# Patient Record
Sex: Female | Born: 1935
Health system: Southern US, Community
[De-identification: ages and names within clinical notes are randomized; demographics above are authoritative.]

## PROBLEM LIST (undated history)

## (undated) DIAGNOSIS — Z8042 Family history of malignant neoplasm of prostate: Secondary | ICD-10-CM

## (undated) DIAGNOSIS — IMO0001 Reserved for inherently not codable concepts without codable children: Secondary | ICD-10-CM

## (undated) DIAGNOSIS — K274 Chronic or unspecified peptic ulcer, site unspecified, with hemorrhage: Secondary | ICD-10-CM

## (undated) DIAGNOSIS — M1990 Unspecified osteoarthritis, unspecified site: Secondary | ICD-10-CM

## (undated) DIAGNOSIS — C801 Malignant (primary) neoplasm, unspecified: Secondary | ICD-10-CM

## (undated) DIAGNOSIS — I10 Essential (primary) hypertension: Secondary | ICD-10-CM

## (undated) DIAGNOSIS — K284 Chronic or unspecified gastrojejunal ulcer with hemorrhage: Secondary | ICD-10-CM

## (undated) DIAGNOSIS — J189 Pneumonia, unspecified organism: Secondary | ICD-10-CM

## (undated) DIAGNOSIS — D649 Anemia, unspecified: Secondary | ICD-10-CM

## (undated) DIAGNOSIS — Z8489 Family history of other specified conditions: Secondary | ICD-10-CM

## (undated) DIAGNOSIS — Z803 Family history of malignant neoplasm of breast: Secondary | ICD-10-CM

## (undated) DIAGNOSIS — K219 Gastro-esophageal reflux disease without esophagitis: Secondary | ICD-10-CM

## (undated) DIAGNOSIS — C50919 Malignant neoplasm of unspecified site of unspecified female breast: Secondary | ICD-10-CM

## (undated) DIAGNOSIS — J45909 Unspecified asthma, uncomplicated: Secondary | ICD-10-CM

## (undated) DIAGNOSIS — C50411 Malignant neoplasm of upper-outer quadrant of right female breast: Secondary | ICD-10-CM

## (undated) DIAGNOSIS — A310 Pulmonary mycobacterial infection: Secondary | ICD-10-CM

## (undated) HISTORY — DX: Malignant neoplasm of unspecified site of unspecified female breast: C50.919

## (undated) HISTORY — PX: APPENDECTOMY: SHX54

## (undated) HISTORY — PX: ROTATOR CUFF REPAIR: SHX139

## (undated) HISTORY — DX: Family history of malignant neoplasm of breast: Z80.3

## (undated) HISTORY — PX: KNEE SURGERY: SHX244

## (undated) HISTORY — PX: ABDOMINAL HYSTERECTOMY: SHX81

## (undated) HISTORY — DX: Malignant neoplasm of upper-outer quadrant of right female breast: C50.411

## (undated) HISTORY — DX: Family history of malignant neoplasm of prostate: Z80.42

---

## 2009-04-15 ENCOUNTER — Ambulatory Visit (HOSPITAL_COMMUNITY)
Admission: RE | Admit: 2009-04-15 | Discharge: 2009-04-15 | Payer: Self-pay | Admitting: Physical Medicine and Rehabilitation

## 2009-04-15 ENCOUNTER — Encounter (INDEPENDENT_AMBULATORY_CARE_PROVIDER_SITE_OTHER): Payer: Self-pay | Admitting: Physical Medicine and Rehabilitation

## 2009-04-15 ENCOUNTER — Ambulatory Visit: Payer: Self-pay | Admitting: *Deleted

## 2010-02-17 ENCOUNTER — Ambulatory Visit (HOSPITAL_BASED_OUTPATIENT_CLINIC_OR_DEPARTMENT_OTHER): Admission: RE | Admit: 2010-02-17 | Discharge: 2010-02-17 | Payer: Self-pay | Admitting: Orthopedic Surgery

## 2010-12-13 LAB — POCT I-STAT 4, (NA,K, GLUC, HGB,HCT)
Glucose, Bld: 91 mg/dL (ref 70–99)
HCT: 41 % (ref 36.0–46.0)

## 2011-09-29 DIAGNOSIS — J410 Simple chronic bronchitis: Secondary | ICD-10-CM | POA: Diagnosis not present

## 2011-09-29 DIAGNOSIS — R5383 Other fatigue: Secondary | ICD-10-CM | POA: Diagnosis not present

## 2011-09-29 DIAGNOSIS — R5381 Other malaise: Secondary | ICD-10-CM | POA: Diagnosis not present

## 2011-12-26 DIAGNOSIS — H2510 Age-related nuclear cataract, unspecified eye: Secondary | ICD-10-CM | POA: Diagnosis not present

## 2012-01-09 DIAGNOSIS — Z1231 Encounter for screening mammogram for malignant neoplasm of breast: Secondary | ICD-10-CM | POA: Diagnosis not present

## 2012-01-31 DIAGNOSIS — J329 Chronic sinusitis, unspecified: Secondary | ICD-10-CM | POA: Diagnosis not present

## 2012-01-31 DIAGNOSIS — I10 Essential (primary) hypertension: Secondary | ICD-10-CM | POA: Diagnosis not present

## 2012-01-31 DIAGNOSIS — J309 Allergic rhinitis, unspecified: Secondary | ICD-10-CM | POA: Diagnosis not present

## 2012-02-20 DIAGNOSIS — H1030 Unspecified acute conjunctivitis, unspecified eye: Secondary | ICD-10-CM | POA: Diagnosis not present

## 2012-03-07 DIAGNOSIS — T148 Other injury of unspecified body region: Secondary | ICD-10-CM | POA: Diagnosis not present

## 2012-03-07 DIAGNOSIS — W57XXXA Bitten or stung by nonvenomous insect and other nonvenomous arthropods, initial encounter: Secondary | ICD-10-CM | POA: Diagnosis not present

## 2012-06-27 DIAGNOSIS — J209 Acute bronchitis, unspecified: Secondary | ICD-10-CM | POA: Diagnosis not present

## 2012-07-12 DIAGNOSIS — R062 Wheezing: Secondary | ICD-10-CM | POA: Diagnosis not present

## 2012-07-12 DIAGNOSIS — H5310 Unspecified subjective visual disturbances: Secondary | ICD-10-CM | POA: Diagnosis not present

## 2012-07-12 DIAGNOSIS — Z1331 Encounter for screening for depression: Secondary | ICD-10-CM | POA: Diagnosis not present

## 2012-07-12 DIAGNOSIS — Z23 Encounter for immunization: Secondary | ICD-10-CM | POA: Diagnosis not present

## 2012-07-23 DIAGNOSIS — H25019 Cortical age-related cataract, unspecified eye: Secondary | ICD-10-CM | POA: Diagnosis not present

## 2012-07-23 DIAGNOSIS — H2510 Age-related nuclear cataract, unspecified eye: Secondary | ICD-10-CM | POA: Diagnosis not present

## 2012-07-23 DIAGNOSIS — H5200 Hypermetropia, unspecified eye: Secondary | ICD-10-CM | POA: Diagnosis not present

## 2012-07-23 DIAGNOSIS — H40019 Open angle with borderline findings, low risk, unspecified eye: Secondary | ICD-10-CM | POA: Diagnosis not present

## 2012-08-31 DIAGNOSIS — Z23 Encounter for immunization: Secondary | ICD-10-CM | POA: Diagnosis not present

## 2012-08-31 DIAGNOSIS — I10 Essential (primary) hypertension: Secondary | ICD-10-CM | POA: Diagnosis not present

## 2012-08-31 DIAGNOSIS — K219 Gastro-esophageal reflux disease without esophagitis: Secondary | ICD-10-CM | POA: Diagnosis not present

## 2012-08-31 DIAGNOSIS — Z0000 Encounter for general adult medical examination without abnormal findings: Secondary | ICD-10-CM | POA: Diagnosis not present

## 2012-09-13 DIAGNOSIS — Z1212 Encounter for screening for malignant neoplasm of rectum: Secondary | ICD-10-CM | POA: Diagnosis not present

## 2012-09-26 HISTORY — PX: EYE SURGERY: SHX253

## 2012-11-01 DIAGNOSIS — L820 Inflamed seborrheic keratosis: Secondary | ICD-10-CM | POA: Diagnosis not present

## 2012-11-12 DIAGNOSIS — H60509 Unspecified acute noninfective otitis externa, unspecified ear: Secondary | ICD-10-CM | POA: Diagnosis not present

## 2012-12-05 DIAGNOSIS — R319 Hematuria, unspecified: Secondary | ICD-10-CM | POA: Diagnosis not present

## 2012-12-14 DIAGNOSIS — H25019 Cortical age-related cataract, unspecified eye: Secondary | ICD-10-CM | POA: Diagnosis not present

## 2012-12-14 DIAGNOSIS — H40019 Open angle with borderline findings, low risk, unspecified eye: Secondary | ICD-10-CM | POA: Diagnosis not present

## 2012-12-14 DIAGNOSIS — H2510 Age-related nuclear cataract, unspecified eye: Secondary | ICD-10-CM | POA: Diagnosis not present

## 2013-01-02 DIAGNOSIS — H2510 Age-related nuclear cataract, unspecified eye: Secondary | ICD-10-CM | POA: Diagnosis not present

## 2013-01-02 DIAGNOSIS — H2589 Other age-related cataract: Secondary | ICD-10-CM | POA: Diagnosis not present

## 2013-01-02 DIAGNOSIS — H25019 Cortical age-related cataract, unspecified eye: Secondary | ICD-10-CM | POA: Diagnosis not present

## 2013-01-05 DIAGNOSIS — IMO0002 Reserved for concepts with insufficient information to code with codable children: Secondary | ICD-10-CM | POA: Diagnosis not present

## 2013-01-09 DIAGNOSIS — H2510 Age-related nuclear cataract, unspecified eye: Secondary | ICD-10-CM | POA: Diagnosis not present

## 2013-01-09 DIAGNOSIS — H2589 Other age-related cataract: Secondary | ICD-10-CM | POA: Diagnosis not present

## 2013-01-09 DIAGNOSIS — H25019 Cortical age-related cataract, unspecified eye: Secondary | ICD-10-CM | POA: Diagnosis not present

## 2013-01-14 DIAGNOSIS — Z1231 Encounter for screening mammogram for malignant neoplasm of breast: Secondary | ICD-10-CM | POA: Diagnosis not present

## 2013-02-13 DIAGNOSIS — H9210 Otorrhea, unspecified ear: Secondary | ICD-10-CM | POA: Diagnosis not present

## 2013-02-13 DIAGNOSIS — H903 Sensorineural hearing loss, bilateral: Secondary | ICD-10-CM | POA: Diagnosis not present

## 2013-02-13 DIAGNOSIS — H60509 Unspecified acute noninfective otitis externa, unspecified ear: Secondary | ICD-10-CM | POA: Diagnosis not present

## 2013-02-22 DIAGNOSIS — H9210 Otorrhea, unspecified ear: Secondary | ICD-10-CM | POA: Diagnosis not present

## 2013-02-22 DIAGNOSIS — H60509 Unspecified acute noninfective otitis externa, unspecified ear: Secondary | ICD-10-CM | POA: Diagnosis not present

## 2013-02-28 DIAGNOSIS — H40019 Open angle with borderline findings, low risk, unspecified eye: Secondary | ICD-10-CM | POA: Diagnosis not present

## 2013-02-28 DIAGNOSIS — H532 Diplopia: Secondary | ICD-10-CM | POA: Diagnosis not present

## 2013-03-07 DIAGNOSIS — M25569 Pain in unspecified knee: Secondary | ICD-10-CM | POA: Diagnosis not present

## 2013-03-07 DIAGNOSIS — I10 Essential (primary) hypertension: Secondary | ICD-10-CM | POA: Diagnosis not present

## 2013-03-07 DIAGNOSIS — R5381 Other malaise: Secondary | ICD-10-CM | POA: Diagnosis not present

## 2013-03-07 DIAGNOSIS — Z6835 Body mass index (BMI) 35.0-35.9, adult: Secondary | ICD-10-CM | POA: Diagnosis not present

## 2013-03-07 DIAGNOSIS — F4321 Adjustment disorder with depressed mood: Secondary | ICD-10-CM | POA: Diagnosis not present

## 2013-03-07 DIAGNOSIS — K219 Gastro-esophageal reflux disease without esophagitis: Secondary | ICD-10-CM | POA: Diagnosis not present

## 2013-05-06 DIAGNOSIS — J309 Allergic rhinitis, unspecified: Secondary | ICD-10-CM | POA: Diagnosis not present

## 2013-05-06 DIAGNOSIS — J209 Acute bronchitis, unspecified: Secondary | ICD-10-CM | POA: Diagnosis not present

## 2013-07-08 DIAGNOSIS — Z23 Encounter for immunization: Secondary | ICD-10-CM | POA: Diagnosis not present

## 2013-07-16 DIAGNOSIS — Z1331 Encounter for screening for depression: Secondary | ICD-10-CM | POA: Diagnosis not present

## 2013-07-16 DIAGNOSIS — Z6834 Body mass index (BMI) 34.0-34.9, adult: Secondary | ICD-10-CM | POA: Diagnosis not present

## 2013-07-16 DIAGNOSIS — F411 Generalized anxiety disorder: Secondary | ICD-10-CM | POA: Diagnosis not present

## 2013-07-16 DIAGNOSIS — H60399 Other infective otitis externa, unspecified ear: Secondary | ICD-10-CM | POA: Diagnosis not present

## 2013-07-16 DIAGNOSIS — M1990 Unspecified osteoarthritis, unspecified site: Secondary | ICD-10-CM | POA: Diagnosis not present

## 2013-07-16 DIAGNOSIS — R634 Abnormal weight loss: Secondary | ICD-10-CM | POA: Diagnosis not present

## 2013-07-16 DIAGNOSIS — I10 Essential (primary) hypertension: Secondary | ICD-10-CM | POA: Diagnosis not present

## 2013-09-03 DIAGNOSIS — M1710 Unilateral primary osteoarthritis, unspecified knee: Secondary | ICD-10-CM | POA: Diagnosis not present

## 2013-09-12 DIAGNOSIS — L981 Factitial dermatitis: Secondary | ICD-10-CM | POA: Diagnosis not present

## 2013-09-12 DIAGNOSIS — L219 Seborrheic dermatitis, unspecified: Secondary | ICD-10-CM | POA: Diagnosis not present

## 2013-10-02 DIAGNOSIS — I10 Essential (primary) hypertension: Secondary | ICD-10-CM | POA: Diagnosis not present

## 2013-10-15 DIAGNOSIS — M1710 Unilateral primary osteoarthritis, unspecified knee: Secondary | ICD-10-CM | POA: Diagnosis not present

## 2013-10-15 DIAGNOSIS — IMO0002 Reserved for concepts with insufficient information to code with codable children: Secondary | ICD-10-CM | POA: Diagnosis not present

## 2013-10-17 DIAGNOSIS — L719 Rosacea, unspecified: Secondary | ICD-10-CM | POA: Diagnosis not present

## 2013-10-17 DIAGNOSIS — L820 Inflamed seborrheic keratosis: Secondary | ICD-10-CM | POA: Diagnosis not present

## 2013-10-24 DIAGNOSIS — Z1212 Encounter for screening for malignant neoplasm of rectum: Secondary | ICD-10-CM | POA: Diagnosis not present

## 2013-10-24 DIAGNOSIS — IMO0002 Reserved for concepts with insufficient information to code with codable children: Secondary | ICD-10-CM | POA: Diagnosis not present

## 2013-10-24 DIAGNOSIS — M1710 Unilateral primary osteoarthritis, unspecified knee: Secondary | ICD-10-CM | POA: Diagnosis not present

## 2013-10-28 DIAGNOSIS — Z0000 Encounter for general adult medical examination without abnormal findings: Secondary | ICD-10-CM | POA: Diagnosis not present

## 2013-10-28 DIAGNOSIS — R5381 Other malaise: Secondary | ICD-10-CM | POA: Diagnosis not present

## 2013-10-28 DIAGNOSIS — F4321 Adjustment disorder with depressed mood: Secondary | ICD-10-CM | POA: Diagnosis not present

## 2013-10-28 DIAGNOSIS — K219 Gastro-esophageal reflux disease without esophagitis: Secondary | ICD-10-CM | POA: Diagnosis not present

## 2013-10-28 DIAGNOSIS — R634 Abnormal weight loss: Secondary | ICD-10-CM | POA: Diagnosis not present

## 2013-10-28 DIAGNOSIS — I10 Essential (primary) hypertension: Secondary | ICD-10-CM | POA: Diagnosis not present

## 2013-10-28 DIAGNOSIS — E46 Unspecified protein-calorie malnutrition: Secondary | ICD-10-CM | POA: Diagnosis not present

## 2013-10-28 DIAGNOSIS — R319 Hematuria, unspecified: Secondary | ICD-10-CM | POA: Diagnosis not present

## 2013-10-28 DIAGNOSIS — R5383 Other fatigue: Secondary | ICD-10-CM | POA: Diagnosis not present

## 2013-10-28 DIAGNOSIS — Z23 Encounter for immunization: Secondary | ICD-10-CM | POA: Diagnosis not present

## 2013-10-31 DIAGNOSIS — IMO0002 Reserved for concepts with insufficient information to code with codable children: Secondary | ICD-10-CM | POA: Diagnosis not present

## 2013-10-31 DIAGNOSIS — M1710 Unilateral primary osteoarthritis, unspecified knee: Secondary | ICD-10-CM | POA: Diagnosis not present

## 2013-12-08 DIAGNOSIS — J019 Acute sinusitis, unspecified: Secondary | ICD-10-CM | POA: Diagnosis not present

## 2013-12-08 DIAGNOSIS — J209 Acute bronchitis, unspecified: Secondary | ICD-10-CM | POA: Diagnosis not present

## 2014-01-16 DIAGNOSIS — L819 Disorder of pigmentation, unspecified: Secondary | ICD-10-CM | POA: Diagnosis not present

## 2014-01-16 DIAGNOSIS — L821 Other seborrheic keratosis: Secondary | ICD-10-CM | POA: Diagnosis not present

## 2014-01-27 DIAGNOSIS — A938 Other specified arthropod-borne viral fevers: Secondary | ICD-10-CM | POA: Diagnosis not present

## 2014-01-30 DIAGNOSIS — Z1231 Encounter for screening mammogram for malignant neoplasm of breast: Secondary | ICD-10-CM | POA: Diagnosis not present

## 2014-02-07 DIAGNOSIS — M1710 Unilateral primary osteoarthritis, unspecified knee: Secondary | ICD-10-CM | POA: Diagnosis not present

## 2014-03-10 DIAGNOSIS — H3530 Unspecified macular degeneration: Secondary | ICD-10-CM | POA: Diagnosis not present

## 2014-03-10 DIAGNOSIS — H40019 Open angle with borderline findings, low risk, unspecified eye: Secondary | ICD-10-CM | POA: Diagnosis not present

## 2014-03-10 DIAGNOSIS — H43819 Vitreous degeneration, unspecified eye: Secondary | ICD-10-CM | POA: Diagnosis not present

## 2014-03-10 DIAGNOSIS — H04129 Dry eye syndrome of unspecified lacrimal gland: Secondary | ICD-10-CM | POA: Diagnosis not present

## 2014-04-16 DIAGNOSIS — M1710 Unilateral primary osteoarthritis, unspecified knee: Secondary | ICD-10-CM | POA: Diagnosis not present

## 2014-04-22 DIAGNOSIS — Z85828 Personal history of other malignant neoplasm of skin: Secondary | ICD-10-CM | POA: Diagnosis not present

## 2014-04-22 DIAGNOSIS — L821 Other seborrheic keratosis: Secondary | ICD-10-CM | POA: Diagnosis not present

## 2014-04-24 ENCOUNTER — Other Ambulatory Visit: Payer: Self-pay | Admitting: Orthopedic Surgery

## 2014-05-12 ENCOUNTER — Emergency Department (HOSPITAL_COMMUNITY): Payer: No Typology Code available for payment source

## 2014-05-12 ENCOUNTER — Encounter (HOSPITAL_COMMUNITY): Payer: Self-pay | Admitting: Emergency Medicine

## 2014-05-12 ENCOUNTER — Emergency Department (HOSPITAL_COMMUNITY)
Admission: EM | Admit: 2014-05-12 | Discharge: 2014-05-12 | Disposition: A | Discharge status: Home / Self Care | Payer: No Typology Code available for payment source | Attending: Emergency Medicine | Admitting: Emergency Medicine

## 2014-05-12 ENCOUNTER — Other Ambulatory Visit (HOSPITAL_COMMUNITY): Payer: Self-pay

## 2014-05-12 DIAGNOSIS — Y9389 Activity, other specified: Secondary | ICD-10-CM | POA: Insufficient documentation

## 2014-05-12 DIAGNOSIS — S8990XA Unspecified injury of unspecified lower leg, initial encounter: Secondary | ICD-10-CM | POA: Insufficient documentation

## 2014-05-12 DIAGNOSIS — Y92410 Unspecified street and highway as the place of occurrence of the external cause: Secondary | ICD-10-CM | POA: Insufficient documentation

## 2014-05-12 DIAGNOSIS — S0993XA Unspecified injury of face, initial encounter: Secondary | ICD-10-CM | POA: Insufficient documentation

## 2014-05-12 DIAGNOSIS — S161XXA Strain of muscle, fascia and tendon at neck level, initial encounter: Secondary | ICD-10-CM

## 2014-05-12 DIAGNOSIS — Z79899 Other long term (current) drug therapy: Secondary | ICD-10-CM | POA: Insufficient documentation

## 2014-05-12 DIAGNOSIS — I10 Essential (primary) hypertension: Secondary | ICD-10-CM | POA: Insufficient documentation

## 2014-05-12 DIAGNOSIS — S99919A Unspecified injury of unspecified ankle, initial encounter: Secondary | ICD-10-CM

## 2014-05-12 DIAGNOSIS — S199XXA Unspecified injury of neck, initial encounter: Secondary | ICD-10-CM

## 2014-05-12 DIAGNOSIS — S99929A Unspecified injury of unspecified foot, initial encounter: Secondary | ICD-10-CM

## 2014-05-12 DIAGNOSIS — M25569 Pain in unspecified knee: Secondary | ICD-10-CM | POA: Diagnosis not present

## 2014-05-12 DIAGNOSIS — S0990XA Unspecified injury of head, initial encounter: Secondary | ICD-10-CM | POA: Diagnosis not present

## 2014-05-12 DIAGNOSIS — K219 Gastro-esophageal reflux disease without esophagitis: Secondary | ICD-10-CM | POA: Insufficient documentation

## 2014-05-12 DIAGNOSIS — S139XXA Sprain of joints and ligaments of unspecified parts of neck, initial encounter: Secondary | ICD-10-CM | POA: Insufficient documentation

## 2014-05-12 DIAGNOSIS — R51 Headache: Secondary | ICD-10-CM | POA: Diagnosis not present

## 2014-05-12 DIAGNOSIS — IMO0002 Reserved for concepts with insufficient information to code with codable children: Secondary | ICD-10-CM | POA: Insufficient documentation

## 2014-05-12 HISTORY — DX: Gastro-esophageal reflux disease without esophagitis: K21.9

## 2014-05-12 HISTORY — DX: Essential (primary) hypertension: I10
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
## 2014-05-13 ENCOUNTER — Encounter (HOSPITAL_COMMUNITY)
Admission: RE | Admit: 2014-05-13 | Discharge: 2014-05-13 | Disposition: A | Discharge status: Home / Self Care | Payer: Medicare Other | Source: Ambulatory Visit | Attending: Orthopedic Surgery | Admitting: Orthopedic Surgery

## 2014-05-13 ENCOUNTER — Ambulatory Visit
Admission: RE | Admit: 2014-05-13 | Discharge: 2014-05-13 | Disposition: A | Discharge status: Home / Self Care | Payer: Medicare Other | Source: Ambulatory Visit | Attending: Internal Medicine | Admitting: Internal Medicine

## 2014-05-13 ENCOUNTER — Encounter (HOSPITAL_COMMUNITY): Payer: Self-pay | Admitting: Pharmacy Technician

## 2014-05-13 ENCOUNTER — Other Ambulatory Visit: Payer: Self-pay | Admitting: Internal Medicine

## 2014-05-13 ENCOUNTER — Encounter (HOSPITAL_COMMUNITY): Payer: Self-pay

## 2014-05-13 DIAGNOSIS — R911 Solitary pulmonary nodule: Secondary | ICD-10-CM

## 2014-05-13 DIAGNOSIS — M1710 Unilateral primary osteoarthritis, unspecified knee: Secondary | ICD-10-CM | POA: Insufficient documentation

## 2014-05-13 DIAGNOSIS — Z01818 Encounter for other preprocedural examination: Secondary | ICD-10-CM | POA: Diagnosis not present

## 2014-05-13 DIAGNOSIS — J479 Bronchiectasis, uncomplicated: Secondary | ICD-10-CM | POA: Diagnosis not present

## 2014-05-13 HISTORY — DX: Unspecified osteoarthritis, unspecified site: M19.90

## 2014-05-13 HISTORY — DX: Malignant (primary) neoplasm, unspecified: C80.1

## 2014-05-13 HISTORY — DX: Unspecified asthma, uncomplicated: J45.909

## 2014-05-13 LAB — COMPREHENSIVE METABOLIC PANEL
ALBUMIN: 3.9 g/dL (ref 3.5–5.2)
ALK PHOS: 119 U/L — AB (ref 39–117)
ALT: 16 U/L (ref 0–35)
AST: 23 U/L (ref 0–37)
Anion gap: 13 (ref 5–15)
BUN: 22 mg/dL (ref 6–23)
CO2: 27 mEq/L (ref 19–32)
Calcium: 9.7 mg/dL (ref 8.4–10.5)
Chloride: 98 mEq/L (ref 96–112)
Creatinine, Ser: 0.76 mg/dL (ref 0.50–1.10)
GFR calc Af Amer: 86 mL/min — ABNORMAL LOW (ref >90)
GFR calc non Af Amer: 74 mL/min — ABNORMAL LOW (ref >90)
Glucose, Bld: 98 mg/dL (ref 70–99)
POTASSIUM: 4 meq/L (ref 3.7–5.3)
SODIUM: 138 meq/L (ref 137–147)
Total Bilirubin: 0.4 mg/dL (ref 0.3–1.2)
Total Protein: 7.4 g/dL (ref 6.0–8.3)

## 2014-05-13 LAB — CBC
HCT: 41.7 % (ref 36.0–46.0)
Hemoglobin: 13.6 g/dL (ref 12.0–15.0)
MCH: 32.9 pg (ref 26.0–34.0)
MCHC: 32.6 g/dL (ref 30.0–36.0)
MCV: 100.7 fL — ABNORMAL HIGH (ref 78.0–100.0)
Platelets: 409 10*3/uL — ABNORMAL HIGH (ref 150–400)
RBC: 4.14 MIL/uL (ref 3.87–5.11)
RDW: 13.6 % (ref 11.5–15.5)
WBC: 11 10*3/uL — ABNORMAL HIGH (ref 4.0–10.5)

## 2014-05-13 LAB — URINALYSIS, ROUTINE W REFLEX MICROSCOPIC
BILIRUBIN URINE: NEGATIVE
Glucose, UA: NEGATIVE mg/dL
Hgb urine dipstick: NEGATIVE
Ketones, ur: NEGATIVE mg/dL
Leukocytes, UA: NEGATIVE
Nitrite: NEGATIVE
Protein, ur: NEGATIVE mg/dL
Specific Gravity, Urine: 1.014 (ref 1.005–1.030)
UROBILINOGEN UA: 0.2 mg/dL (ref 0.0–1.0)
pH: 5.5 (ref 5.0–8.0)

## 2014-05-13 LAB — SURGICAL PCR SCREEN
MRSA, PCR: POSITIVE — AB
Staphylococcus aureus: POSITIVE — AB

## 2014-05-13 LAB — PROTIME-INR
INR: 1.08 (ref 0.00–1.49)
Prothrombin Time: 14 seconds (ref 11.6–15.2)

## 2014-05-13 LAB — ABO/RH: ABO/RH(D): O POS

## 2014-05-13 LAB — APTT: aPTT: 38 seconds — ABNORMAL HIGH (ref 24–37)

## 2014-05-13 MED ORDER — IOHEXOL 300 MG/ML  SOLN
75.0000 mL | Freq: Once | INTRAMUSCULAR | Status: AC | PRN
  Administered 2014-05-13: 75 mL via INTRAVENOUS

## 2014-05-13 NOTE — Patient Instructions (Addendum)
YOUR SURGERY IS SCHEDULED AT [facility]  ON:  Monday  8/24  REPORT TO  SHORT STAY CENTER AT:  10:45 AM     PLEASE COME IN THE [place] ENTRANCE AND FOLLOW SIGNS TO SHORT STAY CENTER.  DO NOT EAT  ANYTHING AFTER MIDNIGHT THE NIGHT BEFORE YOUR SURGERY.  NO FOOD, NO CHEWING GUM, NO MINTS, NO CANDIES, NO CHEWING TOBACCO. YOU MAY HAVE CLEAR LIQUIDS TO DRINK FROM MIDNIGHT UNTIL 7:30 AM DAY OF YOUR SURGERY - LIKE WATER, GATORADE, APPLE JUICE.   NOTHING TO DRINK AFTER 7:30 AM DAY OF SURGERY.  PLEASE TAKE THE FOLLOWING MEDICATIONS THE AM OF YOUR SURGERY WITH A FEW SIPS OF WATER:  [place], [place].  USE ALBUTEROL INHALER, AND FLONASE NASAL SPRAY   DO NOT BRING VALUABLES, MONEY, CREDIT CARDS.  DO NOT WEAR JEWELRY, MAKE-UP, NAIL POLISH AND NO METAL PINS OR CLIPS IN YOUR HAIR. CONTACT LENS, DENTURES / PARTIALS, GLASSES SHOULD NOT BE WORN TO SURGERY AND IN MOST CASES-HEARING AIDS WILL NEED TO BE REMOVED.  BRING YOUR GLASSES CASE, ANY EQUIPMENT NEEDED FOR YOUR CONTACT LENS. FOR PATIENTS ADMITTED TO THE HOSPITAL--CHECK OUT TIME THE DAY OF DISCHARGE IS 11:00 AM.  ALL INPATIENT ROOMS ARE PRIVATE - WITH BATHROOM, TELEPHONE, TELEVISION AND WIFI INTERNET.                                                    PLEASE READ OVER ANY  FACT SHEETS THAT YOU WERE GIVEN: MRSA INFORMATION, BLOOD TRANSFUSION INFORMATION, INCENTIVE SPIROMETER INFORMATION.  PLEASE BE AWARE THAT YOU MAY NEED ADDITIONAL BLOOD DRAWN DAY OF YOUR SURGERY  _______________________________________________________________________   [facility] - Preparing for Surgery Before surgery, you can play an important role.  Because skin is not sterile, your skin needs to be as free of germs as possible.  You can reduce the number of germs on your skin by washing with CHG (chlorahexidine gluconate) soap before surgery.  CHG is an antiseptic cleaner which kills germs and bonds with the skin to continue killing germs even after  washing. Please DO NOT use if you have an allergy to CHG or antibacterial soaps.  If your skin becomes reddened/irritated stop using the CHG and inform your nurse when you arrive at Short Stay. Do not shave (including legs and underarms) for at least 48 hours prior to the first CHG shower.  You may shave your face/neck. Please follow these instructions carefully:  1.  Shower with CHG Soap the night before surgery and the  morning of Surgery.  2.  If you choose to wash your hair, wash your hair first as usual with your  normal  shampoo.  3.  After you shampoo, rinse your hair and body thoroughly to remove the  shampoo.                           4.  Use CHG as you would any other liquid soap.  You can apply chg directly  to the skin and wash                       Gently with a scrungie or clean washcloth.  5.  Apply the CHG Soap to your body ONLY FROM THE NECK DOWN.   Do not use  on face/ open                           Wound or open sores. Avoid contact with eyes, ears mouth and genitals (private parts).                       Wash face,  Genitals (private parts) with your normal soap.             6.  Wash thoroughly, paying special attention to the area where your surgery  will be performed.  7.  Thoroughly rinse your body with warm water from the neck down.  8.  DO NOT shower/wash with your normal soap after using and rinsing off  the CHG Soap.                9.  Pat yourself dry with a clean towel.            10.  Wear clean pajamas.            11.  Place clean sheets on your bed the night of your first shower and do not  sleep with pets. Day of Surgery : Do not apply any lotions/deodorants the morning of surgery.  Please wear clean clothes to the hospital/surgery center.  FAILURE TO FOLLOW THESE INSTRUCTIONS MAY RESULT IN THE CANCELLATION OF YOUR SURGERY PATIENT SIGNATURE_________________________________  NURSE  SIGNATURE__________________________________  ________________________________________________________________________   Carla [name]  An incentive spirometer is a tool that can help keep your lungs clear and active. This tool measures how well you are filling your lungs with each breath. Taking long deep breaths may help reverse or decrease the chance of developing breathing (pulmonary) problems (especially infection) following:  A long period of time when you are unable to move or be active. BEFORE THE PROCEDURE   If the spirometer includes an indicator to show your best effort, your nurse or respiratory therapist will set it to a desired goal.  If possible, sit up straight or lean slightly forward. Try not to slouch.  Hold the incentive spirometer in an upright position. INSTRUCTIONS FOR USE  1. Sit on the edge of your bed if possible, or sit up as far as you can in bed or on a chair. 2. Hold the incentive spirometer in an upright position. 3. Breathe out normally. 4. Place the mouthpiece in your mouth and seal your lips tightly around it. 5. Breathe in slowly and as deeply as possible, raising the piston or the ball toward the top of the column. 6. Hold your breath for 3-5 seconds or for as long as possible. Allow the piston or ball to fall to the bottom of the column. 7. Remove the mouthpiece from your mouth and breathe out normally. 8. Rest for a few seconds and repeat Steps 1 through 7 at least 10 times every 1-2 hours when you are awake. Take your time and take a few normal breaths between deep breaths. 9. The spirometer may include an indicator to show your best effort. Use the indicator as a goal to work toward during each repetition. 10. After each set of 10 deep breaths, practice coughing to be sure your lungs are clear. If you have an incision (the cut made at the time of surgery), support your incision when coughing by placing a pillow or rolled up towels firmly  against it. Once you are able to get out of bed, walk  around indoors and cough well. You may stop using the incentive spirometer when instructed by your caregiver.  RISKS AND COMPLICATIONS  Take your time so you do not get dizzy or light-headed.  If you are in pain, you may need to take or ask for pain medication before doing incentive spirometry. It is harder to take a deep breath if you are having pain. AFTER USE  Rest and breathe slowly and easily.  It can be helpful to keep track of a log of your progress. Your caregiver can provide you with a simple table to help with this. If you are using the spirometer at home, follow these instructions: [place] IF:   You are having difficultly using the spirometer.  You have trouble using the spirometer as often as instructed.  Your pain medication is not giving enough relief while using the spirometer.  You develop fever of 100.5 F (38.1 C) or higher. SEEK IMMEDIATE MEDICAL CARE IF:   You cough up bloody sputum that had not been present before.  You develop fever of 102 F (38.9 C) or greater.  You develop worsening pain at or near the incision site. MAKE SURE YOU:   Understand these instructions.  Will watch your condition.  Will get help right away if you are not doing well or get worse. Document Released: 01/23/2007 Document Revised: 12/05/2011 Document Reviewed: 03/26/2007 ExitCare Patient Information 2014 Exit[employer], [place].   ________________________________________________________________________  WHAT IS A BLOOD TRANSFUSION? Blood Transfusion Information  A transfusion is the replacement of blood or some of its parts. Blood is made up of multiple cells which provide different functions.  Red blood cells carry oxygen and are used for blood loss replacement.  White blood cells fight against infection.  Platelets control bleeding.  Plasma helps clot blood.  Other blood products are available for  specialized needs, such as hemophilia or other clotting disorders. BEFORE THE TRANSFUSION  Who gives blood for transfusions?   Healthy volunteers who are fully evaluated to make sure their blood is safe. This is blood bank blood. Transfusion therapy is the safest it has ever been in the practice of medicine. Before blood is taken from a donor, a complete history is taken to make sure that person has no history of diseases nor engages in risky social behavior (examples are intravenous drug use or sexual activity with multiple partners). The donor's travel history is screened to minimize risk of transmitting infections, such as malaria. The donated blood is tested for signs of infectious diseases, such as HIV and hepatitis. The blood is then tested to be sure it is compatible with you in order to minimize the chance of a transfusion reaction. If you or a relative donates blood, this is often done in anticipation of surgery and is not appropriate for emergency situations. It takes many days to process the donated blood. RISKS AND COMPLICATIONS Although transfusion therapy is very safe and saves many lives, the main dangers of transfusion include:   Getting an infectious disease.  Developing a transfusion reaction. This is an allergic reaction to something in the blood you were given. Every precaution is taken to prevent this. The decision to have a blood transfusion has been considered carefully by your caregiver before blood is given. Blood is not given unless the benefits outweigh the risks. AFTER THE TRANSFUSION  Right after receiving a blood transfusion, you will usually feel much better and more energetic. This is especially true if your red blood cells have gotten  low (anemic). The transfusion raises the level of the red blood cells which carry oxygen, and this usually causes an energy increase.  The nurse administering the transfusion will monitor you carefully for complications. HOME CARE  INSTRUCTIONS  No special instructions are needed after a transfusion. You may find your energy is better. Speak with your caregiver about any limitations on activity for underlying diseases you may have. SEEK MEDICAL CARE IF:   Your condition is not improving after your transfusion.  You develop redness or irritation at the intravenous (IV) site. SEEK IMMEDIATE MEDICAL CARE IF:  Any of the following symptoms occur over the next 12 hours:  Shaking chills.  You have a temperature by mouth above 102 F (38.9 C), not controlled by medicine.  Chest, back, or muscle pain.  People around you feel you are not acting correctly or are confused.  Shortness of breath or difficulty breathing.  Dizziness and fainting.  You get a rash or develop hives.  You have a decrease in urine output.  Your urine turns a dark color or changes to pink, red, or [place]. Any of the following symptoms occur over the next 10 days:  You have a temperature by mouth above 102 F (38.9 C), not controlled by medicine.  Shortness of breath.  Weakness after normal activity.  The white part of the eye turns yellow (jaundice).  You have a decrease in the amount of urine or are urinating less often.  Your urine turns a dark color or changes to pink, red, or [place]. Document Released: 09/09/2000 Document Revised: 12/05/2011 Document Reviewed: 04/28/2008 [facility] Patient Information 2014 [place], [place].  _______________________________________________________________________
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
## 2014-05-13 NOTE — Pre-Procedure Instructions (Signed)
EKG ON PT'S CHART FROM 10/28/13 [facility] AND OFFICE NOTE ON CHART FROM 05/05/14 WITH  INTERPRETATION OF THE EKG DONE 10/28/13 FROM DR. HOLWERDA. PT HAVING CT OF CHEST THIS AFTERNOON AT [facility].

## 2014-05-14 ENCOUNTER — Other Ambulatory Visit: Payer: Self-pay | Admitting: Surgical

## 2014-05-14 MED ORDER — TRANEXAMIC ACID 100 MG/ML IV SOLN: 1000.0000 mg | INTRAVENOUS | Status: AC
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
## 2014-05-19 ENCOUNTER — Encounter (HOSPITAL_COMMUNITY): Payer: Self-pay | Admitting: *Deleted

## 2014-05-19 ENCOUNTER — Encounter (HOSPITAL_COMMUNITY)
Admission: RE | Disposition: A | Discharge status: Skilled Nursing Facility | Payer: Self-pay | Source: Ambulatory Visit | Attending: Orthopedic Surgery

## 2014-05-19 ENCOUNTER — Inpatient Hospital Stay (HOSPITAL_COMMUNITY)
Admission: RE | Admit: 2014-05-19 | Discharge: 2014-05-22 | DRG: 470 | Disposition: A | Discharge status: Skilled Nursing Facility | Payer: Medicare Other | Source: Ambulatory Visit | Attending: Orthopedic Surgery | Admitting: Orthopedic Surgery

## 2014-05-19 ENCOUNTER — Encounter (HOSPITAL_COMMUNITY): Payer: Medicare Other | Admitting: Anesthesiology

## 2014-05-19 ENCOUNTER — Inpatient Hospital Stay (HOSPITAL_COMMUNITY): Payer: Medicare Other | Admitting: Anesthesiology

## 2014-05-19 DIAGNOSIS — J45909 Unspecified asthma, uncomplicated: Secondary | ICD-10-CM | POA: Diagnosis not present

## 2014-05-19 DIAGNOSIS — M179 Osteoarthritis of knee, unspecified: Secondary | ICD-10-CM | POA: Diagnosis present

## 2014-05-19 DIAGNOSIS — M1990 Unspecified osteoarthritis, unspecified site: Secondary | ICD-10-CM | POA: Diagnosis not present

## 2014-05-19 DIAGNOSIS — Z79899 Other long term (current) drug therapy: Secondary | ICD-10-CM | POA: Diagnosis not present

## 2014-05-19 DIAGNOSIS — IMO0002 Reserved for concepts with insufficient information to code with codable children: Secondary | ICD-10-CM | POA: Diagnosis not present

## 2014-05-19 DIAGNOSIS — Z96651 Presence of right artificial knee joint: Secondary | ICD-10-CM

## 2014-05-19 DIAGNOSIS — I10 Essential (primary) hypertension: Secondary | ICD-10-CM | POA: Diagnosis not present

## 2014-05-19 DIAGNOSIS — K219 Gastro-esophageal reflux disease without esophagitis: Secondary | ICD-10-CM | POA: Diagnosis not present

## 2014-05-19 DIAGNOSIS — Z96659 Presence of unspecified artificial knee joint: Secondary | ICD-10-CM | POA: Diagnosis not present

## 2014-05-19 DIAGNOSIS — J301 Allergic rhinitis due to pollen: Secondary | ICD-10-CM | POA: Diagnosis not present

## 2014-05-19 DIAGNOSIS — M1710 Unilateral primary osteoarthritis, unspecified knee: Secondary | ICD-10-CM | POA: Diagnosis not present

## 2014-05-19 DIAGNOSIS — M1711 Unilateral primary osteoarthritis, right knee: Secondary | ICD-10-CM

## 2014-05-19 DIAGNOSIS — Z01812 Encounter for preprocedural laboratory examination: Secondary | ICD-10-CM | POA: Diagnosis not present

## 2014-05-19 DIAGNOSIS — Z471 Aftercare following joint replacement surgery: Secondary | ICD-10-CM | POA: Diagnosis not present

## 2014-05-19 DIAGNOSIS — Z85828 Personal history of other malignant neoplasm of skin: Secondary | ICD-10-CM | POA: Diagnosis not present

## 2014-05-19 DIAGNOSIS — M25569 Pain in unspecified knee: Secondary | ICD-10-CM | POA: Diagnosis not present

## 2014-05-19 DIAGNOSIS — M25519 Pain in unspecified shoulder: Secondary | ICD-10-CM | POA: Diagnosis not present

## 2014-05-19 DIAGNOSIS — C44519 Basal cell carcinoma of skin of other part of trunk: Secondary | ICD-10-CM | POA: Diagnosis not present

## 2014-05-19 HISTORY — PX: TOTAL KNEE ARTHROPLASTY: SHX125

## 2014-05-19 LAB — TYPE AND SCREEN
ABO/RH(D): O POS
ANTIBODY SCREEN: NEGATIVE

## 2014-05-19 SURGERY — ARTHROPLASTY, KNEE, TOTAL
Anesthesia: Spinal | Site: Knee | Laterality: Right

## 2014-05-19 MED ORDER — ONDANSETRON HCL 4 MG/2ML IJ SOLN: 4.0000 mg | Freq: Four times a day (QID) | INTRAMUSCULAR | Status: DC | PRN

## 2014-05-19 MED ORDER — DEXAMETHASONE SODIUM PHOSPHATE 10 MG/ML IJ SOLN
10.0000 mg | Freq: Every day | INTRAMUSCULAR | Status: AC
  Filled 2014-05-19: qty 1

## 2014-05-19 MED ORDER — BUPIVACAINE HCL (PF) 0.25 % IJ SOLN
INTRAMUSCULAR | Status: AC
  Filled 2014-05-19: qty 30

## 2014-05-19 MED ORDER — LACTATED RINGERS IV SOLN: INTRAVENOUS | Status: DC

## 2014-05-19 MED ORDER — ALBUTEROL SULFATE (2.5 MG/3ML) 0.083% IN NEBU: 2.5000 mg | INHALATION_SOLUTION | Freq: Four times a day (QID) | RESPIRATORY_TRACT | Status: DC | PRN

## 2014-05-19 MED ORDER — VANCOMYCIN HCL IN DEXTROSE 1-5 GM/200ML-% IV SOLN
INTRAVENOUS | Status: AC
  Filled 2014-05-19: qty 200

## 2014-05-19 MED ORDER — PROPOFOL 10 MG/ML IV BOLUS
INTRAVENOUS | Status: AC
  Filled 2014-05-19: qty 20

## 2014-05-19 MED ORDER — SODIUM CHLORIDE 0.9 % IJ SOLN
INTRAMUSCULAR | Status: AC
  Filled 2014-05-19: qty 10

## 2014-05-19 MED ORDER — BUPIVACAINE IN DEXTROSE 0.75-8.25 % IT SOLN
INTRATHECAL | Status: DC | PRN
  Administered 2014-05-19: 2 mL via INTRATHECAL

## 2014-05-19 MED ORDER — VANCOMYCIN HCL IN DEXTROSE 1-5 GM/200ML-% IV SOLN
1000.0000 mg | Freq: Once | INTRAVENOUS | Status: AC
  Administered 2014-05-19: 1000 mg via INTRAVENOUS

## 2014-05-19 MED ORDER — FENTANYL CITRATE 0.05 MG/ML IJ SOLN: 25.0000 ug | INTRAMUSCULAR | Status: DC | PRN

## 2014-05-19 MED ORDER — TRAMADOL HCL 50 MG PO TABS: 50.0000 mg | ORAL_TABLET | Freq: Four times a day (QID) | ORAL | Status: DC | PRN

## 2014-05-19 MED ORDER — LORATADINE 10 MG PO TABS
10.0000 mg | ORAL_TABLET | Freq: Every day | ORAL | Status: DC
  Administered 2014-05-20 – 2014-05-22 (×3): 10 mg via ORAL
  Filled 2014-05-19 (×3): qty 1

## 2014-05-19 MED ORDER — DEXAMETHASONE 6 MG PO TABS
10.0000 mg | ORAL_TABLET | Freq: Every day | ORAL | Status: AC
  Administered 2014-05-20: 10 mg via ORAL
  Filled 2014-05-19: qty 1

## 2014-05-19 MED ORDER — MENTHOL 3 MG MT LOZG
1.0000 | LOZENGE | OROMUCOSAL | Status: DC | PRN
Start: 2014-05-19 — End: 2014-05-22
  Filled 2014-05-19: qty 9

## 2014-05-19 MED ORDER — ONDANSETRON HCL 4 MG/2ML IJ SOLN
INTRAMUSCULAR | Status: DC | PRN
  Administered 2014-05-19: 4 mg via INTRAVENOUS

## 2014-05-19 MED ORDER — METOCLOPRAMIDE HCL 5 MG/ML IJ SOLN: 5.0000 mg | Freq: Three times a day (TID) | INTRAMUSCULAR | Status: DC | PRN

## 2014-05-19 MED ORDER — BUPIVACAINE HCL 0.25 % IJ SOLN
INTRAMUSCULAR | Status: DC | PRN
  Administered 2014-05-19: 20 mL

## 2014-05-19 MED ORDER — ACETAMINOPHEN 650 MG RE SUPP: 650.0000 mg | Freq: Four times a day (QID) | RECTAL | Status: DC | PRN

## 2014-05-19 MED ORDER — CEFAZOLIN SODIUM 1-5 GM-% IV SOLN
1.0000 g | Freq: Four times a day (QID) | INTRAVENOUS | Status: AC
  Administered 2014-05-19 – 2014-05-20 (×2): 1 g via INTRAVENOUS
  Filled 2014-05-19 (×2): qty 50

## 2014-05-19 MED ORDER — DEXTROSE 5 % IV SOLN
20.0000 mg | INTRAVENOUS | Status: DC | PRN
  Administered 2014-05-19: 10 ug/min via INTRAVENOUS

## 2014-05-19 MED ORDER — PHENYLEPHRINE 40 MCG/ML (10ML) SYRINGE FOR IV PUSH (FOR BLOOD PRESSURE SUPPORT)
PREFILLED_SYRINGE | INTRAVENOUS | Status: AC
  Filled 2014-05-19: qty 10

## 2014-05-19 MED ORDER — 0.9 % SODIUM CHLORIDE (POUR BTL) OPTIME
TOPICAL | Status: DC | PRN
  Administered 2014-05-19: 1000 mL

## 2014-05-19 MED ORDER — KETOROLAC TROMETHAMINE 15 MG/ML IJ SOLN: 7.5000 mg | Freq: Four times a day (QID) | INTRAMUSCULAR | Status: AC | PRN

## 2014-05-19 MED ORDER — BUPIVACAINE LIPOSOME 1.3 % IJ SUSP
INTRAMUSCULAR | Status: DC | PRN
  Administered 2014-05-19: 20 mL

## 2014-05-19 MED ORDER — DIAZEPAM 5 MG PO TABS
5.0000 mg | ORAL_TABLET | Freq: Four times a day (QID) | ORAL | Status: DC | PRN
  Administered 2014-05-19: 5 mg via ORAL
  Filled 2014-05-19: qty 1

## 2014-05-19 MED ORDER — ACETAMINOPHEN 500 MG PO TABS
1000.0000 mg | ORAL_TABLET | Freq: Four times a day (QID) | ORAL | Status: AC
  Administered 2014-05-19 – 2014-05-20 (×4): 1000 mg via ORAL
  Filled 2014-05-19 (×5): qty 2

## 2014-05-19 MED ORDER — ONDANSETRON HCL 4 MG/2ML IJ SOLN
INTRAMUSCULAR | Status: AC
  Filled 2014-05-19: qty 2

## 2014-05-19 MED ORDER — ACETAMINOPHEN 325 MG PO TABS
650.0000 mg | ORAL_TABLET | Freq: Four times a day (QID) | ORAL | Status: DC | PRN
Start: 2014-05-20 — End: 2014-05-22

## 2014-05-19 MED ORDER — CEFAZOLIN SODIUM-DEXTROSE 2-3 GM-% IV SOLR
INTRAVENOUS | Status: AC
  Filled 2014-05-19: qty 50

## 2014-05-19 MED ORDER — DOCUSATE SODIUM 100 MG PO CAPS
100.0000 mg | ORAL_CAPSULE | Freq: Two times a day (BID) | ORAL | Status: DC
  Administered 2014-05-19 – 2014-05-22 (×6): 100 mg via ORAL

## 2014-05-19 MED ORDER — ONDANSETRON HCL 4 MG PO TABS: 4.0000 mg | ORAL_TABLET | Freq: Four times a day (QID) | ORAL | Status: DC | PRN

## 2014-05-19 MED ORDER — LOSARTAN POTASSIUM-HCTZ 50-12.5 MG PO TABS: 1.0000 | ORAL_TABLET | ORAL | Status: DC

## 2014-05-19 MED ORDER — PHENOL 1.4 % MT LIQD
1.0000 | OROMUCOSAL | Status: DC | PRN
  Filled 2014-05-19: qty 177

## 2014-05-19 MED ORDER — METOCLOPRAMIDE HCL 10 MG PO TABS: 5.0000 mg | ORAL_TABLET | Freq: Three times a day (TID) | ORAL | Status: DC | PRN

## 2014-05-19 MED ORDER — FLEET ENEMA 7-19 GM/118ML RE ENEM: 1.0000 | ENEMA | Freq: Once | RECTAL | Status: AC | PRN

## 2014-05-19 MED ORDER — LACTATED RINGERS IV SOLN
INTRAVENOUS | Status: DC | PRN
  Administered 2014-05-19: 14:00:00 via INTRAVENOUS

## 2014-05-19 MED ORDER — FENTANYL CITRATE 0.05 MG/ML IJ SOLN
INTRAMUSCULAR | Status: DC | PRN
Start: 2014-05-19 — End: 2014-05-19
  Administered 2014-05-19: 25 ug via INTRAVENOUS

## 2014-05-19 MED ORDER — METHOCARBAMOL 500 MG PO TABS
500.0000 mg | ORAL_TABLET | Freq: Four times a day (QID) | ORAL | Status: DC | PRN
  Administered 2014-05-19 – 2014-05-22 (×6): 500 mg via ORAL
  Filled 2014-05-19 (×6): qty 1

## 2014-05-19 MED ORDER — POLYETHYLENE GLYCOL 3350 17 G PO PACK
17.0000 g | PACK | Freq: Every day | ORAL | Status: DC | PRN
  Administered 2014-05-21: 17 g via ORAL

## 2014-05-19 MED ORDER — DEXAMETHASONE SODIUM PHOSPHATE 10 MG/ML IJ SOLN
10.0000 mg | Freq: Once | INTRAMUSCULAR | Status: AC
  Administered 2014-05-19: 10 mg via INTRAVENOUS

## 2014-05-19 MED ORDER — FLUTICASONE PROPIONATE 50 MCG/ACT NA SUSP
1.0000 | Freq: Every day | NASAL | Status: DC
  Administered 2014-05-20 – 2014-05-22 (×2): 1 via NASAL
  Filled 2014-05-19: qty 16

## 2014-05-19 MED ORDER — HYDROCHLOROTHIAZIDE 12.5 MG PO CAPS
12.5000 mg | ORAL_CAPSULE | Freq: Every day | ORAL | Status: DC
  Filled 2014-05-19: qty 1

## 2014-05-19 MED ORDER — ACETAMINOPHEN 10 MG/ML IV SOLN
1000.0000 mg | Freq: Once | INTRAVENOUS | Status: AC
  Administered 2014-05-19: 1000 mg via INTRAVENOUS
  Filled 2014-05-19: qty 100

## 2014-05-19 MED ORDER — SODIUM CHLORIDE 0.9 % IJ SOLN
INTRAMUSCULAR | Status: AC
  Filled 2014-05-19: qty 50

## 2014-05-19 MED ORDER — PHENYLEPHRINE HCL 10 MG/ML IJ SOLN
INTRAMUSCULAR | Status: AC
  Filled 2014-05-19: qty 2

## 2014-05-19 MED ORDER — RIVAROXABAN 10 MG PO TABS
10.0000 mg | ORAL_TABLET | Freq: Every day | ORAL | Status: DC
  Administered 2014-05-20 – 2014-05-22 (×3): 10 mg via ORAL
  Filled 2014-05-19 (×4): qty 1

## 2014-05-19 MED ORDER — CEFAZOLIN SODIUM-DEXTROSE 2-3 GM-% IV SOLR
2.0000 g | INTRAVENOUS | Status: AC
  Administered 2014-05-19: 2 g via INTRAVENOUS

## 2014-05-19 MED ORDER — CHLORHEXIDINE GLUCONATE 4 % EX LIQD: 60.0000 mL | Freq: Once | CUTANEOUS | Status: DC

## 2014-05-19 MED ORDER — DIPHENHYDRAMINE HCL 12.5 MG/5ML PO ELIX: 12.5000 mg | ORAL_SOLUTION | ORAL | Status: DC | PRN

## 2014-05-19 MED ORDER — SODIUM CHLORIDE 0.9 % IR SOLN
Status: DC | PRN
  Administered 2014-05-19: 1000 mL

## 2014-05-19 MED ORDER — SODIUM CHLORIDE 0.9 % IJ SOLN
INTRAMUSCULAR | Status: DC | PRN
  Administered 2014-05-19: 30 mL

## 2014-05-19 MED ORDER — EPHEDRINE SULFATE 50 MG/ML IJ SOLN
INTRAMUSCULAR | Status: AC
  Filled 2014-05-19: qty 1

## 2014-05-19 MED ORDER — SODIUM CHLORIDE 0.9 % IV SOLN: INTRAVENOUS | Status: DC

## 2014-05-19 MED ORDER — MIDAZOLAM HCL 2 MG/2ML IJ SOLN
INTRAMUSCULAR | Status: AC
  Filled 2014-05-19: qty 2

## 2014-05-19 MED ORDER — DEXAMETHASONE SODIUM PHOSPHATE 10 MG/ML IJ SOLN
INTRAMUSCULAR | Status: AC
  Filled 2014-05-19: qty 1

## 2014-05-19 MED ORDER — MIDAZOLAM HCL 5 MG/5ML IJ SOLN
INTRAMUSCULAR | Status: DC | PRN
  Administered 2014-05-19 (×2): 0.5 mg via INTRAVENOUS

## 2014-05-19 MED ORDER — BUPIVACAINE LIPOSOME 1.3 % IJ SUSP
20.0000 mL | Freq: Once | INTRAMUSCULAR | Status: DC
  Filled 2014-05-19: qty 20

## 2014-05-19 MED ORDER — METHOCARBAMOL 1000 MG/10ML IJ SOLN
500.0000 mg | Freq: Four times a day (QID) | INTRAMUSCULAR | Status: DC | PRN
  Filled 2014-05-19: qty 5

## 2014-05-19 MED ORDER — BISACODYL 10 MG RE SUPP
10.0000 mg | Freq: Every day | RECTAL | Status: DC | PRN
Start: 2014-05-19 — End: 2014-05-22

## 2014-05-19 MED ORDER — MORPHINE SULFATE 2 MG/ML IJ SOLN: 1.0000 mg | INTRAMUSCULAR | Status: DC | PRN

## 2014-05-19 MED ORDER — OXYCODONE HCL 5 MG PO TABS
5.0000 mg | ORAL_TABLET | ORAL | Status: DC | PRN
  Administered 2014-05-19: 5 mg via ORAL
  Administered 2014-05-20 – 2014-05-21 (×8): 10 mg via ORAL
  Administered 2014-05-21: 5 mg via ORAL
  Administered 2014-05-21 – 2014-05-22 (×4): 10 mg via ORAL
  Filled 2014-05-19: qty 1
  Filled 2014-05-19: qty 2
  Filled 2014-05-19: qty 1
  Filled 2014-05-19 (×11): qty 2

## 2014-05-19 MED ORDER — PHENYLEPHRINE HCL 10 MG/ML IJ SOLN
INTRAMUSCULAR | Status: DC | PRN
  Administered 2014-05-19: 40 ug via INTRAVENOUS
  Administered 2014-05-19: 80 ug via INTRAVENOUS

## 2014-05-19 MED ORDER — LOSARTAN POTASSIUM 50 MG PO TABS
50.0000 mg | ORAL_TABLET | Freq: Every day | ORAL | Status: DC
  Filled 2014-05-19: qty 1

## 2014-05-19 MED ORDER — PROPOFOL INFUSION 10 MG/ML OPTIME
INTRAVENOUS | Status: DC | PRN
  Administered 2014-05-19: 75 ug/kg/min via INTRAVENOUS

## 2014-05-19 MED ORDER — PANTOPRAZOLE SODIUM 40 MG PO TBEC
80.0000 mg | DELAYED_RELEASE_TABLET | Freq: Every day | ORAL | Status: DC
  Filled 2014-05-19: qty 2

## 2014-05-19 MED ORDER — DEXTROSE-NACL 5-0.9 % IV SOLN
INTRAVENOUS | Status: DC
  Administered 2014-05-19: 1000 mL via INTRAVENOUS
  Administered 2014-05-20 – 2014-05-21 (×3): via INTRAVENOUS

## 2014-05-19 MED ORDER — FENTANYL CITRATE 0.05 MG/ML IJ SOLN
INTRAMUSCULAR | Status: AC
  Filled 2014-05-19: qty 2

## 2014-05-19 SURGICAL SUPPLY — 60 items
BAG SPEC THK2 15X12 ZIP CLS (MISCELLANEOUS) ×1
BAG ZIPLOCK 12X15 (MISCELLANEOUS) ×2 IMPLANT
BANDAGE ELASTIC 6 VELCRO ST LF (GAUZE/BANDAGES/DRESSINGS) ×2 IMPLANT
BANDAGE ESMARK 6X9 LF (GAUZE/BANDAGES/DRESSINGS) ×1 IMPLANT
BLADE SAG 18X100X1.27 (BLADE) ×2 IMPLANT
BLADE SAW SGTL 11.0X1.19X90.0M (BLADE) ×2 IMPLANT
BNDG CMPR 9X6 STRL LF SNTH (GAUZE/BANDAGES/DRESSINGS) ×1
BNDG ESMARK 6X9 LF (GAUZE/BANDAGES/DRESSINGS) ×2
BOWL SMART MIX CTS (DISPOSABLE) ×2 IMPLANT
CAPT RP KNEE ×1 IMPLANT
CEMENT HV SMART SET (Cement) ×4 IMPLANT
CUFF TOURN SGL QUICK 34 (TOURNIQUET CUFF) ×2
CUFF TRNQT CYL 34X4X40X1 (TOURNIQUET CUFF) ×1 IMPLANT
DECANTER SPIKE VIAL GLASS SM (MISCELLANEOUS) ×2 IMPLANT
DRAPE EXTREMITY T 121X128X90 (DRAPE) ×2 IMPLANT
DRAPE POUCH INSTRU U-SHP 10X18 (DRAPES) ×2 IMPLANT
DRAPE U-SHAPE 47X51 STRL (DRAPES) ×2 IMPLANT
DRSG ADAPTIC 3X8 NADH LF (GAUZE/BANDAGES/DRESSINGS) ×2 IMPLANT
DRSG PAD ABDOMINAL 8X10 ST (GAUZE/BANDAGES/DRESSINGS) ×2 IMPLANT
DURAPREP 26ML APPLICATOR (WOUND CARE) ×2 IMPLANT
ELECT REM PT RETURN 9FT ADLT (ELECTROSURGICAL) ×2
ELECTRODE REM PT RTRN 9FT ADLT (ELECTROSURGICAL) ×1 IMPLANT
EVACUATOR 1/8 PVC DRAIN (DRAIN) ×2 IMPLANT
FACESHIELD WRAPAROUND (MASK) ×10 IMPLANT
FACESHIELD WRAPAROUND OR TEAM (MASK) ×5 IMPLANT
GAUZE SPONGE 4X4 12PLY STRL (GAUZE/BANDAGES/DRESSINGS) ×2 IMPLANT
GLOVE BIO SURGEON STRL SZ7.5 (GLOVE) IMPLANT
GLOVE BIO SURGEON STRL SZ8 (GLOVE) ×2 IMPLANT
GLOVE BIOGEL PI IND STRL 6.5 (GLOVE) IMPLANT
GLOVE BIOGEL PI IND STRL 8 (GLOVE) ×1 IMPLANT
GLOVE BIOGEL PI INDICATOR 6.5 (GLOVE)
GLOVE BIOGEL PI INDICATOR 8 (GLOVE) ×1
GLOVE SURG SS PI 6.5 STRL IVOR (GLOVE) IMPLANT
GOWN STRL REUS W/TWL LRG LVL3 (GOWN DISPOSABLE) ×2 IMPLANT
GOWN STRL REUS W/TWL XL LVL3 (GOWN DISPOSABLE) IMPLANT
HANDPIECE INTERPULSE COAX TIP (DISPOSABLE) ×2
IMMOBILIZER KNEE 20 (SOFTGOODS) ×2
IMMOBILIZER KNEE 20 THIGH 36 (SOFTGOODS) ×1 IMPLANT
KIT BASIN OR (CUSTOM PROCEDURE TRAY) ×2 IMPLANT
MANIFOLD NEPTUNE II (INSTRUMENTS) ×2 IMPLANT
NDL SAFETY ECLIPSE 18X1.5 (NEEDLE) ×2 IMPLANT
NEEDLE HYPO 18GX1.5 SHARP (NEEDLE) ×4
NS IRRIG 1000ML POUR BTL (IV SOLUTION) ×2 IMPLANT
PACK TOTAL JOINT (CUSTOM PROCEDURE TRAY) ×2 IMPLANT
PADDING CAST COTTON 6X4 STRL (CAST SUPPLIES) ×4 IMPLANT
POSITIONER SURGICAL ARM (MISCELLANEOUS) ×2 IMPLANT
SET HNDPC FAN SPRY TIP SCT (DISPOSABLE) ×1 IMPLANT
STRIP CLOSURE SKIN 1/2X4 (GAUZE/BANDAGES/DRESSINGS) ×4 IMPLANT
SUCTION FRAZIER 12FR DISP (SUCTIONS) ×2 IMPLANT
SUT MNCRL AB 4-0 PS2 18 (SUTURE) ×2 IMPLANT
SUT VIC AB 2-0 CT1 27 (SUTURE) ×6
SUT VIC AB 2-0 CT1 TAPERPNT 27 (SUTURE) ×3 IMPLANT
SUT VLOC 180 0 24IN GS25 (SUTURE) ×2 IMPLANT
SYRINGE 20CC LL (MISCELLANEOUS) ×2 IMPLANT
SYRINGE 60CC LL (MISCELLANEOUS) ×2 IMPLANT
TOWEL OR 17X26 10 PK STRL BLUE (TOWEL DISPOSABLE) ×2 IMPLANT
TOWEL OR NON WOVEN STRL DISP B (DISPOSABLE) IMPLANT
TRAY FOLEY CATH 14FRSI W/METER (CATHETERS) ×2 IMPLANT
WATER STERILE IRR 1500ML POUR (IV SOLUTION) ×2 IMPLANT
WRAP KNEE MAXI GEL POST OP (GAUZE/BANDAGES/DRESSINGS) ×2 IMPLANT

## 2014-05-19 NOTE — Anesthesia Preprocedure Evaluation (Addendum)
Anesthesia Evaluation  Patient identified by MRN, date of birth, ID band Patient awake    Reviewed: Allergy & Precautions, H&P , NPO status , Patient's Chart, lab work & pertinent test results  Airway Mallampati: II  TM Distance: >3 FB Neck ROM: Full    Dental no notable dental hx.    Pulmonary asthma ,  breath sounds clear to auscultation  Pulmonary exam normal       Cardiovascular hypertension, Pt. on medications Rhythm:Regular Rate:Normal     Neuro/Psych negative neurological ROS  negative psych ROS   GI/Hepatic Neg liver ROS, GERD-  Medicated,  Endo/Other  negative endocrine ROS  Renal/GU negative Renal ROS  negative genitourinary   Musculoskeletal negative musculoskeletal ROS (+)   Abdominal   Peds negative pediatric ROS (+)  Hematology negative hematology ROS (+)   Anesthesia Other Findings   Reproductive/Obstetrics negative OB ROS                             Anesthesia Physical  Anesthesia Plan  ASA: II  Anesthesia Plan: Spinal   Post-op Pain Management:    Induction: Intravenous  Airway Management Planned:   Additional Equipment:   Intra-op Plan:   Post-operative Plan:   Informed Consent: I have reviewed the patients History and Physical, chart, labs and discussed the procedure including the risks, benefits and alternatives for the proposed anesthesia with the patient or authorized representative who has indicated his/her understanding and acceptance.   Dental advisory given  Plan Discussed with: CRNA  Anesthesia Plan Comments: (Discussed risks/benefits of spinal including headache, backache, failure, bleeding, infection, and nerve damage. Patient consents to spinal. Questions answered. Coagulation studies and platelet count acceptable.  She has a history of spinal stenosis and has had some spinal injections by Dr. Ramos. Spinal stenosis may make placement of a  spinal anesthetic more difficult.)        Anesthesia Quick Evaluation  

## 2014-05-19 NOTE — Anesthesia Procedure Notes (Signed)
Spinal  Patient location during procedure: OR Staffing Anesthesiologist: Salley Scarlet Performed by: anesthesiologist  Preanesthetic Checklist Completed: patient identified, site marked, surgical consent, pre-op evaluation, timeout performed, IV checked, risks and benefits discussed and monitors and equipment checked Spinal Block Patient position: sitting Prep: Betadine Patient monitoring: heart rate, continuous pulse ox and blood pressure Approach: midline Location: L3-4 Injection technique: single-shot Needle Needle type: Spinocan  Needle gauge: 22 G Needle length: 9 cm Additional Notes Expiration date of kit checked and confirmed. Patient tolerated procedure well, without complications. No paresthesia. CSF clear.

## 2014-05-19 NOTE — Transfer of Care (Signed)
Immediate Anesthesia Transfer of Care Note  Patient: Carla [name]  Procedure(s) Performed: Procedure(s): RIGHT TOTAL KNEE ARTHROPLASTY (Right)  Patient Location: PACU  Anesthesia Type:MAC and Spinal  Level of Consciousness: awake, alert , oriented and patient cooperative  Airway & Oxygen Therapy: Patient Spontanous Breathing and Patient connected to face mask oxygen  Post-op Assessment: Report given to PACU RN and Post -op Vital signs reviewed and stable  Post vital signs: Reviewed and stable  Complications: No apparent anesthesia complications

## 2014-05-19 NOTE — Op Note (Signed)
Pre-operative diagnosis- Osteoarthritis  Right knee(s)  Post-operative diagnosis- Osteoarthritis Right knee(s)  Procedure-  Right  Total Knee Arthroplasty  Surgeon- Carla Plover. Britain Anagnos, MD  Assistant- [name], PA-C   Anesthesia-  Spinal  EBL-* No blood loss amount entered *   Drains Hemovac  Tourniquet time-  Total Tourniquet Time Documented: Thigh (Right) - 30 minutes Total: Thigh (Right) - 30 minutes     Complications- None  Condition-PACU - hemodynamically stable.   Brief Clinical Note  Carla [name] is a 80 y.o. year old female with end stage OA of her right knee with progressively worsening pain and dysfunction. She has constant pain, with activity and at rest and significant functional deficits with difficulties even with ADLs. She has had extensive non-op management including analgesics, injections of cortisone and viscosupplements, and home exercise program, but remains in significant pain with significant dysfunction.Radiographs show bone on bone arthritis lateral. She presents now for right Total Knee Arthroplasty.    Procedure in detail---   The patient is brought into the operating room and positioned supine on the operating table. After successful administration of  Spinal,   a tourniquet is placed high on the  Right thigh(s) and the lower extremity is prepped and draped in the usual sterile fashion. Time out is performed by the operating team and then the  Right lower extremity is wrapped in Esmarch, knee flexed and the tourniquet inflated to 300 mmHg.       A midline incision is made with a ten blade through the subcutaneous tissue to the level of the extensor mechanism. A fresh blade is used to make a medial parapatellar arthrotomy. Soft tissue over the proximal medial tibia is subperiosteally elevated to the joint line with a knife and into the semimembranosus bursa with a Cobb elevator. Soft tissue over the proximal lateral tibia is elevated with attention being  paid to avoiding the patellar tendon on the tibial tubercle. The patella is everted, knee flexed 90 degrees and the ACL and PCL are removed. Findings are bone on bone lateral compartment with large osteophyte formation.        The drill is used to create a starting hole in the distal femur and the canal is thoroughly irrigated with sterile saline to remove the fatty contents. The 5 degree Right  valgus alignment guide is placed into the femoral canal and the distal femoral cutting block is pinned to remove 10 mm off the distal femur. Resection is made with an oscillating saw.      The tibia is subluxed forward and the menisci are removed. The extramedullary alignment guide is placed referencing proximally at the medial aspect of the tibial tubercle and distally along the second metatarsal axis and tibial crest. The block is pinned to remove 32mm off the more deficient lateral  side. Resection is made with an oscillating saw. Size 2is the most appropriate size for the tibia and the proximal tibia is prepared with the modular drill and keel punch for that size.      The femoral sizing guide is placed and size 2.5 is most appropriate. Rotation is marked off the epicondylar axis and confirmed by creating a rectangular flexion gap at 90 degrees. The size 2.5 cutting block is pinned in this rotation and the anterior, posterior and chamfer cuts are made with the oscillating saw. The intercondylar block is then placed and that cut is made.      Trial size 2 tibial component, trial size 2.5 posterior  stabilized femur and a 10  mm posterior stabilized rotating platform insert trial is placed. Full extension is achieved with excellent varus/valgus and anterior/posterior balance throughout full range of motion. The patella is everted and thickness measured to be 22  mm. Free hand resection is taken to 12 mm, a 35 template is placed, lug holes are drilled, trial patella is placed, and it tracks normally. Osteophytes are  removed off the posterior femur with the trial in place. All trials are removed and the cut bone surfaces prepared with pulsatile lavage. Cement is mixed and once ready for implantation, the size 2 tibial implant, size  2.5 posterior stabilized femoral component, and the size 35 patella are cemented in place and the patella is held with the clamp. The trial insert is placed and the knee held in full extension. The Exparel (20 ml mixed with 30 ml saline) and .25% Bupivicaine, are injected into the extensor mechanism, posterior capsule, medial and lateral gutters and subcutaneous tissues.  All extruded cement is removed and once the cement is hard the permanent 10 mm posterior stabilized rotating platform insert is placed into the tibial tray.      The wound is copiously irrigated with saline solution and the extensor mechanism closed over a hemovac drain with #1 V-loc suture. The tourniquet is released for a total tourniquet time of 30  minutes. Flexion against gravity is 140 degrees and the patella tracks normally. Subcutaneous tissue is closed with 2.0 vicryl and subcuticular with running 4.0 Monocryl. The incision is cleaned and dried and steri-strips and a bulky sterile dressing are applied. The limb is placed into a knee immobilizer and the patient is awakened and transported to recovery in stable condition.      Please note that a surgical assistant was a medical necessity for this procedure in order to perform it in a safe and expeditious manner. Surgical assistant was necessary to retract the ligaments and vital neurovascular structures to prevent injury to them and also necessary for proper positioning of the limb to allow for anatomic placement of the prosthesis.   Carla Plover Jassiel Flye, MD    05/19/2014, 3:28 PM

## 2014-05-19 NOTE — Interval H&P Note (Signed)
History and Physical Interval Note:  05/19/2014 2:12 PM  Carla [name]  has presented today for surgery, with the diagnosis of OA OF RIGHT KNEE  The various methods of treatment have been discussed with the patient and family. After consideration of risks, benefits and other options for treatment, the patient has consented to  Procedure(s): RIGHT TOTAL KNEE ARTHROPLASTY (Right) as a surgical intervention .  The patient's history has been reviewed, patient examined, no change in status, stable for surgery.  I have reviewed the patient's chart and labs.  Questions were answered to the patient's satisfaction.     Gearlean Alf

## 2014-05-19 NOTE — Anesthesia Postprocedure Evaluation (Signed)
  Anesthesia Post-op Note  Patient: Carla [name]  Procedure(s) Performed: Procedure(s) (LRB): RIGHT TOTAL KNEE ARTHROPLASTY (Right)  Patient Location: PACU  Anesthesia Type: Spinal  Level of Consciousness: awake and alert   Airway and Oxygen Therapy: Patient Spontanous Breathing  Post-op Pain: mild  Post-op Assessment: Post-op Vital signs reviewed, Patient's Cardiovascular Status Stable, Respiratory Function Stable, Patent Airway and No signs of Nausea or vomiting  Last Vitals:  Filed Vitals:   05/19/14 1900  BP: 115/44  Pulse: 68  Temp: 36.7 C  Resp: 15    Post-op Vital Signs: stable   Complications: No apparent anesthesia complications

## 2014-05-20 ENCOUNTER — Encounter (HOSPITAL_COMMUNITY): Payer: Self-pay | Admitting: Orthopedic Surgery

## 2014-05-20 ENCOUNTER — Ambulatory Visit: Payer: Medicare Other | Admitting: Internal Medicine

## 2014-05-20 DIAGNOSIS — I10 Essential (primary) hypertension: Secondary | ICD-10-CM | POA: Diagnosis present

## 2014-05-20 DIAGNOSIS — K219 Gastro-esophageal reflux disease without esophagitis: Secondary | ICD-10-CM | POA: Diagnosis present

## 2014-05-20 LAB — BASIC METABOLIC PANEL
Anion gap: 10 (ref 5–15)
BUN: 10 mg/dL (ref 6–23)
CHLORIDE: 100 meq/L (ref 96–112)
CO2: 26 meq/L (ref 19–32)
Calcium: 8.3 mg/dL — ABNORMAL LOW (ref 8.4–10.5)
Creatinine, Ser: 0.51 mg/dL (ref 0.50–1.10)
GFR calc Af Amer: >90 mL/min (ref >90)
GFR, EST NON AFRICAN AMERICAN: 85 mL/min — AB (ref >90)
GLUCOSE: 171 mg/dL — AB (ref 70–99)
POTASSIUM: 3.7 meq/L (ref 3.7–5.3)
Sodium: 136 mEq/L — ABNORMAL LOW (ref 137–147)

## 2014-05-20 LAB — CBC
HEMATOCRIT: 31.8 % — AB (ref 36.0–46.0)
HEMOGLOBIN: 10.3 g/dL — AB (ref 12.0–15.0)
MCH: 32.6 pg (ref 26.0–34.0)
MCHC: 32.4 g/dL (ref 30.0–36.0)
MCV: 100.6 fL — ABNORMAL HIGH (ref 78.0–100.0)
Platelets: 321 10*3/uL (ref 150–400)
RBC: 3.16 MIL/uL — AB (ref 3.87–5.11)
RDW: 13.3 % (ref 11.5–15.5)
WBC: 16.1 10*3/uL — ABNORMAL HIGH (ref 4.0–10.5)

## 2014-05-20 MED ORDER — OMEPRAZOLE 20 MG PO CPDR
40.0000 mg | DELAYED_RELEASE_CAPSULE | Freq: Every day | ORAL | Status: DC
  Administered 2014-05-20 – 2014-05-22 (×3): 40 mg via ORAL
  Filled 2014-05-20 (×3): qty 2

## 2014-05-20 MED ORDER — NON FORMULARY: 40.0000 mg | Freq: Every day | Status: DC

## 2014-05-20 MED ORDER — LOSARTAN POTASSIUM 50 MG PO TABS
50.0000 mg | ORAL_TABLET | Freq: Every day | ORAL | Status: DC
  Administered 2014-05-20 – 2014-05-22 (×3): 50 mg via ORAL
  Filled 2014-05-20 (×3): qty 1

## 2014-05-20 MED ORDER — HYDROCHLOROTHIAZIDE 12.5 MG PO CAPS
12.5000 mg | ORAL_CAPSULE | Freq: Every day | ORAL | Status: DC
  Administered 2014-05-20 – 2014-05-22 (×3): 12.5 mg via ORAL
  Filled 2014-05-20 (×3): qty 1

## 2014-05-20 NOTE — Progress Notes (Signed)
Clinical Social Work Department BRIEF PSYCHOSOCIAL ASSESSMENT 05/20/2014  Patient:  Carla [name], Carla [name]     Account Number:  [id number]     Admit date:  05/19/2014  Clinical Social Worker:  Lacie Scotts  Date/Time:  05/20/2014 02:07 PM  Referred by:  Physician  Date Referred:  05/20/2014 Referred for  SNF Placement   Other Referral:   Interview type:  Patient Other interview type:    PSYCHOSOCIAL DATA Living Status:  ALONE Admitted from facility:   Level of care:   Primary support name:  Timmothy Euler Primary support relationship to patient:  CHILD, ADULT Degree of support available:   supportive    CURRENT CONCERNS Current Concerns  Post-Acute Placement   Other Concerns:    SOCIAL WORK ASSESSMENT / PLAN Pt is an 80 yr old female living at home prior to hospitalization. CSW met with pt / granddaughter to assist with d/c planning. Pt has made prior arrangements to have [facility] Rehab at [facility] following hospital d/c. CSW has contacted SNF and d/c plan has ben confirmed. CSW will continue to follow to assist with d/c planning to SNF.   Assessment/plan status:  Psychosocial Support/Ongoing Assessment of Needs Other assessment/ plan:   Information/referral to community resources:   Insurance coverage for SNF and ambulance transport to be reviewed.    PATIENT'S/FAMILY'S RESPONSE TO PLAN OF CARE: Pt's pain is being managed. She is progressing with therapy and is looking forward to having rehab at [facility].    Werner Lean LCSW [phone]
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
## 2014-05-20 NOTE — Progress Notes (Signed)
CARE MANAGEMENT NOTE 05/20/2014  Patient:  Carla [name], Carla [name]   Account Number:  [id number]  Date Initiated:  05/20/2014  Documentation initiated by:  Lenda Baratta  Subjective/Objective Assessment:   total knee replacement of right knee     Action/Plan:   plans at time of review to go to [facility] in [place]/ hhc will be [place]   Anticipated DC Date:  05/23/2014   Anticipated DC Plan:  SKILLED NURSING FACILITY  In-house referral  Clinical Social Worker      DC Planning Services  CM consult      [facility] Choice  NA   Choice offered to / List presented to:  NA   DME arranged  NA      DME agency  NA     [place] arranged  NA      [place]   Status of service:  In process, will continue to follow Medicare Important Message given?  NA - LOS <3 / Initial given by admissions (If response is "NO", the following Medicare IM given date fields will be blank) Date Medicare IM given:   Medicare IM given by:   Date Additional Medicare IM given:   Additional Medicare IM given by:    Discharge Disposition:    Per UR Regulation:  Reviewed for med. necessity/level of care/duration of stay  If discussed at [place] of Stay Meetings, dates discussed:    Comments:  Lawsen Arnott,RN,BSn,CCM:
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
## 2014-05-20 NOTE — Progress Notes (Signed)
OT Cancellation Note  Patient Details Name: Carla [name] MRN: [id number] DOB: 01-26-1926   Cancelled Treatment:    Reason Eval/Treat Not Completed: Other (comment) Pt is Medicare/Medicaid and current D/C plan is SNF. No apparent immediate acute care OT needs, therefore will defer OT to SNF. If OT eval is needed please call Acute Rehab Dept. at [place] 05/20/2014, 3:38 PM Lesle Chris, OTR/L [phone] 05/20/2014

## 2014-05-20 NOTE — Progress Notes (Addendum)
Physical Therapy Treatment Patient Details Name: Carla [name] MRN: [id number] DOB: Nov 20, 1925 Today's Date: 05/20/2014    History of Present Illness R tka.    PT Comments    Pt tolerated very well.  Follow Up Recommendations  SNF     Equipment Recommendations  None recommended by PT    Recommendations for Other Services       Precautions / Restrictions Precautions Precautions: Fall;Knee Required Braces or Orthoses: Knee Immobilizer - Right Restrictions Weight Bearing Restrictions: No    Mobility  Bed Mobility Overal bed mobility: Needs Assistance Bed Mobility: Sit to Supine     Supine to sit: Min assist     General bed mobility comments: support R leg onto bed  Transfers Overall transfer level: Needs assistance Equipment used: Rolling walker (2 wheeled) Transfers: Sit to/from [facility] Sit to Stand: Min assist Stand pivot transfers: Min assist       General transfer comment: cues for safety, hand and R leg position, from bed and BSC  Ambulation/Gait Ambulation/Gait assistance: Min assist Ambulation Distance (Feet): 60 Feet (then 20') Assistive device: Rolling walker (2 wheeled) Gait Pattern/deviations: Step-to pattern;Step-through pattern     General Gait Details: cues for safety, sequence   Stairs            Wheelchair Mobility    Modified Rankin (Stroke Patients Only)       Balance                                    Cognition Arousal/Alertness: Awake/alert Behavior During Therapy: WFL for tasks assessed/performed Overall Cognitive Status: Within Functional Limits for tasks assessed                      Exercises Total Joint Exercises Ankle Circles/Pumps: AROM;Both;10 reps;Supine Quad Sets: AROM;Both;10 reps;Supine Heel Slides: AAROM;Right;10 reps;Supine Hip ABduction/ADduction: AROM;Right;10 reps;Supine Straight Leg Raises: AAROM;Right;10 reps;Supine Goniometric ROM: 10-60     General Comments        Pertinent Vitals/Pain Pain Assessment: 0-10 Pain Score: 3  Pain Descriptors / Indicators: Aching Pain Intervention(s): Monitored during session;Ice applied;Premedicated before session    Home Living Family/patient expects to be discharged to:: Skilled nursing facility Living Arrangements: Alone                  Prior Function Level of Independence: Independent          PT Goals (current goals can now be found in the care plan section) Acute Rehab PT Goals Patient Stated Goal: to go to rehab, get the other one done PT Goal Formulation: With patient Time For Goal Achievement: 05/27/14 Potential to Achieve Goals: Good    Frequency       PT Plan      Co-evaluation             End of Session Equipment Utilized During Treatment: Right knee immobilizer Activity Tolerance: Patient tolerated treatment well Patient left: in chair;with call bell/phone within reach     Time: 1305-1348PT Time Calculation (min): 50 min  Charges:  $Gait Training: 8-22 mins $Therapeutic Activity: 8-22 mins $Self Care/Home Management: 8-22                    G Codes:      [name] 05/20/2014, 2:13 PM Tresa Endo PT [phone]

## 2014-05-20 NOTE — Progress Notes (Signed)
Clinical Social Work Department CLINICAL SOCIAL WORK PLACEMENT NOTE 05/20/2014  Patient:  Carla [name], Carla [name]  Account Number:  [id number] Admit date:  05/19/2014  Clinical Social Worker:  Werner Lean, LCSW  Date/time:  05/20/2014 02:23 PM  Clinical Social Work is seeking post-discharge placement for this patient at the following level of care:   SKILLED NURSING   (*CSW will update this form in Epic as items are completed)     Patient/family provided with [facility] Department of Clinical Social Work's list of facilities offering this level of care within the geographic area requested by the patient (or if unable, by the patient's family).  05/20/2014  Patient/family informed of their freedom to choose among providers that offer the needed level of care, that participate in Medicare, Medicaid or managed care program needed by the patient, have an available bed and are willing to accept the patient.    Patient/family informed of [facility]' ownership interest in [facility], as well as of the fact that they are under no obligation to receive care at this facility.  PASARR submitted to EDS on 05/20/2014 PASARR number received on 05/20/2014  FL2 transmitted to all facilities in geographic area requested by pt/family on  05/20/2014 FL2 transmitted to all facilities within larger geographic area on   Patient informed that his/her managed care company has contracts with or will negotiate with  certain facilities, including the following:     Patient/family informed of bed offers received:  05/20/2014 Patient chooses bed at [facility] Physician recommends and patient chooses bed at    Patient to be transferred to  on   Patient to be transferred to facility by  Patient and family notified of transfer on  Name of family member notified:    The following physician request were entered in Epic:   Additional Comments:  Werner Lean LCSW [phone]

## 2014-05-20 NOTE — Discharge Instructions (Addendum)
° °Dr. Frank [name] °Total Joint Specialist °[facility] °[address] °[place], [place] [address] °[phone] ° °TOTAL KNEE REPLACEMENT POSTOPERATIVE DIRECTIONS ° ° ° °Knee Rehabilitation, Guidelines Following Surgery  °Results after knee surgery are often greatly improved when you follow the exercise, range of motion and muscle strengthening exercises prescribed by your doctor. Safety measures are also important to protect the knee from further injury. Any time any of these exercises cause you to have increased pain or swelling in your knee joint, decrease the amount until you are comfortable again and slowly increase them. If you have problems or questions, call your caregiver or physical therapist for advice.  ° °HOME CARE INSTRUCTIONS  °Remove items at home which could result in a fall. This includes throw rugs or furniture in walking pathways.  °Continue medications as instructed at time of discharge. °You may have some home medications which will be placed on hold until you complete the course of blood thinner medication.  °You may start showering once you are discharged home but do not submerge the incision under water. Just pat the incision dry and apply a dry gauze dressing on daily. °Walk with walker as instructed.  °You may resume a sexual relationship in one month or when given the OK by  your doctor.  °· Use walker as long as suggested by your caregivers. °· Avoid periods of inactivity such as sitting longer than an hour when not asleep. This helps prevent blood clots.  °You may put full weight on your legs and walk as much as is comfortable.  °You may return to work once you are cleared by your doctor.  °Do not drive a car for 6 weeks or until released by you surgeon.  °· Do not drive while taking narcotics.  °Wear the elastic stockings for three weeks following surgery during the day but you may remove then at night. °Make sure you keep all of your appointments after your  operation with all of your doctors and caregivers. You should call the office at the above phone number and make an appointment for approximately two weeks after the date of your surgery. °Change the dressing daily and reapply a dry dressing each time. °Please pick up a stool softener and laxative for home use as long as you are requiring pain medications. °· Continue to use ice on the knee for pain and swelling from surgery. You may notice swelling that will progress down to the foot and ankle.  This is normal after surgery.  Elevate the leg when you are not up walking on it.   °It is important for you to complete the blood thinner medication as prescribed by your doctor. °· Continue to use the breathing machine which will help keep your temperature down.  It is common for your temperature to cycle up and down following surgery, especially at night when you are not up moving around and exerting yourself.  The breathing machine keeps your lungs expanded and your temperature down. ° °RANGE OF MOTION AND STRENGTHENING EXERCISES  °Rehabilitation of the knee is important following a knee injury or an operation. After just a few days of immobilization, the muscles of the thigh which control the knee become weakened and shrink (atrophy). Knee exercises are designed to build up the tone and strength of the thigh muscles and to improve knee motion. Often times heat used for twenty to thirty minutes before working out will loosen up your tissues and help with improving the   range of motion but do not use heat for the first two weeks following surgery. These exercises can be done on a training (exercise) mat, on the floor, on a table or on a bed. Use what ever works the best and is most comfortable for you Knee exercises include:  Leg Lifts - While your knee is still immobilized in a splint or cast, you can do straight leg raises. Lift the leg to 60 degrees, hold for 3 sec, and slowly lower the leg. Repeat 10-20 times 2-3  times daily. Perform this exercise against resistance later as your knee gets better.  Quad and Hamstring Sets - Tighten up the muscle on the front of the thigh (Quad) and hold for 5-10 sec. Repeat this 10-20 times hourly. Hamstring sets are done by pushing the foot backward against an object and holding for 5-10 sec. Repeat as with quad sets.  A rehabilitation program following serious knee injuries can speed recovery and prevent re-injury in the future due to weakened muscles. Contact your doctor or a physical therapist for more information on knee rehabilitation.   SKILLED REHAB INSTRUCTIONS: If the patient is transferred to a skilled rehab facility following release from the hospital, a list of the current medications will be sent to the facility for the patient to continue.  When discharged from the skilled rehab facility, please have the facility set up the patient's [place] prior to being released. Also, the skilled facility will be responsible for providing the patient with their medications at time of release from the facility to include their pain medication, the muscle relaxants, and their blood thinner medication. If the patient is still at the rehab facility at time of the two week follow up appointment, the skilled rehab facility will also need to assist the patient in arranging follow up appointment in our office and any transportation needs.  MAKE SURE YOU:  Understand these instructions.  Will watch your condition.  Will get help right away if you are not doing well or get worse.    Pick up stool softner and laxative for home. Do not submerge incision under water. May shower. Continue to use ice for pain and swelling from surgery.  Take Xarelto for two and a half more weeks, then discontinue Xarelto. Once the patient has completed the blood thinner regimen, then take a Baby 81 mg Aspirin daily for three more weeks.  When discharged from the skilled rehab  facility, please have the facility set up the patient's [place] prior to being released.  Also provide the patient with their medications at time of release from the facility to include their pain medication, the muscle relaxants, and their blood thinner medication.  If the patient is still at the rehab facility at time of follow up appointment, please also assist the patient in arranging follow up appointment in our office and any transportation needs.   Information on my medicine - XARELTO (Rivaroxaban)  This medication education was reviewed with me or my healthcare representative as part of my discharge preparation.  The pharmacist that spoke with me during my hospital stay was:  Absher, Julieta Bellini, RPH  Why was Xarelto prescribed for you? Xarelto was prescribed for you to reduce the risk of blood clots forming after orthopedic surgery. The medical term for these abnormal blood clots is venous thromboembolism (VTE).  What do you need to know about xarelto ? Take your Xarelto ONCE DAILY at the same time every day.  You may take it either with or without food.  If you have difficulty swallowing the tablet whole, you may crush it and mix in applesauce just prior to taking your dose.  Take Xarelto exactly as prescribed by your doctor and DO NOT stop taking Xarelto without talking to the doctor who prescribed the medication.  Stopping without other VTE prevention medication to take the place of Xarelto may increase your risk of developing a clot.  After discharge, you should have regular check-up appointments with your healthcare provider that is prescribing your Xarelto.    What do you do if you miss a dose? If you miss a dose, take it as soon as you remember on the same day then continue your regularly scheduled once daily regimen the next day. Do not take two doses of Xarelto on the same day.   Important Safety Information A possible side effect of Xarelto is  bleeding. You should call your healthcare provider right away if you experience any of the following:   Bleeding from an injury or your nose that does not stop.   Unusual colored urine (red or dark [place]) or unusual colored stools (red or black).   Unusual bruising for unknown reasons.   A serious fall or if you hit your head (even if there is no bleeding).  Some medicines may interact with Xarelto and might increase your risk of bleeding while on Xarelto. To help avoid this, consult your healthcare provider or pharmacist prior to using any new prescription or non-prescription medications, including herbals, vitamins, non-steroidal anti-inflammatory drugs (NSAIDs) and supplements.  This website has more information on Xarelto: [email or web address]

## 2014-05-21 ENCOUNTER — Inpatient Hospital Stay (HOSPITAL_COMMUNITY): Payer: Medicare Other

## 2014-05-21 LAB — BASIC METABOLIC PANEL
ANION GAP: 11 (ref 5–15)
BUN: 13 mg/dL (ref 6–23)
CALCIUM: 8.4 mg/dL (ref 8.4–10.5)
CO2: 27 meq/L (ref 19–32)
CREATININE: 0.6 mg/dL (ref 0.50–1.10)
Chloride: 104 mEq/L (ref 96–112)
GFR calc Af Amer: >90 mL/min (ref >90)
GFR, EST NON AFRICAN AMERICAN: 80 mL/min — AB (ref >90)
Glucose, Bld: 131 mg/dL — ABNORMAL HIGH (ref 70–99)
Potassium: 3.9 mEq/L (ref 3.7–5.3)
Sodium: 142 mEq/L (ref 137–147)

## 2014-05-21 LAB — CBC
HCT: 27.7 % — ABNORMAL LOW (ref 36.0–46.0)
Hemoglobin: 8.8 g/dL — ABNORMAL LOW (ref 12.0–15.0)
MCH: 32.7 pg (ref 26.0–34.0)
MCHC: 31.8 g/dL (ref 30.0–36.0)
MCV: 103 fL — AB (ref 78.0–100.0)
PLATELETS: 324 10*3/uL (ref 150–400)
RBC: 2.69 MIL/uL — AB (ref 3.87–5.11)
RDW: 13.7 % (ref 11.5–15.5)
WBC: 20.1 10*3/uL — ABNORMAL HIGH (ref 4.0–10.5)

## 2014-05-21 NOTE — Progress Notes (Signed)
Physical Therapy Treatment Patient Details Name: Carla [name] MRN: [id number] DOB: August 17, 1926 Today's Date: 05/21/2014    History of Present Illness R tka.    PT Comments    Pt continues to improve in ROM and strength of R knee  Follow Up Recommendations  SNF     Equipment Recommendations  None recommended by PT    Recommendations for Other Services       Precautions / Restrictions Precautions Precautions: Fall;Knee    Mobility  Bed Mobility   Bed Mobility: Sit to Supine       Sit to supine: Min assist   General bed mobility comments: support R leg onto bed  Transfers Overall transfer level: Needs assistance Equipment used: Rolling walker (2 wheeled) Transfers: Sit to/from Stand Sit to Stand: Min assist Stand pivot transfers: Min assist       General transfer comment: cues for safety, hand and R leg position  Ambulation/Gait                 Stairs            Wheelchair Mobility    Modified Rankin (Stroke Patients Only)       Balance                                    Cognition Arousal/Alertness: Awake/alert                          Exercises Total Joint Exercises Ankle Circles/Pumps: AROM;Both;10 reps;Supine Short Arc Quad: AROM;Right;10 reps;Supine Hip ABduction/ADduction: AROM;Right;10 reps;Supine Straight Leg Raises: AAROM;Right;10 reps;Supine Goniometric ROM: 10-65    General Comments        Pertinent Vitals/Pain Pain Assessment: 0-10 Pain Score: 5  Pain Descriptors / Indicators: Aching;Constant Pain Intervention(s): Premedicated before session;Ice applied;Repositioned    Home Living                      Prior Function            PT Goals (current goals can now be found in the care plan section) Progress towards PT goals: Progressing toward goals    Frequency  7X/week    PT Plan Current plan remains appropriate    Co-evaluation             End of Session    Activity Tolerance: Patient tolerated treatment well Patient left: in bed;with call bell/phone within reach     Time: 1101-1126 PT Time Calculation (min): 25 min  Charges:  $Therapeutic Exercise: 8-22 mins $Therapeutic Activity: 8-22 mins                    G Codes:      [name] 05/21/2014, 12:27 PM
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
## 2014-05-22 DIAGNOSIS — N390 Urinary tract infection, site not specified: Secondary | ICD-10-CM | POA: Diagnosis not present

## 2014-05-22 DIAGNOSIS — I10 Essential (primary) hypertension: Secondary | ICD-10-CM | POA: Diagnosis not present

## 2014-05-22 DIAGNOSIS — M25519 Pain in unspecified shoulder: Secondary | ICD-10-CM | POA: Diagnosis not present

## 2014-05-22 DIAGNOSIS — IMO0002 Reserved for concepts with insufficient information to code with codable children: Secondary | ICD-10-CM | POA: Diagnosis not present

## 2014-05-22 DIAGNOSIS — J301 Allergic rhinitis due to pollen: Secondary | ICD-10-CM | POA: Diagnosis not present

## 2014-05-22 DIAGNOSIS — G8918 Other acute postprocedural pain: Secondary | ICD-10-CM | POA: Diagnosis not present

## 2014-05-22 DIAGNOSIS — R0602 Shortness of breath: Secondary | ICD-10-CM | POA: Diagnosis not present

## 2014-05-22 DIAGNOSIS — J9610 Chronic respiratory failure, unspecified whether with hypoxia or hypercapnia: Secondary | ICD-10-CM | POA: Diagnosis not present

## 2014-05-22 DIAGNOSIS — Z96659 Presence of unspecified artificial knee joint: Secondary | ICD-10-CM | POA: Diagnosis not present

## 2014-05-22 DIAGNOSIS — R0902 Hypoxemia: Secondary | ICD-10-CM | POA: Diagnosis not present

## 2014-05-22 DIAGNOSIS — K219 Gastro-esophageal reflux disease without esophagitis: Secondary | ICD-10-CM | POA: Diagnosis not present

## 2014-05-22 DIAGNOSIS — D62 Acute posthemorrhagic anemia: Secondary | ICD-10-CM | POA: Diagnosis not present

## 2014-05-22 DIAGNOSIS — C44519 Basal cell carcinoma of skin of other part of trunk: Secondary | ICD-10-CM | POA: Diagnosis not present

## 2014-05-22 DIAGNOSIS — Z471 Aftercare following joint replacement surgery: Secondary | ICD-10-CM | POA: Diagnosis not present

## 2014-05-22 DIAGNOSIS — R627 Adult failure to thrive: Secondary | ICD-10-CM | POA: Diagnosis not present

## 2014-05-22 DIAGNOSIS — J9620 Acute and chronic respiratory failure, unspecified whether with hypoxia or hypercapnia: Secondary | ICD-10-CM | POA: Diagnosis not present

## 2014-05-22 DIAGNOSIS — D649 Anemia, unspecified: Secondary | ICD-10-CM | POA: Diagnosis not present

## 2014-05-22 DIAGNOSIS — K5900 Constipation, unspecified: Secondary | ICD-10-CM | POA: Diagnosis not present

## 2014-05-22 LAB — CBC
HEMATOCRIT: 28 % — AB (ref 36.0–46.0)
Hemoglobin: 8.9 g/dL — ABNORMAL LOW (ref 12.0–15.0)
MCH: 32.7 pg (ref 26.0–34.0)
MCHC: 31.8 g/dL (ref 30.0–36.0)
MCV: 102.9 fL — ABNORMAL HIGH (ref 78.0–100.0)
PLATELETS: 317 10*3/uL (ref 150–400)
RBC: 2.72 MIL/uL — ABNORMAL LOW (ref 3.87–5.11)
RDW: 13.9 % (ref 11.5–15.5)
WBC: 13.5 10*3/uL — ABNORMAL HIGH (ref 4.0–10.5)

## 2014-05-22 MED ORDER — TRAMADOL HCL 50 MG PO TABS: 50.0000 mg | ORAL_TABLET | Freq: Four times a day (QID) | ORAL | Status: DC | PRN

## 2014-05-22 MED ORDER — POLYETHYLENE GLYCOL 3350 17 G PO PACK: 17.0000 g | PACK | Freq: Every day | ORAL | Status: DC | PRN

## 2014-05-22 MED ORDER — BISACODYL 10 MG RE SUPP: 10.0000 mg | Freq: Every day | RECTAL | Status: DC | PRN

## 2014-05-22 MED ORDER — OXYCODONE HCL 5 MG PO TABS: 5.0000 mg | ORAL_TABLET | ORAL | Status: DC | PRN

## 2014-05-22 MED ORDER — METHOCARBAMOL 500 MG PO TABS: 500.0000 mg | ORAL_TABLET | Freq: Four times a day (QID) | ORAL | Status: DC | PRN

## 2014-05-22 MED ORDER — METOCLOPRAMIDE HCL 5 MG PO TABS: 5.0000 mg | ORAL_TABLET | Freq: Three times a day (TID) | ORAL | Status: DC | PRN

## 2014-05-22 MED ORDER — DSS 100 MG PO CAPS: 100.0000 mg | ORAL_CAPSULE | Freq: Two times a day (BID) | ORAL | Status: DC

## 2014-05-22 MED ORDER — ACETAMINOPHEN 325 MG PO TABS: 650.0000 mg | ORAL_TABLET | Freq: Four times a day (QID) | ORAL | Status: DC | PRN

## 2014-05-22 MED ORDER — RIVAROXABAN 10 MG PO TABS: 10.0000 mg | ORAL_TABLET | Freq: Every day | ORAL | Status: DC

## 2014-05-22 MED ORDER — ONDANSETRON HCL 4 MG PO TABS: 4.0000 mg | ORAL_TABLET | Freq: Four times a day (QID) | ORAL | Status: DC | PRN

## 2014-05-22 MED ORDER — DIAZEPAM 5 MG PO TABS: 5.0000 mg | ORAL_TABLET | Freq: Four times a day (QID) | ORAL | Status: DC | PRN
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
## 2014-05-22 NOTE — Progress Notes (Signed)
Physical Therapy Treatment Patient Details Name: Carla [name] MRN: [id number] DOB: 21-Nov-1925 Today's Date: 05/22/2014    History of Present Illness R tka.    PT Comments    Patient appears more sleepy this AM. Plans ssnf today.  Follow Up Recommendations  SNF     Equipment Recommendations  None recommended by PT    Recommendations for Other Services       Precautions / Restrictions Precautions Precautions: Fall;Knee    Mobility  Bed Mobility                  Transfers Overall transfer level: Needs assistance Equipment used: Rolling walker (2 wheeled) Transfers: Sit to/from Stand   Stand pivot transfers: Mod assist       General transfer comment: R knee very stiff after sitting at the edge of the bed for breakfast aND KNEE WAS FLEXED NEAR TO 90*  Ambulation/Gait                 Stairs            Wheelchair Mobility    Modified Rankin (Stroke Patients Only)       Balance                                    Cognition Arousal/Alertness: Awake/alert                          Exercises      General Comments        Pertinent Vitals/Pain Pain Score: 5  Pain Descriptors / Indicators: Aching    Home Living                      Prior Function            PT Goals (current goals can now be found in the care plan section) Progress towards PT goals: Progressing toward goals    Frequency  7X/week    PT Plan Current plan remains appropriate    Co-evaluation             End of Session   Activity Tolerance: Patient limited by fatigue Patient left: in chair;with call bell/phone within reach     Time: 0825-0845 PT Time Calculation (min): 20 min  Charges:  $Gait Training: 8-22 mins                    G Codes:      [name] 05/22/2014, 1:37 PM
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
## 2014-05-23 NOTE — Progress Notes (Addendum)
Clinical Social Work Department CLINICAL SOCIAL WORK PLACEMENT NOTE 05/23/2014  Patient:  Carla [name], Carla [name]  Account Number:  [id number] Admit date:  05/19/2014  Clinical Social Worker:  Werner Lean, LCSW  Date/time:  05/23/2014 07:22 AM  Clinical Social Work is seeking post-discharge placement for this patient at the following level of care:   SKILLED NURSING   (*CSW will update this form in Epic as items are completed)     Patient/family provided with [place] Department of Clinical Social Work's list of facilities offering this level of care within the geographic area requested by the patient (or if unable, by the patient's family).  05/20/2014  Patient/family informed of their freedom to choose among providers that offer the needed level of care, that participate in Medicare, Medicaid or managed care program needed by the patient, have an available bed and are willing to accept the patient.    Patient/family informed of [facility]' ownership interest in [facility], as well as of the fact that they are under no obligation to receive care at this facility.  PASARR submitted to EDS on 05/20/2014 PASARR number received on 05/20/2014  FL2 transmitted to all facilities in geographic area requested by pt/family on  05/20/2014 FL2 transmitted to all facilities within larger geographic area on   Patient informed that his/her managed care company has contracts with or will negotiate with  certain facilities, including the following:     Patient/family informed of bed offers received:  05/20/2014 Patient chooses bed at [facility] ) Physician recommends and patient chooses bed at    Patient to be transferred to [place] [facility])  on  05/22/2014 Patient to be transferred to facility by DAUGHTER Patient and family notified of transfer on 05/22/2014 Name of family member notified:  DAUGHTER  The following physician request were entered in Epic:   Additional  Comments: Pt / daughter were in agreement with d/c to SNF via car. PT agreed with transport by car. NSG reviewed d/c summary, scripts , AVS. Scripts were included in d/c packet.  Werner Lean LCSW [phone]

## 2014-05-24 DIAGNOSIS — Z96659 Presence of unspecified artificial knee joint: Secondary | ICD-10-CM | POA: Diagnosis not present

## 2014-05-24 DIAGNOSIS — N390 Urinary tract infection, site not specified: Secondary | ICD-10-CM | POA: Diagnosis not present

## 2014-05-24 DIAGNOSIS — J9620 Acute and chronic respiratory failure, unspecified whether with hypoxia or hypercapnia: Secondary | ICD-10-CM | POA: Diagnosis not present

## 2014-05-24 DIAGNOSIS — G8918 Other acute postprocedural pain: Secondary | ICD-10-CM | POA: Diagnosis not present

## 2014-05-24 DIAGNOSIS — R0602 Shortness of breath: Secondary | ICD-10-CM | POA: Diagnosis not present

## 2014-05-24 DIAGNOSIS — D62 Acute posthemorrhagic anemia: Secondary | ICD-10-CM | POA: Diagnosis not present

## 2014-05-24 DIAGNOSIS — R0902 Hypoxemia: Secondary | ICD-10-CM | POA: Diagnosis not present

## 2014-05-27 DIAGNOSIS — I10 Essential (primary) hypertension: Secondary | ICD-10-CM | POA: Diagnosis not present

## 2014-05-27 DIAGNOSIS — Z96659 Presence of unspecified artificial knee joint: Secondary | ICD-10-CM | POA: Diagnosis not present

## 2014-05-27 DIAGNOSIS — M25519 Pain in unspecified shoulder: Secondary | ICD-10-CM | POA: Diagnosis not present

## 2014-05-27 DIAGNOSIS — Z471 Aftercare following joint replacement surgery: Secondary | ICD-10-CM | POA: Diagnosis not present

## 2014-05-27 DIAGNOSIS — K219 Gastro-esophageal reflux disease without esophagitis: Secondary | ICD-10-CM | POA: Diagnosis not present

## 2014-05-27 DIAGNOSIS — R627 Adult failure to thrive: Secondary | ICD-10-CM | POA: Diagnosis not present

## 2014-05-27 DIAGNOSIS — D649 Anemia, unspecified: Secondary | ICD-10-CM | POA: Diagnosis not present

## 2014-05-27 DIAGNOSIS — G8918 Other acute postprocedural pain: Secondary | ICD-10-CM | POA: Diagnosis not present

## 2014-05-27 DIAGNOSIS — M1710 Unilateral primary osteoarthritis, unspecified knee: Secondary | ICD-10-CM | POA: Diagnosis not present

## 2014-05-27 DIAGNOSIS — K5900 Constipation, unspecified: Secondary | ICD-10-CM | POA: Diagnosis not present

## 2014-05-27 DIAGNOSIS — J301 Allergic rhinitis due to pollen: Secondary | ICD-10-CM | POA: Diagnosis not present

## 2014-05-27 DIAGNOSIS — J9610 Chronic respiratory failure, unspecified whether with hypoxia or hypercapnia: Secondary | ICD-10-CM | POA: Diagnosis not present

## 2014-05-27 DIAGNOSIS — C44519 Basal cell carcinoma of skin of other part of trunk: Secondary | ICD-10-CM | POA: Diagnosis not present

## 2014-05-30 DIAGNOSIS — M25519 Pain in unspecified shoulder: Secondary | ICD-10-CM | POA: Diagnosis not present

## 2014-05-30 DIAGNOSIS — Z96659 Presence of unspecified artificial knee joint: Secondary | ICD-10-CM | POA: Diagnosis not present

## 2014-06-01 DIAGNOSIS — Z471 Aftercare following joint replacement surgery: Secondary | ICD-10-CM | POA: Diagnosis not present

## 2014-06-01 DIAGNOSIS — Z96659 Presence of unspecified artificial knee joint: Secondary | ICD-10-CM | POA: Diagnosis not present

## 2014-06-01 DIAGNOSIS — J45909 Unspecified asthma, uncomplicated: Secondary | ICD-10-CM | POA: Diagnosis not present

## 2014-06-01 DIAGNOSIS — I10 Essential (primary) hypertension: Secondary | ICD-10-CM | POA: Diagnosis not present

## 2014-06-24 DIAGNOSIS — Z471 Aftercare following joint replacement surgery: Secondary | ICD-10-CM | POA: Diagnosis not present

## 2014-07-22 ENCOUNTER — Ambulatory Visit (INDEPENDENT_AMBULATORY_CARE_PROVIDER_SITE_OTHER): Payer: Medicare Other | Admitting: Pulmonary Disease

## 2014-07-22 ENCOUNTER — Encounter: Payer: Self-pay | Admitting: Pulmonary Disease

## 2014-07-22 VITALS — BP 112/76 | HR 81 | Temp 97.5°F | Ht 61.0 in | Wt 129.2 lb

## 2014-07-22 DIAGNOSIS — R918 Other nonspecific abnormal finding of lung field: Secondary | ICD-10-CM | POA: Diagnosis not present

## 2014-07-22 DIAGNOSIS — J479 Bronchiectasis, uncomplicated: Secondary | ICD-10-CM | POA: Insufficient documentation

## 2014-07-22 DIAGNOSIS — Z23 Encounter for immunization: Secondary | ICD-10-CM

## 2014-07-22 DIAGNOSIS — R0600 Dyspnea, unspecified: Secondary | ICD-10-CM | POA: Diagnosis not present

## 2014-07-22 NOTE — Progress Notes (Signed)
   Subjective:    Patient ID: Carla [name], female    DOB: Jan 06, 1926, 80 y.o.   MRN: [id number]  HPI The patient is an 80 year old female who lives been asked to see for an abnormal CT chest. She had a scan in August of this year that showed bilateral rhonchi ectasis, as well as scattered nodular densities with tree in bud formation. She also had an 11 mm right lower lobe nodule. The patient has an intermittent cough that occurs primarily in the mornings, and produces minimal mucus. She does not think that she has a chronic cough that is bothersome to her. She does have some increasing dyspnea on exertion, but has no issues walking on flat ground for a long period of time. She will not get winded doing light housework, but will get short of breath walking up a flight of stairs. She also gets short of breath bringing groceries in from the car, but has to go up a few steps to get into the house. The patient states that her weight has been stable over the last 1 year, and although she denies anorexia, the family is concerned about her intake. She denies any history of childhood asthma, and has never smoked. She denies malaise or feeling poorly. She does have a history of GERD, with multiple endoscopies in the past. It should also be noted that I care for a family member who also has bronchiectasis.   Review of Systems  Constitutional: Negative for fever and unexpected weight change.  HENT: Negative for congestion, dental problem, ear pain, nosebleeds, postnasal drip, rhinorrhea, sinus pressure, sneezing, sore throat and trouble swallowing.        Seasonal allergies  Eyes: Negative for redness and itching.  Respiratory: Positive for cough, shortness of breath and wheezing. Negative for chest tightness.        At times  Cardiovascular: Negative for palpitations and leg swelling.  Gastrointestinal: Negative for nausea and vomiting.  Genitourinary: Negative for dysuria.  Musculoskeletal: Positive for  arthralgias and joint swelling.       Knee replacement in August 2015  Skin: Negative for rash.  Neurological: Negative for headaches.  Hematological: Does not bruise/bleed easily.  Psychiatric/Behavioral: Negative for dysphoric mood. The patient is not nervous/anxious.        Objective:   Physical Exam Constitutional:  Well developed, no acute distress  HENT:  Nares patent without discharge  Oropharynx without exudate, palate and uvula are normal  Eyes:  Perrla, eomi, no scleral icterus  Neck:  No JVD, no TMG  Cardiovascular:  Normal rate, regular rhythm, no rubs or gallops.  No murmurs        Intact distal pulses  Pulmonary :  Normal breath sounds, no stridor or respiratory distress   No rales, rhonchi, or wheezing  Abdominal:  Soft, nondistended, bowel sounds present.  No tenderness noted.   Musculoskeletal:  No lower extremity edema noted.  Lymph Nodes:  No cervical lymphadenopathy noted  Skin:  No cyanosis noted  Neurologic:  Alert, appropriate, moves all 4 extremities without obvious deficit.         Assessment & Plan:

## 2014-07-22 NOTE — Assessment & Plan Note (Signed)
Probably secondary to MAC, but she does have an 11 mm lesion in her right lower lobe that needs to be followed. Will check a CT chest 6 months from her prior.

## 2014-07-22 NOTE — Assessment & Plan Note (Signed)
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 I have had a long discussion with her about bronchiectasis, and feel that she is stable given the lack of chronic cough with ongoing mucus production. I have explained the difference between colonization versus infection with MAC, and that it is very difficult to treat and requires prolonged courses of antibiotics that are frequently poorly tolerated. I would not even consider treating her for this unless she is having significant constitutional symptoms, as well as debilitating cough with ongoing mucus production. She does have the suggestion of subtle airflow obstruction on her spirometry today, and because of her dyspnea on exertion, I would like to try her on a LABA.  I suspect that her bronchiectasis is either idiopathic or familial..

## 2014-07-22 NOTE — Patient Instructions (Signed)
You have bronchiectasis with the suggestion of colonization by an organism called MAC.  There is no treatment necessary at this point. Will try on striverdi 2 inhalations each am for the next 4 weeks.  If you see a difference, let us know and can send in a prescription.  If you do not see a difference, would not continue. Can stay on albuterol as needed for rescue. Will need a followup chest ct in 88mos. I would like to see you back in 50mos to check on how things are going.

## 2014-07-29 ENCOUNTER — Telehealth: Payer: Self-pay | Admitting: Pulmonary Disease

## 2014-07-29 NOTE — Telephone Encounter (Signed)
LMTCB

## 2014-07-30 MED ORDER — OLODATEROL HCL 2.5 MCG/ACT IN AERS: 2.0000 | INHALATION_SPRAY | Freq: Every morning | RESPIRATORY_TRACT | Status: DC

## 2014-07-30 NOTE — Telephone Encounter (Signed)
Form received and placed in [place] look at for when he return

## 2014-07-30 NOTE — Telephone Encounter (Signed)
I called [facility]. Was advised this medication is not covered by insurance. They tried to get a # so we can do PA but would not even generate a PA form for this medication.  Called pt and she gave me # to call [phone] ID # [id number] Called for PA and was advised they will send a fax for the doctor to fill out and sign (would not let me do over the phone).  This will be sent to fax upfront. Will await fax

## 2014-07-30 NOTE — Telephone Encounter (Signed)
Pt was seen by [facility] on 07/22/14 with the following recommends:  Patient Instructions     You have bronchiectasis with the suggestion of colonization by an organism called MAC. There is no treatment necessary at this point. Will try on striverdi 2 inhalations each am for the next 4 weeks. If you see a difference, let us know and can send in a prescription. If you do not see a difference, would not continue. Can stay on albuterol as needed for rescue. Will need a followup chest ct in 3mos. I would like to see you back in 58mos to check on how things are going.    --------  Spoke with pt who reports Striverdi is helping -- morning cough has greatly improved.  Pt states she spoke with [employer] who advised we would need to contact her insurance to do a Prior Authorization.  Pt requesting we call rx into [facility] and start PA process.  Advised we will keep her updated on process.  She verbalized understanding and requesting we leave a detailed msg on machine when calling back if she doesn't answer.  Note: Pt reports she has over 1 wk of Striverdi left.  Called [facility], Spoke with Juanda Crumble.  Gave VO for Striverdi.  Reports they do not yet have this in stock and is requesting to call us back later today to advise if medication is approved/PA needed.  Will await call.

## 2014-07-30 NOTE — Telephone Encounter (Signed)
The pharm called a/b the prescript that was just called in, it is not covered by pt insurance.Carla [name]

## 2014-08-04 NOTE — Telephone Encounter (Signed)
done

## 2014-08-04 NOTE — Telephone Encounter (Signed)
Pt aware of the below  Will forward back to [facility]

## 2014-08-05 NOTE — Telephone Encounter (Signed)
Mindy, did [facility] give this to you?  Thanks!

## 2014-08-05 NOTE — Telephone Encounter (Signed)
This has already been faxed back. Will await decision

## 2014-08-06 MED ORDER — INDACATEROL MALEATE 75 MCG IN CAPS: ORAL_CAPSULE | RESPIRATORY_TRACT | Status: DC

## 2014-08-06 NOTE — Telephone Encounter (Signed)
Can try arcapta one inhalation each am

## 2014-08-06 NOTE — Telephone Encounter (Signed)
Pt aware of recs. RX has been sent into the pharm. Nothing further needed

## 2014-08-06 NOTE — Telephone Encounter (Signed)
I spoke with [facility] and this PA has been denied. Its denied because pt must try and fail arcapta and foradil. If the doctor believe these medications will have adverse effects for the pt then an appeal can be submitted in the form of a letter stating medical necessity and they reason the above medications will not work for the pt. The appeal [place] [address]  Please advise. [place] Bing, CMA

## 2014-09-01 DIAGNOSIS — I10 Essential (primary) hypertension: Secondary | ICD-10-CM | POA: Diagnosis not present

## 2014-09-01 DIAGNOSIS — Z6835 Body mass index (BMI) 35.0-35.9, adult: Secondary | ICD-10-CM | POA: Diagnosis not present

## 2014-09-10 ENCOUNTER — Telehealth: Payer: Self-pay | Admitting: Pulmonary Disease

## 2014-09-10 NOTE — Telephone Encounter (Signed)
Called and spoke with pt and she is aware of [place] recs.  I have mailed the pt a pt assistance form to fill out and she will bring this back to the office to see if she can get some help with striverdi.  Will forward to [place] to follow up on.

## 2014-09-10 NOTE — Telephone Encounter (Signed)
striverdi probably works better because of the delivery device.  If too expensive, can see if she can qualify for the pt assistance program.??  If not, not sure what else to tell her.  Can use albuterol at bedtime to see if that helps.

## 2014-09-10 NOTE — Telephone Encounter (Signed)
Pt was not able to afford the striverdi it was over $150. Pt was sent in arcapta as an alternative. Pt reports this does not help her at all. On the striverdi she could tell a big difference in her breathing, hoarseness was 50% better and could breathe better at night. She wants to know what she should do? Is there something else she can try or similar to striverdi? Please advise thanks

## 2014-09-30 ENCOUNTER — Other Ambulatory Visit: Payer: Self-pay | Admitting: Pulmonary Disease

## 2014-09-30 MED ORDER — OLODATEROL HCL 2.5 MCG/ACT IN AERS: 2.0000 | INHALATION_SPRAY | Freq: Every morning | RESPIRATORY_TRACT | Status: DC

## 2014-10-27 DIAGNOSIS — Z0000 Encounter for general adult medical examination without abnormal findings: Secondary | ICD-10-CM | POA: Diagnosis not present

## 2014-10-27 DIAGNOSIS — I10 Essential (primary) hypertension: Secondary | ICD-10-CM | POA: Diagnosis not present

## 2014-11-05 ENCOUNTER — Ambulatory Visit (INDEPENDENT_AMBULATORY_CARE_PROVIDER_SITE_OTHER)
Admission: RE | Admit: 2014-11-05 | Discharge: 2014-11-05 | Disposition: A | Discharge status: Home / Self Care | Payer: Medicare Other | Source: Ambulatory Visit | Attending: Pulmonary Disease | Admitting: Pulmonary Disease

## 2014-11-05 DIAGNOSIS — R918 Other nonspecific abnormal finding of lung field: Secondary | ICD-10-CM

## 2014-11-05 DIAGNOSIS — J479 Bronchiectasis, uncomplicated: Secondary | ICD-10-CM | POA: Diagnosis not present

## 2014-11-05 DIAGNOSIS — I2510 Atherosclerotic heart disease of native coronary artery without angina pectoris: Secondary | ICD-10-CM | POA: Diagnosis not present

## 2014-11-06 ENCOUNTER — Ambulatory Visit: Payer: Self-pay | Admitting: Orthopedic Surgery

## 2014-11-06 ENCOUNTER — Telehealth: Payer: Self-pay | Admitting: Pulmonary Disease

## 2014-11-06 DIAGNOSIS — Z1389 Encounter for screening for other disorder: Secondary | ICD-10-CM | POA: Diagnosis not present

## 2014-11-06 DIAGNOSIS — I10 Essential (primary) hypertension: Secondary | ICD-10-CM | POA: Diagnosis not present

## 2014-11-06 DIAGNOSIS — Z0000 Encounter for general adult medical examination without abnormal findings: Secondary | ICD-10-CM | POA: Diagnosis not present

## 2014-11-06 DIAGNOSIS — A312 Disseminated mycobacterium avium-intracellulare complex (DMAC): Secondary | ICD-10-CM | POA: Diagnosis not present

## 2014-11-06 DIAGNOSIS — M1990 Unspecified osteoarthritis, unspecified site: Secondary | ICD-10-CM | POA: Diagnosis not present

## 2014-11-06 DIAGNOSIS — R918 Other nonspecific abnormal finding of lung field: Secondary | ICD-10-CM | POA: Diagnosis not present

## 2014-11-06 DIAGNOSIS — Z6824 Body mass index (BMI) 24.0-24.9, adult: Secondary | ICD-10-CM | POA: Diagnosis not present

## 2014-11-06 NOTE — Telephone Encounter (Signed)
Spoke with the pt and her family and went over the CT Chest results again  They each verbalized understanding and denied any questions  She asked about the status of pt assistance for [employer] has not seen any recent correspondence  I handed them the forms and they will fill out pt portion now and leave up front so we can give to [facility] to sign tomorrow  Will forward to [place]  Thanks!

## 2014-11-06 NOTE — Progress Notes (Signed)
Preoperative surgical orders have been place into the Epic hospital system for Carla [name] on 11/06/2014, 12:37 PM  by Mickel Crow for surgery on 12-08-2014.  Preop Total Knee orders including Experal, IV Tylenol, and IV Decadron as long as there are no contraindications to the above medications. [name], PA-C

## 2014-11-28 ENCOUNTER — Encounter (HOSPITAL_COMMUNITY): Payer: Self-pay

## 2014-12-03 ENCOUNTER — Encounter (HOSPITAL_COMMUNITY)
Admission: RE | Admit: 2014-12-03 | Discharge: 2014-12-03 | Disposition: A | Discharge status: Home / Self Care | Payer: Medicare Other | Source: Ambulatory Visit | Attending: Orthopedic Surgery | Admitting: Orthopedic Surgery

## 2014-12-03 ENCOUNTER — Encounter (HOSPITAL_COMMUNITY): Payer: Self-pay

## 2014-12-03 ENCOUNTER — Other Ambulatory Visit (HOSPITAL_COMMUNITY): Payer: Self-pay | Admitting: *Deleted

## 2014-12-03 DIAGNOSIS — I10 Essential (primary) hypertension: Secondary | ICD-10-CM | POA: Diagnosis not present

## 2014-12-03 DIAGNOSIS — M179 Osteoarthritis of knee, unspecified: Secondary | ICD-10-CM | POA: Diagnosis not present

## 2014-12-03 DIAGNOSIS — Z01818 Encounter for other preprocedural examination: Secondary | ICD-10-CM | POA: Insufficient documentation

## 2014-12-03 HISTORY — DX: Chronic or unspecified peptic ulcer, site unspecified, with hemorrhage: K27.4

## 2014-12-03 HISTORY — DX: Chronic or unspecified gastrojejunal ulcer with hemorrhage: K28.4

## 2014-12-03 HISTORY — DX: Pulmonary mycobacterial infection: A31.0

## 2014-12-03 LAB — URINALYSIS, ROUTINE W REFLEX MICROSCOPIC
Bilirubin Urine: NEGATIVE
GLUCOSE, UA: NEGATIVE mg/dL
HGB URINE DIPSTICK: NEGATIVE
Ketones, ur: NEGATIVE mg/dL
LEUKOCYTES UA: NEGATIVE
NITRITE: NEGATIVE
PH: 6.5 (ref 5.0–8.0)
Protein, ur: NEGATIVE mg/dL
SPECIFIC GRAVITY, URINE: 1.013 (ref 1.005–1.030)
UROBILINOGEN UA: 0.2 mg/dL (ref 0.0–1.0)

## 2014-12-03 LAB — COMPREHENSIVE METABOLIC PANEL
ALBUMIN: 4.1 g/dL (ref 3.5–5.2)
ALK PHOS: 113 U/L (ref 39–117)
ALT: 16 U/L (ref 0–35)
AST: 24 U/L (ref 0–37)
Anion gap: 7 (ref 5–15)
BILIRUBIN TOTAL: 0.5 mg/dL (ref 0.3–1.2)
BUN: 17 mg/dL (ref 6–23)
CO2: 32 mmol/L (ref 19–32)
Calcium: 9.2 mg/dL (ref 8.4–10.5)
Chloride: 99 mmol/L (ref 96–112)
Creatinine, Ser: 0.55 mg/dL (ref 0.50–1.10)
GFR, EST NON AFRICAN AMERICAN: 83 mL/min — AB (ref >90)
Glucose, Bld: 73 mg/dL (ref 70–99)
POTASSIUM: 3.3 mmol/L — AB (ref 3.5–5.1)
SODIUM: 138 mmol/L (ref 135–145)
Total Protein: 7.3 g/dL (ref 6.0–8.3)

## 2014-12-03 LAB — CBC
HEMATOCRIT: 41 % (ref 36.0–46.0)
Hemoglobin: 12.9 g/dL (ref 12.0–15.0)
MCH: 31.1 pg (ref 26.0–34.0)
MCHC: 31.5 g/dL (ref 30.0–36.0)
MCV: 98.8 fL (ref 78.0–100.0)
PLATELETS: 502 10*3/uL — AB (ref 150–400)
RBC: 4.15 MIL/uL (ref 3.87–5.11)
RDW: 14.3 % (ref 11.5–15.5)
WBC: 9.9 10*3/uL (ref 4.0–10.5)

## 2014-12-03 LAB — PROTIME-INR
INR: 1.08 (ref 0.00–1.49)
PROTHROMBIN TIME: 14.1 s (ref 11.6–15.2)

## 2014-12-03 LAB — APTT: APTT: 41 s — AB (ref 24–37)

## 2014-12-03 LAB — SURGICAL PCR SCREEN
MRSA, PCR: NEGATIVE
Staphylococcus aureus: NEGATIVE

## 2014-12-03 NOTE — Progress Notes (Signed)
NOTE FAXED TO DREW PERKINS;PATIENT DID NOT BRING ENVELOPE WITH MEDICAL HISTORY FROM YOUR OFFICE DAY OF PRE OP, PATIENT INSTRUCTED TO BRING ENVELOPE DAY OF SURGERY.

## 2014-12-03 NOTE — Progress Notes (Addendum)
Medical clearance note dr Allie Bossier on chart for 11-1414 surgery ekg 09-01-14 dr Allie Bossier on chart Chest ct 11-05-14 epic lov dr clance 07-22-14 epic

## 2014-12-03 NOTE — Patient Instructions (Addendum)
Carla [name]  12/03/2014   Your procedure is scheduled on: Monday March 14th, 2016  Report to [facility] Main  Entrance and follow signs to               [place] at 1045  AM.  Call this number if you have problems the morning of surgery [phone]   Remember: [place] not eat food :After Midnight Sunday night, may have clear liquids from midnight Sunday night until 745 am Monday, nothing by mouth after 745 am Monday.     Take these medicines the morning of surgery with A SIP OF WATER: Ventolin inhaler if needed and bring inhaler, Zyrtec, Flonase nasal spray, Omeprazole                               You may not have any metal on your body including hair pins and             piercings  Do not wear jewelry, make-up, lotions, powders or perfumes.             Do not wear nail polish.  Do not shave  48 hours prior to surgery.              Men may shave face and neck.   Do not bring valuables to the hospital. [place].  Contacts, dentures or bridgework may not be worn into surgery.  Leave suitcase in the car. After surgery it may be brought to your room.     Patients discharged the day of surgery will not be allowed to drive home.  Name and phone number of your driver:  Special Instructions: N/A              Please read over the following fact sheets you were given: _____________________________________________________________________                CLEAR LIQUID DIET   Foods Allowed                                                                     Foods Excluded  Coffee and tea, regular and decaf                             liquids that you cannot  Plain Jell-O in any flavor                                             see through such as: Fruit ices (not with fruit pulp)                                     milk, soups, orange juice  Iced Popsicles  All solid food Carbonated beverages, regular and diet                                    Cranberry, grape and apple juices Sports drinks like Gatorade Lightly seasoned clear broth or consume(fat free) Sugar, honey syrup  Sample Menu Breakfast                                Lunch                                     Supper Cranberry juice                    Beef broth                            Chicken broth Jell-O                                     Grape juice                           Apple juice Coffee or tea                        Jell-O                                      Popsicle                                                Coffee or tea                        Coffee or tea  _____________________________________________________________________  [facility] - Preparing for Surgery Before surgery, you can play an important role.  Because skin is not sterile, your skin needs to be as free of germs as possible.  You can reduce the number of germs on your skin by washing with CHG (chlorahexidine gluconate) soap before surgery.  CHG is an antiseptic cleaner which kills germs and bonds with the skin to continue killing germs even after washing. Please DO NOT use if you have an allergy to CHG or antibacterial soaps.  If your skin becomes reddened/irritated stop using the CHG and inform your nurse when you arrive at Short Stay. Do not shave (including legs and underarms) for at least 48 hours prior to the first CHG shower.  You may shave your face/neck. Please follow these instructions carefully:  1.  Shower with CHG Soap the night before surgery and the  morning of Surgery.  2.  If you choose to wash your hair, wash your hair first as usual with your  normal  shampoo.  3.  After you shampoo, rinse your hair and body thoroughly to remove the  shampoo.  4.  Use CHG as you would any other liquid soap.  You can apply chg directly  to the skin  and wash                       Gently with a scrungie or clean washcloth.  5.  Apply the CHG Soap to your body ONLY FROM THE NECK DOWN.   Do not use on face/ open                           Wound or open sores. Avoid contact with eyes, ears mouth and genitals (private parts).                       Wash face,  Genitals (private parts) with your normal soap.             6.  Wash thoroughly, paying special attention to the area where your surgery  will be performed.  7.  Thoroughly rinse your body with warm water from the neck down.  8.  DO NOT shower/wash with your normal soap after using and rinsing off  the CHG Soap.                9.  Pat yourself dry with a clean towel.            10.  Wear clean pajamas.            11.  Place clean sheets on your bed the night of your first shower and do not  sleep with pets. Day of Surgery : Do not apply any lotions/deodorants the morning of surgery.  Please wear clean clothes to the hospital/surgery center.  FAILURE TO FOLLOW THESE INSTRUCTIONS MAY RESULT IN THE CANCELLATION OF YOUR SURGERY PATIENT SIGNATURE_________________________________  NURSE SIGNATURE__________________________________  ________________________________________________________________________ [facility] - Preparing for Surgery Before surgery, you can play an important role.  Because skin is not sterile, your skin needs to be as free of germs as possible.  You can reduce the number of germs on your skin by washing with CHG (chlorahexidine gluconate) soap before surgery.  CHG is an antiseptic cleaner which kills germs and bonds with the skin to continue killing germs even after washing. Please DO NOT use if you have an allergy to CHG or antibacterial soaps.  If your skin becomes reddened/irritated stop using the CHG and inform your nurse when you arrive at Short Stay. Do not shave (including legs and underarms) for at least 48 hours prior to the first CHG shower.  You may shave your  face/neck. Please follow these instructions carefully:  1.  Shower with CHG Soap the night before surgery and the  morning of Surgery.  2.  If you choose to wash your hair, wash your hair first as usual with your  normal  shampoo.  3.  After you shampoo, rinse your hair and body thoroughly to remove the  shampoo.                           4.  Use CHG as you would any other liquid soap.  You can apply chg directly  to the skin and wash                       Gently with a scrungie or clean washcloth.  5.  Apply the  CHG Soap to your body ONLY FROM THE NECK DOWN.   Do not use on face/ open                           Wound or open sores. Avoid contact with eyes, ears mouth and genitals (private parts).                       Wash face,  Genitals (private parts) with your normal soap.             6.  Wash thoroughly, paying special attention to the area where your surgery  will be performed.  7.  Thoroughly rinse your body with warm water from the neck down.  8.  DO NOT shower/wash with your normal soap after using and rinsing off  the CHG Soap.                9.  Pat yourself dry with a clean towel.            10.  Wear clean pajamas.            11.  Place clean sheets on your bed the night of your first shower and do not  sleep with pets. Day of Surgery : Do not apply any lotions/deodorants the morning of surgery.  Please wear clean clothes to the hospital/surgery center.  FAILURE TO FOLLOW THESE INSTRUCTIONS MAY RESULT IN THE CANCELLATION OF YOUR SURGERY PATIENT SIGNATURE_________________________________  NURSE SIGNATURE__________________________________  ________________________________________________________________________   Adam [place]  An incentive spirometer is a tool that can help keep your lungs clear and active. This tool measures how well you are filling your lungs with each breath. Taking long deep breaths may help reverse or decrease the chance of developing breathing  (pulmonary) problems (especially infection) following:  A long period of time when you are unable to move or be active. BEFORE THE PROCEDURE   If the spirometer includes an indicator to show your best effort, your nurse or respiratory therapist will set it to a desired goal.  If possible, sit up straight or lean slightly forward. Try not to slouch.  Hold the incentive spirometer in an upright position. INSTRUCTIONS FOR USE   Sit on the edge of your bed if possible, or sit up as far as you can in bed or on a chair.  Hold the incentive spirometer in an upright position.  Breathe out normally.  Place the mouthpiece in your mouth and seal your lips tightly around it.  Breathe in slowly and as deeply as possible, raising the piston or the ball toward the top of the column.  Hold your breath for 3-5 seconds or for as long as possible. Allow the piston or ball to fall to the bottom of the column.  Remove the mouthpiece from your mouth and breathe out normally.  Rest for a few seconds and repeat Steps 1 through 7 at least 10 times every 1-2 hours when you are awake. Take your time and take a few normal breaths between deep breaths.  The spirometer may include an indicator to show your best effort. Use the indicator as a goal to work toward during each repetition.  After each set of 10 deep breaths, practice coughing to be sure your lungs are clear. If you have an incision (the cut made at the time of surgery), support your incision when coughing by placing a pillow or rolled up  towels firmly against it. Once you are able to get out of bed, walk around indoors and cough well. You may stop using the incentive spirometer when instructed by your caregiver.  RISKS AND COMPLICATIONS  Take your time so you do not get dizzy or light-headed.  If you are in pain, you may need to take or ask for pain medication before doing incentive spirometry. It is harder to take a deep breath if you are having  pain. AFTER USE  Rest and breathe slowly and easily.  It can be helpful to keep track of a log of your progress. Your caregiver can provide you with a simple table to help with this. If you are using the spirometer at home, follow these instructions: [place] IF:   You are having difficultly using the spirometer.  You have trouble using the spirometer as often as instructed.  Your pain medication is not giving enough relief while using the spirometer.  You develop fever of 100.5 F (38.1 C) or higher. SEEK IMMEDIATE MEDICAL CARE IF:   You cough up bloody sputum that had not been present before.  You develop fever of 102 F (38.9 C) or greater.  You develop worsening pain at or near the incision site. MAKE SURE YOU:   Understand these instructions.  Will watch your condition.  Will get help right away if you are not doing well or get worse. Document Released: 01/23/2007 Document Revised: 12/05/2011 Document Reviewed: 03/26/2007 ExitCare Patient Information 2014 ExitCare, [place].   ________________________________________________________________________  WHAT IS A BLOOD TRANSFUSION? Blood Transfusion Information  A transfusion is the replacement of blood or some of its parts. Blood is made up of multiple cells which provide different functions.  Red blood cells carry oxygen and are used for blood loss replacement.  White blood cells fight against infection.  Platelets control bleeding.  Plasma helps clot blood.  Other blood products are available for specialized needs, such as hemophilia or other clotting disorders. BEFORE THE TRANSFUSION  Who gives blood for transfusions?   Healthy volunteers who are fully evaluated to make sure their blood is safe. This is blood bank blood. Transfusion therapy is the safest it has ever been in the practice of medicine. Before blood is taken from a donor, a complete history is taken to make sure that person has no history  of diseases nor engages in risky social behavior (examples are intravenous drug use or sexual activity with multiple partners). The donor's travel history is screened to minimize risk of transmitting infections, such as malaria. The donated blood is tested for signs of infectious diseases, such as HIV and hepatitis. The blood is then tested to be sure it is compatible with you in order to minimize the chance of a transfusion reaction. If you or a relative donates blood, this is often done in anticipation of surgery and is not appropriate for emergency situations. It takes many days to process the donated blood. RISKS AND COMPLICATIONS Although transfusion therapy is very safe and saves many lives, the main dangers of transfusion include:   Getting an infectious disease.  Developing a transfusion reaction. This is an allergic reaction to something in the blood you were given. Every precaution is taken to prevent this. The decision to have a blood transfusion has been considered carefully by your caregiver before blood is given. Blood is not given unless the benefits outweigh the risks. AFTER THE TRANSFUSION  Right after receiving a blood transfusion, you will usually feel much  better and more energetic. This is especially true if your red blood cells have gotten low (anemic). The transfusion raises the level of the red blood cells which carry oxygen, and this usually causes an energy increase.  The nurse administering the transfusion will monitor you carefully for complications. HOME CARE INSTRUCTIONS  No special instructions are needed after a transfusion. You may find your energy is better. Speak with your caregiver about any limitations on activity for underlying diseases you may have. SEEK MEDICAL CARE IF:   Your condition is not improving after your transfusion.  You develop redness or irritation at the intravenous (IV) site. SEEK IMMEDIATE MEDICAL CARE IF:  Any of the following symptoms  occur over the next 12 hours:  Shaking chills.  You have a temperature by mouth above 102 F (38.9 C), not controlled by medicine.  Chest, back, or muscle pain.  People around you feel you are not acting correctly or are confused.  Shortness of breath or difficulty breathing.  Dizziness and fainting.  You get a rash or develop hives.  You have a decrease in urine output.  Your urine turns a dark color or changes to pink, red, or [place]. Any of the following symptoms occur over the next 10 days:  You have a temperature by mouth above 102 F (38.9 C), not controlled by medicine.  Shortness of breath.  Weakness after normal activity.  The white part of the eye turns yellow (jaundice).  You have a decrease in the amount of urine or are urinating less often.  Your urine turns a dark color or changes to pink, red, or [place]. Document Released: 09/09/2000 Document Revised: 12/05/2011 Document Reviewed: 04/28/2008 [facility] Patient Information 2014 [place], [place].  _______________________________________________________________________

## 2014-12-07 ENCOUNTER — Ambulatory Visit: Payer: Self-pay | Admitting: Orthopedic Surgery

## 2014-12-07 NOTE — H&P (Signed)
Carla [name]. Carla [name] DOB: Feb 05, 1926 Married / Language: English / Race: White Female Date of Admission:  12/08/2014 CC:  Left Knee Pain History of Present Illness (Oral Hallgren L. [name] III PA-C; 11/23/2014 4:34 PM) The patient is a 81 year old female who comes in for a preoperative History and Physical. The patient is scheduled for a left total knee arthroplasty to be performed by Dr. [name]. [name], MD at [facility] on 12-08-2014. The patient comes in several months out from right total knee arthroplasty. The patient states that she is doing well at this time. The pain is controlled at this time and describe their pain as mild (laterally). They are currently on no medication for their pain. She is doing great in regard to the right knee. She is real pleased with that. The left knee is giving her a tremendous amount of trouble. She is having a hard time getting around because of the left knee. She is ready to go ahead and get that knee fixed. She has documented end-stage arthritis in that knee. They have been treated conservatively in the past for the above stated problem and despite conservative measures, they continue to have progressive pain and severe functional limitations and dysfunction. They have failed non-operative management including home exercise, medications, and injections. It is felt that they would benefit from undergoing total joint replacement. Risks and benefits of the procedure have been discussed with the patient and they elect to proceed with surgery. There are no active contraindications to surgery such as ongoing infection or rapidly progressive neurological disease.  Problem List/Past Medical (Carla [name] [name], III PA-C; 11/18/2014 3:50 PM) Right knee pain (M25.561) Left shoulder pain (M25.512) Primary osteoarthritis of both knees (M17.0) Status post total right knee replacement (O67.124) Chronic Pain Asthma Bronchitis Gastroesophageal Reflux  Disease Ulcer disease Gastritis Barrett's Esophagus Osteoarthritis High blood pressure Anxiety Disorder Anemia Osteoporosis Skin Cancer Squamous Cell on Nose Impaired Hearing Bilateral Hearing Aids Shingles  Allergies No Know Drug Allergies  Intolerance Aspirin *ANALGESICS - NonNarcotic* she is sensitive to aspirin products due to GI effects Dolobid *ANALGESICS - NonNarcotic* she is sensitive to this medication due to GI effects  Family History (Precilla [name] [name], III PA-C; 11/18/2014 3:44 PM) Hypertension Father, Mother, Sister. Heart Disease Father, Mother. Osteoarthritis Brother, Father, Mother, Sister. child Osteoporosis Mother. Cancer First Degree Relatives. child First Degree Relatives reported Cerebrovascular Accident Sister.  Social History (Carla [name] [name], III PA-C; 11/18/2014 3:44 PM) Not under pain contract No history of drug/alcohol rehab Exercise Exercises never Children 2 Living situation live alone Former drinker 09/03/2013: In the past drank Tobacco use Never smoker. 09/03/2013 Number of flights of stairs before winded less than 1 Tobacco / smoke exposure 09/03/2013: no Current work status retired Marital status widowed  Medication History ([name] [name], III PA-C; 11/18/2014 3:47 PM) Oxycodone (prn) Active. Robaxin (prn) Active. Glucosamine Complex (Oral) Active. Omeprazole (40MG  Capsule DR, Oral) Active. Losartan Potassium-HCTZ (50-12.5MG  Tablet, Oral) Active. Estradiol (0.1MG /24HR Patch Weekly, Transdermal) Active. Diazepam (5MG  Tablet, Oral) Active. Fluticasone Propionate (50MCG/ACT Suspension, Nasal) Active. Ventolin HFA (108 (90 Base)MCG/ACT Aerosol Soln, Inhalation) Active. (prn) ZyrTEC (10MG  Tablet, Oral) Active. Bilberry (1000MG  Capsule, Oral) Active. Biotin Maximum Strength (Oral) Specific dose unknown - Active. (10,000 with 100mg  Keratin) Aspirin Low Strength (81MG  Tablet  Chewable, Oral) Active.  Past Surgical History (Carla [name] [name], III PA-C; 11/18/2014 3:44 PM) Hysterectomy complete (non-cancerous) Cataract Surgery bilateral Rotator Cuff Repair right Arthroscopy of Knee right   Review of Systems ([name]  III PA-C; 11/18/2014 3:51 PM) General Present- Fatigue and Night Sweats. Not Present- Chills, Fever, Memory Loss, Weight Gain and Weight Loss. Skin Not Present- Eczema, Hives, Itching, Lesions and Rash. HEENT Not Present- Dentures, Double Vision, Headache, Hearing Loss, Tinnitus and Visual Loss. Respiratory Present- Cough and Wheezing. Not Present- Allergies, Chronic Cough, Coughing up blood, Shortness of breath at rest and Shortness of breath with exertion. Cardiovascular Not Present- Chest Pain, Difficulty Breathing Lying Down, Murmur, Palpitations, Racing/skipping heartbeats and Swelling. Gastrointestinal Not Present- Abdominal Pain, Bloody Stool, Constipation, Diarrhea, Difficulty Swallowing, Heartburn, Jaundice, Loss of appetitie, Nausea and Vomiting. Female Genitourinary Not Present- Blood in Urine, Discharge, Flank Pain, Incontinence, Painful Urination, Urgency, Urinary frequency, Urinary Retention, Urinating at Night and Weak urinary stream. Musculoskeletal Present- Back Pain and Joint Pain. Not Present- Joint Swelling, Morning Stiffness, Muscle Pain, Muscle Weakness and Spasms. Neurological Not Present- Blackout spells, Difficulty with balance, Dizziness, Paralysis, Tremor and Weakness. Psychiatric Not Present- Insomnia  Vitals ([name] III PA-C; 11/18/2014 3:57 PM) 11/18/2014 3:55 PM Weight: 126 lb Height: 61in Weight was reported by patient. Height was reported by patient. Body Surface Area: 1.55 m Body Mass Index: 23.81 kg/m  BP: 130/72 (Sitting, Right Arm, Standard)  Physical Exam (Sonnet Rizor L. Para Cossey III PA-C; 11/18/2014 3:59 PM) General Mental Status -Alert, cooperative and good  historian. General Appearance-pleasant, Not in acute distress. Orientation-Oriented X3. Build & Nutrition-Well nourished and Well developed.  Head and Neck Head-normocephalic, atraumatic . Neck Global Assessment - supple, no bruit auscultated on the right, no bruit auscultated on the left. Note: bilateral hearing aids   Eye Vision-Wears corrective lenses. Pupil - Bilateral-Regular and Round. Motion - Bilateral-EOMI.  Chest and Lung Exam Auscultation Breath sounds - clear at anterior chest wall and clear at posterior chest wall. Adventitious sounds - No Adventitious sounds.  Cardiovascular Auscultation Rhythm - Regular rate and rhythm. Heart Sounds - S1 WNL and S2 WNL. Murmurs & Other Heart Sounds: Murmur 1 - Location - Aortic Area and Pulmonic Area. Timing - Holosystolic. Grade - III/VI. Character - Low pitched.  Abdomen Palpation/Percussion Tenderness - Abdomen is non-tender to palpation. Rigidity (guarding) - Abdomen is soft. Auscultation Auscultation of the abdomen reveals - Bowel sounds normal.  Female Genitourinary Note: Not done, not pertinent to present illness   Musculoskeletal Note: On examination, she is alert and oriented in no apparent distress. The right knee shows no swelling. Range of motion is zero to 128. There is no tenderness or instability. The left knee has no efusion. There is marked crepitus on range of motion with slight valgus. Range of 5 to 120 with no instability.   Assessment & Plan (Kairee Isa L. Oneill Bais III PA-C; 11/18/2014 4:00 PM) Primary osteoarthritis of left knee (M17.12) Note:Surgical Plans: Left Total Knee Repalcement  Disposition: [facility]  PCP: Dr. Allie Bossier - Patient has been seen preoperatively and felt to be stable for surgery. She had an EKG done by Dr. Allie Bossier and showed no ischemia.  IV TXA  Anesthesia Issues: NONE  Signed electronically by Joelene Millin, III PA-C

## 2014-12-08 ENCOUNTER — Inpatient Hospital Stay (HOSPITAL_COMMUNITY)
Admission: RE | Admit: 2014-12-08 | Discharge: 2014-12-11 | DRG: 470 | Disposition: A | Discharge status: Skilled Nursing Facility | Payer: Medicare Other | Source: Ambulatory Visit | Attending: Orthopedic Surgery | Admitting: Orthopedic Surgery

## 2014-12-08 ENCOUNTER — Encounter (HOSPITAL_COMMUNITY)
Admission: RE | Disposition: A | Discharge status: Skilled Nursing Facility | Payer: Self-pay | Source: Ambulatory Visit | Attending: Orthopedic Surgery

## 2014-12-08 ENCOUNTER — Encounter (HOSPITAL_COMMUNITY): Payer: Self-pay

## 2014-12-08 ENCOUNTER — Inpatient Hospital Stay (HOSPITAL_COMMUNITY): Payer: Medicare Other | Admitting: Registered Nurse

## 2014-12-08 DIAGNOSIS — M25562 Pain in left knee: Secondary | ICD-10-CM | POA: Diagnosis not present

## 2014-12-08 DIAGNOSIS — Z96652 Presence of left artificial knee joint: Secondary | ICD-10-CM | POA: Diagnosis not present

## 2014-12-08 DIAGNOSIS — D62 Acute posthemorrhagic anemia: Secondary | ICD-10-CM | POA: Diagnosis not present

## 2014-12-08 DIAGNOSIS — K219 Gastro-esophageal reflux disease without esophagitis: Secondary | ICD-10-CM | POA: Diagnosis not present

## 2014-12-08 DIAGNOSIS — I10 Essential (primary) hypertension: Secondary | ICD-10-CM | POA: Diagnosis present

## 2014-12-08 DIAGNOSIS — Z79899 Other long term (current) drug therapy: Secondary | ICD-10-CM

## 2014-12-08 DIAGNOSIS — Z01812 Encounter for preprocedural laboratory examination: Secondary | ICD-10-CM

## 2014-12-08 DIAGNOSIS — M1712 Unilateral primary osteoarthritis, left knee: Secondary | ICD-10-CM

## 2014-12-08 DIAGNOSIS — Z96651 Presence of right artificial knee joint: Secondary | ICD-10-CM | POA: Diagnosis present

## 2014-12-08 DIAGNOSIS — M179 Osteoarthritis of knee, unspecified: Secondary | ICD-10-CM | POA: Diagnosis present

## 2014-12-08 DIAGNOSIS — M810 Age-related osteoporosis without current pathological fracture: Secondary | ICD-10-CM | POA: Diagnosis present

## 2014-12-08 DIAGNOSIS — Z471 Aftercare following joint replacement surgery: Secondary | ICD-10-CM | POA: Diagnosis not present

## 2014-12-08 DIAGNOSIS — H9193 Unspecified hearing loss, bilateral: Secondary | ICD-10-CM | POA: Diagnosis present

## 2014-12-08 DIAGNOSIS — J45909 Unspecified asthma, uncomplicated: Secondary | ICD-10-CM | POA: Diagnosis present

## 2014-12-08 DIAGNOSIS — Z7982 Long term (current) use of aspirin: Secondary | ICD-10-CM

## 2014-12-08 DIAGNOSIS — M1990 Unspecified osteoarthritis, unspecified site: Secondary | ICD-10-CM | POA: Diagnosis not present

## 2014-12-08 DIAGNOSIS — M1710 Unilateral primary osteoarthritis, unspecified knee: Secondary | ICD-10-CM | POA: Diagnosis present

## 2014-12-08 HISTORY — PX: TOTAL KNEE ARTHROPLASTY: SHX125

## 2014-12-08 LAB — TYPE AND SCREEN
ABO/RH(D): O POS
Antibody Screen: NEGATIVE

## 2014-12-08 SURGERY — ARTHROPLASTY, KNEE, TOTAL
Anesthesia: Spinal | Site: Knee | Laterality: Left

## 2014-12-08 MED ORDER — PROPOFOL INFUSION 10 MG/ML OPTIME
INTRAVENOUS | Status: DC | PRN
  Administered 2014-12-08: 100 ug/kg/min via INTRAVENOUS

## 2014-12-08 MED ORDER — ACETAMINOPHEN 10 MG/ML IV SOLN
1000.0000 mg | Freq: Once | INTRAVENOUS | Status: AC
  Administered 2014-12-08: 1000 mg via INTRAVENOUS
  Filled 2014-12-08: qty 100

## 2014-12-08 MED ORDER — BUPIVACAINE IN DEXTROSE 0.75-8.25 % IT SOLN
INTRATHECAL | Status: DC | PRN
  Administered 2014-12-08: 1.6 mL via INTRATHECAL

## 2014-12-08 MED ORDER — DIPHENHYDRAMINE HCL 12.5 MG/5ML PO ELIX: 12.5000 mg | ORAL_SOLUTION | ORAL | Status: DC | PRN

## 2014-12-08 MED ORDER — DEXAMETHASONE SODIUM PHOSPHATE 10 MG/ML IJ SOLN
INTRAMUSCULAR | Status: AC
  Filled 2014-12-08: qty 1

## 2014-12-08 MED ORDER — FLEET ENEMA 7-19 GM/118ML RE ENEM: 1.0000 | ENEMA | Freq: Once | RECTAL | Status: AC | PRN

## 2014-12-08 MED ORDER — PHENYLEPHRINE HCL 10 MG/ML IJ SOLN
INTRAMUSCULAR | Status: DC | PRN
  Administered 2014-12-08: 80 ug via INTRAVENOUS

## 2014-12-08 MED ORDER — HYDROMORPHONE HCL 1 MG/ML IJ SOLN
INTRAMUSCULAR | Status: AC
  Filled 2014-12-08: qty 1

## 2014-12-08 MED ORDER — SODIUM CHLORIDE 0.9 % IR SOLN
Status: DC | PRN
  Administered 2014-12-08: 1000 mL

## 2014-12-08 MED ORDER — FLUTICASONE PROPIONATE 50 MCG/ACT NA SUSP
1.0000 | Freq: Every day | NASAL | Status: DC
  Administered 2014-12-09 – 2014-12-10 (×2): 1 via NASAL
  Filled 2014-12-08: qty 16

## 2014-12-08 MED ORDER — METOCLOPRAMIDE HCL 5 MG/ML IJ SOLN: 5.0000 mg | Freq: Three times a day (TID) | INTRAMUSCULAR | Status: DC | PRN

## 2014-12-08 MED ORDER — PROPOFOL 10 MG/ML IV BOLUS
INTRAVENOUS | Status: AC
  Filled 2014-12-08: qty 20

## 2014-12-08 MED ORDER — ONDANSETRON HCL 4 MG PO TABS: 4.0000 mg | ORAL_TABLET | Freq: Four times a day (QID) | ORAL | Status: DC | PRN

## 2014-12-08 MED ORDER — POTASSIUM CHLORIDE IN NACL 20-0.9 MEQ/L-% IV SOLN
INTRAVENOUS | Status: AC
  Filled 2014-12-08: qty 1000

## 2014-12-08 MED ORDER — CEFAZOLIN SODIUM-DEXTROSE 2-3 GM-% IV SOLR
2.0000 g | Freq: Four times a day (QID) | INTRAVENOUS | Status: AC
  Administered 2014-12-08 – 2014-12-09 (×2): 2 g via INTRAVENOUS
  Filled 2014-12-08 (×2): qty 50

## 2014-12-08 MED ORDER — PHENYLEPHRINE HCL 10 MG/ML IJ SOLN
INTRAMUSCULAR | Status: AC
  Filled 2014-12-08: qty 1

## 2014-12-08 MED ORDER — DOCUSATE SODIUM 100 MG PO CAPS
100.0000 mg | ORAL_CAPSULE | Freq: Two times a day (BID) | ORAL | Status: DC
  Administered 2014-12-08 – 2014-12-11 (×6): 100 mg via ORAL

## 2014-12-08 MED ORDER — PROMETHAZINE HCL 25 MG/ML IJ SOLN: 6.2500 mg | INTRAMUSCULAR | Status: DC | PRN

## 2014-12-08 MED ORDER — MEPERIDINE HCL 50 MG/ML IJ SOLN: 6.2500 mg | INTRAMUSCULAR | Status: DC | PRN

## 2014-12-08 MED ORDER — ACETAMINOPHEN 650 MG RE SUPP: 650.0000 mg | Freq: Four times a day (QID) | RECTAL | Status: DC | PRN

## 2014-12-08 MED ORDER — PHENYLEPHRINE HCL 10 MG/ML IJ SOLN
10.0000 mg | INTRAVENOUS | Status: DC | PRN
  Administered 2014-12-08: 10 ug/min via INTRAVENOUS

## 2014-12-08 MED ORDER — LACTATED RINGERS IV SOLN
INTRAVENOUS | Status: DC
  Administered 2014-12-08: 1000 mL via INTRAVENOUS

## 2014-12-08 MED ORDER — PANTOPRAZOLE SODIUM 40 MG PO TBEC
80.0000 mg | DELAYED_RELEASE_TABLET | Freq: Every day | ORAL | Status: DC
  Filled 2014-12-08: qty 2

## 2014-12-08 MED ORDER — ONDANSETRON HCL 4 MG/2ML IJ SOLN: 4.0000 mg | Freq: Four times a day (QID) | INTRAMUSCULAR | Status: DC | PRN

## 2014-12-08 MED ORDER — RIVAROXABAN 10 MG PO TABS
10.0000 mg | ORAL_TABLET | Freq: Every day | ORAL | Status: DC
  Administered 2014-12-09 – 2014-12-11 (×3): 10 mg via ORAL
  Filled 2014-12-08 (×4): qty 1

## 2014-12-08 MED ORDER — FENTANYL CITRATE 0.05 MG/ML IJ SOLN
INTRAMUSCULAR | Status: DC | PRN
  Administered 2014-12-08: 50 ug via INTRAVENOUS

## 2014-12-08 MED ORDER — TRAMADOL HCL 50 MG PO TABS
50.0000 mg | ORAL_TABLET | Freq: Four times a day (QID) | ORAL | Status: DC | PRN
  Administered 2014-12-08: 50 mg via ORAL
  Filled 2014-12-08: qty 1

## 2014-12-08 MED ORDER — BUPIVACAINE LIPOSOME 1.3 % IJ SUSP
20.0000 mL | Freq: Once | INTRAMUSCULAR | Status: DC
  Filled 2014-12-08: qty 20

## 2014-12-08 MED ORDER — METOCLOPRAMIDE HCL 10 MG PO TABS: 5.0000 mg | ORAL_TABLET | Freq: Three times a day (TID) | ORAL | Status: DC | PRN

## 2014-12-08 MED ORDER — ALBUTEROL SULFATE (2.5 MG/3ML) 0.083% IN NEBU: 3.0000 mL | INHALATION_SOLUTION | Freq: Four times a day (QID) | RESPIRATORY_TRACT | Status: DC | PRN

## 2014-12-08 MED ORDER — PHENOL 1.4 % MT LIQD: 1.0000 | OROMUCOSAL | Status: DC | PRN

## 2014-12-08 MED ORDER — FENTANYL CITRATE 0.05 MG/ML IJ SOLN
INTRAMUSCULAR | Status: AC
  Filled 2014-12-08: qty 2

## 2014-12-08 MED ORDER — POTASSIUM CHLORIDE IN NACL 20-0.9 MEQ/L-% IV SOLN
INTRAVENOUS | Status: DC
  Filled 2014-12-08 (×3): qty 1000

## 2014-12-08 MED ORDER — DEXAMETHASONE SODIUM PHOSPHATE 10 MG/ML IJ SOLN
10.0000 mg | Freq: Once | INTRAMUSCULAR | Status: AC
  Administered 2014-12-08: 10 mg via INTRAVENOUS

## 2014-12-08 MED ORDER — LORATADINE 10 MG PO TABS
10.0000 mg | ORAL_TABLET | Freq: Every day | ORAL | Status: DC
  Administered 2014-12-09 – 2014-12-11 (×3): 10 mg via ORAL
  Filled 2014-12-08 (×3): qty 1

## 2014-12-08 MED ORDER — SODIUM CHLORIDE 0.9 % IV SOLN: INTRAVENOUS | Status: DC

## 2014-12-08 MED ORDER — LOSARTAN POTASSIUM-HCTZ 50-12.5 MG PO TABS
1.0000 | ORAL_TABLET | ORAL | Status: DC
Start: 2014-12-09 — End: 2014-12-08

## 2014-12-08 MED ORDER — TRANEXAMIC ACID 100 MG/ML IV SOLN
1000.0000 mg | INTRAVENOUS | Status: AC
  Administered 2014-12-08: 1000 mg via INTRAVENOUS
  Filled 2014-12-08: qty 10

## 2014-12-08 MED ORDER — PHENYLEPHRINE 40 MCG/ML (10ML) SYRINGE FOR IV PUSH (FOR BLOOD PRESSURE SUPPORT)
PREFILLED_SYRINGE | INTRAVENOUS | Status: AC
  Filled 2014-12-08: qty 10

## 2014-12-08 MED ORDER — LACTATED RINGERS IV SOLN
INTRAVENOUS | Status: DC | PRN
  Administered 2014-12-08 (×2): via INTRAVENOUS

## 2014-12-08 MED ORDER — SODIUM CHLORIDE 0.9 % IJ SOLN
INTRAMUSCULAR | Status: AC
  Filled 2014-12-08: qty 50

## 2014-12-08 MED ORDER — DEXTROSE 5 % IV SOLN
500.0000 mg | Freq: Four times a day (QID) | INTRAVENOUS | Status: DC | PRN
  Administered 2014-12-08: 500 mg via INTRAVENOUS
  Filled 2014-12-08 (×2): qty 5

## 2014-12-08 MED ORDER — DIAZEPAM 5 MG PO TABS
5.0000 mg | ORAL_TABLET | Freq: Every day | ORAL | Status: DC
  Administered 2014-12-08 – 2014-12-10 (×3): 5 mg via ORAL
  Filled 2014-12-08 (×4): qty 1

## 2014-12-08 MED ORDER — BUPIVACAINE HCL 0.25 % IJ SOLN
INTRAMUSCULAR | Status: DC | PRN
  Administered 2014-12-08: 20 mL

## 2014-12-08 MED ORDER — OXYCODONE HCL 5 MG PO TABS
5.0000 mg | ORAL_TABLET | ORAL | Status: DC | PRN
  Administered 2014-12-08 – 2014-12-10 (×7): 10 mg via ORAL
  Administered 2014-12-10 – 2014-12-11 (×4): 5 mg via ORAL
  Filled 2014-12-08: qty 2
  Filled 2014-12-08: qty 1
  Filled 2014-12-08 (×2): qty 2
  Filled 2014-12-08: qty 1
  Filled 2014-12-08 (×2): qty 2
  Filled 2014-12-08: qty 1
  Filled 2014-12-08 (×2): qty 2
  Filled 2014-12-08: qty 1

## 2014-12-08 MED ORDER — FENTANYL CITRATE 0.05 MG/ML IJ SOLN
25.0000 ug | INTRAMUSCULAR | Status: DC | PRN
  Administered 2014-12-08 (×2): 50 ug via INTRAVENOUS

## 2014-12-08 MED ORDER — MIDAZOLAM HCL 5 MG/5ML IJ SOLN
INTRAMUSCULAR | Status: DC | PRN
  Administered 2014-12-08 (×2): 0.5 mg via INTRAVENOUS

## 2014-12-08 MED ORDER — LOSARTAN POTASSIUM 50 MG PO TABS
50.0000 mg | ORAL_TABLET | Freq: Every day | ORAL | Status: DC
  Administered 2014-12-10 – 2014-12-11 (×2): 50 mg via ORAL
  Filled 2014-12-08 (×3): qty 1

## 2014-12-08 MED ORDER — FENTANYL CITRATE 0.05 MG/ML IJ SOLN
INTRAMUSCULAR | Status: AC
Start: 2014-12-08 — End: 2014-12-09
  Filled 2014-12-08: qty 2

## 2014-12-08 MED ORDER — BISACODYL 10 MG RE SUPP: 10.0000 mg | Freq: Every day | RECTAL | Status: DC | PRN

## 2014-12-08 MED ORDER — POLYETHYLENE GLYCOL 3350 17 G PO PACK: 17.0000 g | PACK | Freq: Every day | ORAL | Status: DC | PRN

## 2014-12-08 MED ORDER — CEFAZOLIN SODIUM-DEXTROSE 2-3 GM-% IV SOLR
2.0000 g | INTRAVENOUS | Status: AC
  Administered 2014-12-08: 2 g via INTRAVENOUS

## 2014-12-08 MED ORDER — MIDAZOLAM HCL 2 MG/2ML IJ SOLN
INTRAMUSCULAR | Status: AC
  Filled 2014-12-08: qty 2

## 2014-12-08 MED ORDER — METHOCARBAMOL 500 MG PO TABS
500.0000 mg | ORAL_TABLET | Freq: Four times a day (QID) | ORAL | Status: DC | PRN
  Administered 2014-12-09 – 2014-12-11 (×6): 500 mg via ORAL
  Filled 2014-12-08 (×6): qty 1

## 2014-12-08 MED ORDER — MENTHOL 3 MG MT LOZG: 1.0000 | LOZENGE | OROMUCOSAL | Status: DC | PRN

## 2014-12-08 MED ORDER — DEXAMETHASONE SODIUM PHOSPHATE 10 MG/ML IJ SOLN
10.0000 mg | Freq: Once | INTRAMUSCULAR | Status: AC
  Administered 2014-12-09: 10 mg via INTRAVENOUS
  Filled 2014-12-08: qty 1

## 2014-12-08 MED ORDER — ACETAMINOPHEN 325 MG PO TABS
650.0000 mg | ORAL_TABLET | Freq: Four times a day (QID) | ORAL | Status: DC | PRN
  Administered 2014-12-11: 650 mg via ORAL
  Filled 2014-12-08: qty 2

## 2014-12-08 MED ORDER — BUPIVACAINE HCL (PF) 0.25 % IJ SOLN
INTRAMUSCULAR | Status: AC
  Filled 2014-12-08: qty 30

## 2014-12-08 MED ORDER — CEFAZOLIN SODIUM-DEXTROSE 2-3 GM-% IV SOLR
INTRAVENOUS | Status: AC
  Filled 2014-12-08: qty 50

## 2014-12-08 MED ORDER — BUPIVACAINE LIPOSOME 1.3 % IJ SUSP
INTRAMUSCULAR | Status: DC | PRN
  Administered 2014-12-08: 20 mL

## 2014-12-08 MED ORDER — SODIUM CHLORIDE 0.9 % IJ SOLN
INTRAMUSCULAR | Status: DC | PRN
  Administered 2014-12-08: 30 mL

## 2014-12-08 MED ORDER — MORPHINE SULFATE 2 MG/ML IJ SOLN: 1.0000 mg | INTRAMUSCULAR | Status: DC | PRN

## 2014-12-08 MED ORDER — HYDROCHLOROTHIAZIDE 12.5 MG PO CAPS
12.5000 mg | ORAL_CAPSULE | Freq: Every day | ORAL | Status: DC
  Administered 2014-12-10 – 2014-12-11 (×2): 12.5 mg via ORAL
  Filled 2014-12-08 (×3): qty 1

## 2014-12-08 MED ORDER — 0.9 % SODIUM CHLORIDE (POUR BTL) OPTIME
TOPICAL | Status: DC | PRN
  Administered 2014-12-08: 1000 mL

## 2014-12-08 MED ORDER — ACETAMINOPHEN 500 MG PO TABS
1000.0000 mg | ORAL_TABLET | Freq: Four times a day (QID) | ORAL | Status: AC
  Administered 2014-12-08 – 2014-12-09 (×4): 1000 mg via ORAL
  Filled 2014-12-08 (×4): qty 2

## 2014-12-08 MED ORDER — PROPOFOL 10 MG/ML IV BOLUS
INTRAVENOUS | Status: DC | PRN
  Administered 2014-12-08: 30 mg via INTRAVENOUS

## 2014-12-08 SURGICAL SUPPLY — 64 items
BAG DECANTER FOR FLEXI CONT (MISCELLANEOUS) ×3 IMPLANT
BAG SPEC THK2 15X12 ZIP CLS (MISCELLANEOUS) ×1
BAG ZIPLOCK 12X15 (MISCELLANEOUS) ×3 IMPLANT
BANDAGE ELASTIC 6 VELCRO ST LF (GAUZE/BANDAGES/DRESSINGS) ×3 IMPLANT
BANDAGE ESMARK 6X9 LF (GAUZE/BANDAGES/DRESSINGS) ×1 IMPLANT
BLADE SAG 18X100X1.27 (BLADE) ×3 IMPLANT
BLADE SAW SGTL 11.0X1.19X90.0M (BLADE) ×3 IMPLANT
BNDG CMPR 9X6 STRL LF SNTH (GAUZE/BANDAGES/DRESSINGS) ×1
BNDG ESMARK 6X9 LF (GAUZE/BANDAGES/DRESSINGS) ×3
BOWL SMART MIX CTS (DISPOSABLE) ×3 IMPLANT
CAP KNEE TOTAL 3 SIGMA ×2 IMPLANT
CEMENT HV SMART SET (Cement) ×6 IMPLANT
CLOSURE WOUND 1/2 X4 (GAUZE/BANDAGES/DRESSINGS) ×1
CUFF TOURN SGL QUICK 34 (TOURNIQUET CUFF) ×3
CUFF TRNQT CYL 34X4X40X1 (TOURNIQUET CUFF) ×1 IMPLANT
DECANTER SPIKE VIAL GLASS SM (MISCELLANEOUS) ×3 IMPLANT
DRAPE EXTREMITY T 121X128X90 (DRAPE) ×3 IMPLANT
DRAPE POUCH INSTRU U-SHP 10X18 (DRAPES) ×3 IMPLANT
DRAPE U-SHAPE 47X51 STRL (DRAPES) ×3 IMPLANT
DRSG ADAPTIC 3X8 NADH LF (GAUZE/BANDAGES/DRESSINGS) ×3 IMPLANT
DRSG PAD ABDOMINAL 8X10 ST (GAUZE/BANDAGES/DRESSINGS) ×3 IMPLANT
DURAPREP 26ML APPLICATOR (WOUND CARE) ×3 IMPLANT
ELECT REM PT RETURN 9FT ADLT (ELECTROSURGICAL) ×3
ELECTRODE REM PT RTRN 9FT ADLT (ELECTROSURGICAL) ×1 IMPLANT
EVACUATOR 1/8 PVC DRAIN (DRAIN) ×3 IMPLANT
FACESHIELD WRAPAROUND (MASK) ×15 IMPLANT
FACESHIELD WRAPAROUND OR TEAM (MASK) ×5 IMPLANT
GAUZE SPONGE 4X4 12PLY STRL (GAUZE/BANDAGES/DRESSINGS) ×3 IMPLANT
GLOVE BIO SURGEON STRL SZ7.5 (GLOVE) IMPLANT
GLOVE BIO SURGEON STRL SZ8 (GLOVE) ×3 IMPLANT
GLOVE BIOGEL PI IND STRL 6.5 (GLOVE) IMPLANT
GLOVE BIOGEL PI IND STRL 8 (GLOVE) ×1 IMPLANT
GLOVE BIOGEL PI INDICATOR 6.5 (GLOVE)
GLOVE BIOGEL PI INDICATOR 8 (GLOVE) ×2
GLOVE SURG SS PI 6.5 STRL IVOR (GLOVE) IMPLANT
GOWN STRL REUS W/TWL LRG LVL3 (GOWN DISPOSABLE) ×3 IMPLANT
GOWN STRL REUS W/TWL XL LVL3 (GOWN DISPOSABLE) IMPLANT
HANDPIECE INTERPULSE COAX TIP (DISPOSABLE) ×3
IMMOBILIZER KNEE 20 (SOFTGOODS) ×3
IMMOBILIZER KNEE 20 THIGH 36 (SOFTGOODS) ×1 IMPLANT
KIT BASIN OR (CUSTOM PROCEDURE TRAY) ×3 IMPLANT
MANIFOLD NEPTUNE II (INSTRUMENTS) ×3 IMPLANT
NDL SAFETY ECLIPSE 18X1.5 (NEEDLE) ×2 IMPLANT
NEEDLE HYPO 18GX1.5 SHARP (NEEDLE) ×6
NS IRRIG 1000ML POUR BTL (IV SOLUTION) ×3 IMPLANT
PACK TOTAL JOINT (CUSTOM PROCEDURE TRAY) ×3 IMPLANT
PADDING CAST COTTON 6X4 STRL (CAST SUPPLIES) ×5 IMPLANT
PEN SKIN MARKING BROAD (MISCELLANEOUS) ×3 IMPLANT
POSITIONER SURGICAL ARM (MISCELLANEOUS) ×3 IMPLANT
SET HNDPC FAN SPRY TIP SCT (DISPOSABLE) ×1 IMPLANT
STRIP CLOSURE SKIN 1/2X4 (GAUZE/BANDAGES/DRESSINGS) ×3 IMPLANT
SUCTION FRAZIER 12FR DISP (SUCTIONS) ×3 IMPLANT
SUT MNCRL AB 4-0 PS2 18 (SUTURE) ×3 IMPLANT
SUT VIC AB 2-0 CT1 27 (SUTURE) ×9
SUT VIC AB 2-0 CT1 TAPERPNT 27 (SUTURE) ×3 IMPLANT
SUT VLOC 180 0 24IN GS25 (SUTURE) ×3 IMPLANT
SYR 20CC LL (SYRINGE) ×3 IMPLANT
SYR 50ML LL SCALE MARK (SYRINGE) ×3 IMPLANT
TOWEL OR 17X26 10 PK STRL BLUE (TOWEL DISPOSABLE) ×3 IMPLANT
TOWEL OR NON WOVEN STRL DISP B (DISPOSABLE) ×2 IMPLANT
TRAY FOLEY CATH 14FRSI W/METER (CATHETERS) ×3 IMPLANT
WATER STERILE IRR 1500ML POUR (IV SOLUTION) ×3 IMPLANT
WRAP KNEE MAXI GEL POST OP (GAUZE/BANDAGES/DRESSINGS) ×3 IMPLANT
YANKAUER SUCT BULB TIP 10FT TU (MISCELLANEOUS) ×3 IMPLANT

## 2014-12-08 NOTE — Anesthesia Procedure Notes (Signed)
Spinal  End time: 12/08/2014 1:49 PM Staffing Resident/CRNA: Enrigue Catena E Preanesthetic Checklist Completed: patient identified, site marked, surgical consent, pre-op evaluation, timeout performed, IV checked, risks and benefits discussed and monitors and equipment checked Spinal Block Patient position: sitting Prep: Betadine Patient monitoring: heart rate, continuous pulse ox and blood pressure Approach: left paramedian Location: L3-4 Injection technique: single-shot Needle Needle type: Spinocan  Needle gauge: 22 G Assessment Sensory level: T4 Additional Notes Pt tolerated   Procedure well. Spinal kit and drugs within date. CFx3,negative heme  Or parestheia.

## 2014-12-08 NOTE — H&P (View-Only) (Signed)
Carla [name]. Carla [name]: July 10, 1926 Married / Language: English / Race: White Female Date of Admission:  12/08/2014 CC:  Left Knee Pain History of Present Illness (Carla Mathias L. Jacqualynn Parco III PA-C; 11/23/2014 4:34 PM) The patient is a 81 year old female who comes in for a preoperative History and Physical. The patient is scheduled for a left total knee arthroplasty to be performed by Dr. [name]. [name], MD at [facility] on 12-08-2014. The patient comes in several months out from right total knee arthroplasty. The patient states that she is doing well at this time. The pain is controlled at this time and describe their pain as mild (laterally). They are currently on no medication for their pain. She is doing great in regard to the right knee. She is real pleased with that. The left knee is giving her a tremendous amount of trouble. She is having a hard time getting around because of the left knee. She is ready to go ahead and get that knee fixed. She has documented end-stage arthritis in that knee. They have been treated conservatively in the past for the above stated problem and despite conservative measures, they continue to have progressive pain and severe functional limitations and dysfunction. They have failed non-operative management including home exercise, medications, and injections. It is felt that they would benefit from undergoing total joint replacement. Risks and benefits of the procedure have been discussed with the patient and they elect to proceed with surgery. There are no active contraindications to surgery such as ongoing infection or rapidly progressive neurological disease.  Problem List/Past Medical (Carla [name], III PA-C; 11/18/2014 3:50 PM) Right knee pain (M25.561) Left shoulder pain (M25.512) Primary osteoarthritis of both knees (M17.0) Status post total right knee replacement (Carla [name]) Chronic Pain Asthma Bronchitis Gastroesophageal Reflux  Disease Ulcer disease Gastritis Barrett's Esophagus Osteoarthritis High blood pressure Anxiety Disorder Anemia Osteoporosis Skin Cancer Squamous Cell on Nose Impaired Hearing Bilateral Hearing Aids Shingles  Allergies No Know Drug Allergies  Intolerance Aspirin *ANALGESICS - NonNarcotic* she is sensitive to aspirin products due to GI effects Dolobid *ANALGESICS - NonNarcotic* she is sensitive to this medication due to GI effects  Family History (Carla [name], III PA-C; 11/18/2014 3:44 PM) Hypertension Father, Mother, Sister. Heart Disease Father, Mother. Osteoarthritis Brother, Father, Mother, Sister. child Osteoporosis Mother. Cancer First Degree Relatives. child First Degree Relatives reported Cerebrovascular Accident Sister.  Social History (Carla [name], III PA-C; 11/18/2014 3:44 PM) Not under pain contract No history of drug/alcohol rehab Exercise Exercises never Children 2 Living situation live alone Former drinker 09/03/2013: In the past drank Tobacco use Never smoker. 09/03/2013 Number of flights of stairs before winded less than 1 Tobacco / smoke exposure 09/03/2013: no Current work status retired Marital status widowed  Medication History (Carla [name], III PA-C; 11/18/2014 3:47 PM) Oxycodone (prn) Active. Robaxin (prn) Active. Glucosamine Complex (Oral) Active. Omeprazole (40MG  Capsule DR, Oral) Active. Losartan Potassium-HCTZ (50-12.5MG  Tablet, Oral) Active. Estradiol (0.1MG /24HR Patch Weekly, Transdermal) Active. Diazepam (5MG  Tablet, Oral) Active. Fluticasone Propionate (50MCG/ACT Suspension, Nasal) Active. Ventolin HFA (108 (90 Base)MCG/ACT Aerosol Soln, Inhalation) Active. (prn) ZyrTEC (10MG  Tablet, Oral) Active. Bilberry (1000MG  Capsule, Oral) Active. Biotin Maximum Strength (Oral) Specific dose unknown - Active. (10,000 with 100mg  Keratin) Aspirin Low Strength (81MG  Tablet  Chewable, Oral) Active.  Past Surgical History (Carla [name] [name], III PA-C; 11/18/2014 3:44 PM) Hysterectomy complete (non-cancerous) Cataract Surgery bilateral Rotator Cuff Repair right Arthroscopy of Knee right   Review of Systems (Carla Moga L. Carla [name]  III PA-C; 11/18/2014 3:51 PM) General Present- Fatigue and Night Sweats. Not Present- Chills, Fever, Memory Loss, Weight Gain and Weight Loss. Skin Not Present- Eczema, Hives, Itching, Lesions and Rash. HEENT Not Present- Dentures, Double Vision, Headache, Hearing Loss, Tinnitus and Visual Loss. Respiratory Present- Cough and Wheezing. Not Present- Allergies, Chronic Cough, Coughing up blood, Shortness of breath at rest and Shortness of breath with exertion. Cardiovascular Not Present- Chest Pain, Difficulty Breathing Lying Down, Murmur, Palpitations, Racing/skipping heartbeats and Swelling. Gastrointestinal Not Present- Abdominal Pain, Bloody Stool, Constipation, Diarrhea, Difficulty Swallowing, Heartburn, Jaundice, Loss of appetitie, Nausea and Vomiting. Female Genitourinary Not Present- Blood in Urine, Discharge, Flank Pain, Incontinence, Painful Urination, Urgency, Urinary frequency, Urinary Retention, Urinating at Night and Weak urinary stream. Musculoskeletal Present- Back Pain and Joint Pain. Not Present- Joint Swelling, Morning Stiffness, Muscle Pain, Muscle Weakness and Spasms. Neurological Not Present- Blackout spells, Difficulty with balance, Dizziness, Paralysis, Tremor and Weakness. Psychiatric Not Present- Insomnia  Vitals (Carla Baiz L. Myka Hitz III PA-C; 11/18/2014 3:57 PM) 11/18/2014 3:55 PM Weight: 126 lb Height: 61in Weight was reported by patient. Height was reported by patient. Body Surface Area: 1.55 m Body Mass Index: 23.81 kg/m  BP: 130/72 (Sitting, Right Arm, Standard)  Physical Exam (Carla Quin L. Asmi Fugere III PA-C; 11/18/2014 3:59 PM) General Mental Status -Alert, cooperative and good  historian. General Appearance-pleasant, Not in acute distress. Orientation-Oriented X3. Build & Nutrition-Well nourished and Well developed.  Head and Neck Head-normocephalic, atraumatic . Neck Global Assessment - supple, no bruit auscultated on the right, no bruit auscultated on the left. Note: bilateral hearing aids   Eye Vision-Wears corrective lenses. Pupil - Bilateral-Regular and Round. Motion - Bilateral-EOMI.  Chest and Lung Exam Auscultation Breath sounds - clear at anterior chest wall and clear at posterior chest wall. Adventitious sounds - No Adventitious sounds.  Cardiovascular Auscultation Rhythm - Regular rate and rhythm. Heart Sounds - S1 WNL and S2 WNL. Murmurs & Other Heart Sounds: Murmur 1 - Location - Aortic Area and Pulmonic Area. Timing - Holosystolic. Grade - [name]/VI. Character - Low pitched.  Abdomen Palpation/Percussion Tenderness - Abdomen is non-tender to palpation. Rigidity (guarding) - Abdomen is soft. Auscultation Auscultation of the abdomen reveals - Bowel sounds normal.  Female Genitourinary Note: Not done, not pertinent to present illness   Musculoskeletal Note: On examination, she is alert and oriented in no apparent distress. The right knee shows no swelling. Range of motion is zero to 128. There is no tenderness or instability. The left knee has no efusion. There is marked crepitus on range of motion with slight valgus. Range of 5 to 120 with no instability.   Assessment & Plan (Domenique Quest L. Zoeya Gramajo III PA-C; 11/18/2014 4:00 PM) Primary osteoarthritis of left knee (M17.12) Note:Surgical Plans: Left Total Knee Repalcement  Disposition: [facility]  PCP: Dr. Allie Bossier - Patient has been seen preoperatively and felt to be stable for surgery. She had an EKG done by Dr. Allie Bossier and showed no ischemia.  IV TXA  Anesthesia Issues: NONE  Signed electronically by Joelene Millin, III PA-C

## 2014-12-08 NOTE — Anesthesia Postprocedure Evaluation (Signed)
  Anesthesia Post-op Note  Patient: Carla [name]  Procedure(s) Performed: Procedure(s) (LRB): LEFT TOTAL KNEE ARTHROPLASTY (Left)  Patient Location: PACU  Anesthesia Type: Spinal  Level of Consciousness: awake and alert   Airway and Oxygen Therapy: Patient Spontanous Breathing  Post-op Pain: mild  Post-op Assessment: Post-op Vital signs reviewed, Patient's Cardiovascular Status Stable, Respiratory Function Stable, Patent Airway and No signs of Nausea or vomiting  Last Vitals:  Filed Vitals:   12/08/14 1756  BP: 153/77  Pulse: 68  Temp: 36.4 C  Resp: 16    Post-op Vital Signs: stable   Complications: No apparent anesthesia complications

## 2014-12-08 NOTE — Progress Notes (Signed)
Utilization review completed.  

## 2014-12-08 NOTE — Interval H&P Note (Signed)
History and Physical Interval Note:  12/08/2014 1:35 PM  Carla [name]  has presented today for surgery, with the diagnosis of OSTEOARTHRITIS LEFT KNEE   The various methods of treatment have been discussed with the patient and family. After consideration of risks, benefits and other options for treatment, the patient has consented to  Procedure(s): LEFT TOTAL KNEE ARTHROPLASTY (Left) as a surgical intervention .  The patient's history has been reviewed, patient examined, no change in status, stable for surgery.  I have reviewed the patient's chart and labs.  Questions were answered to the patient's satisfaction.     Gearlean Alf

## 2014-12-08 NOTE — Transfer of Care (Signed)
Immediate Anesthesia Transfer of Care Note  Patient: Carla [name]  Procedure(s) Performed: Procedure(s): LEFT TOTAL KNEE ARTHROPLASTY (Left)  Patient Location: PACU  Anesthesia Type:Spinal  Level of Consciousness: awake, oriented and patient cooperative  Airway & Oxygen Therapy: Patient Spontanous Breathing and Patient connected to face mask oxygen  Post-op Assessment: Report given to RN and Post -op Vital signs reviewed and stable  Post vital signs: stable  Last Vitals:  Filed Vitals:   12/08/14 1040  BP: 173/78  Pulse: 79  Temp: 36.4 C  Resp: 16    Complications: No apparent anesthesia complications  Spinal level T 7

## 2014-12-08 NOTE — Anesthesia Preprocedure Evaluation (Signed)
Anesthesia Evaluation  Patient identified by MRN, date of birth, ID band Patient awake    Reviewed: Allergy & Precautions, H&P , NPO status , Patient's Chart, lab work & pertinent test results  Airway Mallampati: II  TM Distance: >3 FB Neck ROM: Full    Dental no notable dental hx.    Pulmonary asthma ,  breath sounds clear to auscultation  Pulmonary exam normal       Cardiovascular hypertension, Pt. on medications Rhythm:Regular Rate:Normal     Neuro/Psych negative neurological ROS  negative psych ROS   GI/Hepatic Neg liver ROS, GERD-  Medicated,  Endo/Other  negative endocrine ROS  Renal/GU negative Renal ROS  negative genitourinary   Musculoskeletal negative musculoskeletal ROS (+)   Abdominal   Peds negative pediatric ROS (+)  Hematology negative hematology ROS (+)   Anesthesia Other Findings   Reproductive/Obstetrics negative OB ROS                             Anesthesia Physical  Anesthesia Plan  ASA: II  Anesthesia Plan: Spinal   Post-op Pain Management:    Induction: Intravenous  Airway Management Planned:   Additional Equipment:   Intra-op Plan:   Post-operative Plan:   Informed Consent: I have reviewed the patients History and Physical, chart, labs and discussed the procedure including the risks, benefits and alternatives for the proposed anesthesia with the patient or authorized representative who has indicated his/her understanding and acceptance.   Dental advisory given  Plan Discussed with: CRNA  Anesthesia Plan Comments: (Discussed risks/benefits of spinal including headache, backache, failure, bleeding, infection, and nerve damage. Patient consents to spinal. Questions answered. Coagulation studies and platelet count acceptable.  She has a history of spinal stenosis and has had some spinal injections by Dr. Nelva Bush. Spinal stenosis may make placement of a  spinal anesthetic more difficult.)        Anesthesia Quick Evaluation

## 2014-12-08 NOTE — Op Note (Signed)
Pre-operative diagnosis- Osteoarthritis Left knee(s)  Post-operative diagnosis- Osteoarthritis  Left knee(s)  Procedure-   Left Total Knee Arthroplasty  Surgeon- [name]. Oaklyn Jakubek, MD  Assistant- [name], PA-C   Anesthesia-  Spinal   EBL- * No blood loss amount entered *   Drains Hemovac   Tourniquet time  Total Tourniquet Time Documented: Thigh (Left) - 28 minutes Total: Thigh (Left) - 28 minutes    Complications- None  Condition-PACU - hemodynamically stable.   Brief Clinical Note  Carla [name] is a 81 y.o. year old female with end stage OA of her left knee with progressively worsening pain and dysfunction. She has constant pain, with activity and at rest and significant functional deficits with difficulties even with ADLs. She has had extensive non-op management including analgesics, injections of cortisone and viscosupplements, and home exercise program, but remains in significant pain with significant dysfunction. Radiographs show bone on bone arthritis lateral and patellofemoral. She presents now for left Total Knee Arthroplasty.    Procedure in detail---       The patient is brought into the operating room and positioned supine on the operating table. After successful administration of Spinal anesthetic, a tourniquet is placed high on the Left thigh(s) and the lower extremity is prepped and draped in the usual sterile fashion. Time out is performed by the operating team and then the Left  lower extremity is wrapped in Esmarch, knee flexed and the tourniquet inflated to 300 mmHg.       A midline incision is made with a ten blade through the subcutaneous tissue to the level of the extensor mechanism. A fresh blade is used to make a lateral parapatellar arthrotomy due to the patients' valgus deformity. Soft tissue over the proximal lateral tibia is subperiosteally elevated to the joint line with a knife to the posterolateral corner but not including the structures of the  posterolateral corner. Soft tissue over the proximal medial tibia is elevated with attention being paid to avoiding the patellar tendon on the tibial tubercle. The patella is everted medially, knee flexed 90 degrees and the ACL and PCL are removed. Findings are bone on bone lateral and patellofemoral .       The drill is used to create a starting hole in the distal femur and the canal is thoroughly irrigated with sterile saline to remove the fatty contents. The 5 degree Left  valgus alignment guide is placed into the femoral canal and the distal femoral cutting block is pinned to remove 10  mm off the distal femur. Resection is made with an oscillating saw.      The tibia is subluxed forward and the menisci are removed. The extramedullary alignment guide is placed referencing proximally at the medial aspect of the tibial tubercle and distally along the second metatarsal axis and tibial crest. The block is pinned to remove 10mm off the more deficient lateral side. Resection is made with an oscillating saw. Size 2  is the most appropriate size for the tibia and the proximal tibia is prepared with the modular drill and keel punch for that size.      The femoral sizing guide is placed and size 2.5  is most appropriate. Rotation is marked off the epicondylar axis and confirmed by creating a rectangular flexion gap at 90 degrees. The size 2.5  cutting block is pinned in this rotation and the anterior, posterior and chamfer cuts are made with the oscillating saw. The intercondylar block is then placed and  that cut is made.      Trial size 2  tibial component, trial size 2.5  posterior stabilized femur and a 12.5  mm posterior stabilized rotating platform insert trial is placed. Full extension is achieved with excellent varus/valgus and   anterior/posterior balance throughout full range of motion. The patella is everted and thickness measured to be 22  mm. Free hand resection is taken to 12 mm, a 35 template is placed,  lug holes are drilled, trial patella is placed, and it tracks normally. Osteophytes are removed off the posterior femur with the trial in place. All trials are removed and the cut bone surfaces prepared with pulsatile lavage. Cement is mixed and once ready for implantation, the size 2  tibial implant, size 2.5 posterior stabilized femoral component, and the size 35  patella are cemented in place and the patella is held with the clamp. The trial insert is placed and the knee held in full extension. The Exparel (20 ml mixed with 30 ml saline) and then 20 ml of .25% Bupivicaine is injected into the extensor mechanism, posterior capsule, medial and lateral gutters and subcutaneous tissues. All extruded cement is removed and once the cement is hard the permanent 12.5  mm posterior stabilized rotating platform insert is placed into the tibial tray.      The wound is copiously irrigated with saline solution and the tourniquet is released for a total   tourniquet time of 28  minutes. Bleeding is identified and controlled with electrocautery. The extensor mechanism is closed with interrupted #1 V-loc leaving open a small area from the superior to inferior pole of the patella to serve as a mini lateral release. Flexion against gravity is 140  degrees and the patella tracks normally. Subcutaneous tissue is closed with 2.0 vicryl and subcuticular with running 4.0 Monocryl.The incision is cleaned and dried and steri-strips and a bulky sterile dressing are applied. The limb is placed into a knee immobilizer and the patient is awakened and transported to recovery in stable condition.      Please note that a surgical assistant was a medical necessity for this procedure in order to perform it in a safe and expeditious manner. Surgical assistant was necessary to retract the ligaments and vital neurovascular structures to prevent injury to them and also necessary for proper positioning of the limb to allow for anatomic placement of  the prosthesis.    [name] Zuria Fosdick, MD    12/08/2014, 2:48 PM

## 2014-12-09 ENCOUNTER — Encounter (HOSPITAL_COMMUNITY): Payer: Self-pay | Admitting: Orthopedic Surgery

## 2014-12-09 LAB — CBC
HCT: 27.5 % — ABNORMAL LOW (ref 36.0–46.0)
HEMOGLOBIN: 8.7 g/dL — AB (ref 12.0–15.0)
MCH: 31.2 pg (ref 26.0–34.0)
MCHC: 31.6 g/dL (ref 30.0–36.0)
MCV: 98.6 fL (ref 78.0–100.0)
Platelets: 382 10*3/uL (ref 150–400)
RBC: 2.79 MIL/uL — ABNORMAL LOW (ref 3.87–5.11)
RDW: 14.3 % (ref 11.5–15.5)
WBC: 16.6 10*3/uL — ABNORMAL HIGH (ref 4.0–10.5)

## 2014-12-09 LAB — BASIC METABOLIC PANEL
ANION GAP: 6 (ref 5–15)
BUN: 12 mg/dL (ref 6–23)
CO2: 26 mmol/L (ref 19–32)
Calcium: 8.1 mg/dL — ABNORMAL LOW (ref 8.4–10.5)
Chloride: 105 mmol/L (ref 96–112)
Creatinine, Ser: 0.49 mg/dL — ABNORMAL LOW (ref 0.50–1.10)
GFR, EST NON AFRICAN AMERICAN: 86 mL/min — AB (ref >90)
GLUCOSE: 135 mg/dL — AB (ref 70–99)
POTASSIUM: 4.6 mmol/L (ref 3.5–5.1)
SODIUM: 137 mmol/L (ref 135–145)

## 2014-12-09 MED ORDER — NON FORMULARY: 40.0000 mg | Freq: Every morning | Status: DC

## 2014-12-09 MED ORDER — OMEPRAZOLE 20 MG PO CPDR
40.0000 mg | DELAYED_RELEASE_CAPSULE | Freq: Every day | ORAL | Status: DC
  Administered 2014-12-09 – 2014-12-11 (×3): 40 mg via ORAL
  Filled 2014-12-09 (×5): qty 2
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
## 2014-12-09 NOTE — Discharge Instructions (Addendum)
° °Dr. Frank [name] °Total Joint Specialist °[facility] °[address] °[place], [place] [address] °[phone] ° °TOTAL KNEE REPLACEMENT POSTOPERATIVE DIRECTIONS ° ° ° °Knee Rehabilitation, Guidelines Following Surgery  °Results after knee surgery are often greatly improved when you follow the exercise, range of motion and muscle strengthening exercises prescribed by your doctor. Safety measures are also important to protect the knee from further injury. Any time any of these exercises cause you to have increased pain or swelling in your knee joint, decrease the amount until you are comfortable again and slowly increase them. If you have problems or questions, call your caregiver or physical therapist for advice.  ° °HOME CARE INSTRUCTIONS  °Remove items at home which could result in a fall. This includes throw rugs or furniture in walking pathways.  °Continue medications as instructed at time of discharge. °You may have some home medications which will be placed on hold until you complete the course of blood thinner medication.  °You may start showering once you are discharged home but do not submerge the incision under water. Just pat the incision dry and apply a dry gauze dressing on daily. °Walk with walker as instructed.  °You may resume a sexual relationship in one month or when given the OK by  your doctor.  °· Use walker as long as suggested by your caregivers. °· Avoid periods of inactivity such as sitting longer than an hour when not asleep. This helps prevent blood clots.  °You may put full weight on your legs and walk as much as is comfortable.  °You may return to work once you are cleared by your doctor.  °Do not drive a car for 6 weeks or until released by you surgeon.  °· Do not drive while taking narcotics.  °Wear the elastic stockings for three weeks following surgery during the day but you may remove then at night. °Make sure you keep all of your appointments after your  operation with all of your doctors and caregivers. You should call the office at the above phone number and make an appointment for approximately two weeks after the date of your surgery. °Change the dressing daily and reapply a dry dressing each time. °Please pick up a stool softener and laxative for home use as long as you are requiring pain medications. °· ICE to the affected knee every three hours for 30 minutes at a time and then as needed for pain and swelling.  Continue to use ice on the knee for pain and swelling from surgery. You may notice swelling that will progress down to the foot and ankle.  This is normal after surgery.  Elevate the leg when you are not up walking on it.   °It is important for you to complete the blood thinner medication as prescribed by your doctor. °· Continue to use the breathing machine which will help keep your temperature down.  It is common for your temperature to cycle up and down following surgery, especially at night when you are not up moving around and exerting yourself.  The breathing machine keeps your lungs expanded and your temperature down. ° °RANGE OF MOTION AND STRENGTHENING EXERCISES  °Rehabilitation of the knee is important following a knee injury or an operation. After just a few days of immobilization, the muscles of the thigh which control the knee become weakened and shrink (atrophy). Knee exercises are designed to build up the tone and strength of the thigh muscles and to improve knee   motion. Often times heat used for twenty to thirty minutes before working out will loosen up your tissues and help with improving the range of motion but do not use heat for the first two weeks following surgery. These exercises can be done on a training (exercise) mat, on the floor, on a table or on a bed. Use what ever works the best and is most comfortable for you Knee exercises include:  °Leg Lifts - While your knee is still immobilized in a splint or cast, you can do  straight leg raises. Lift the leg to 60 degrees, hold for 3 sec, and slowly lower the leg. Repeat 10-20 times 2-3 times daily. Perform this exercise against resistance later as your knee gets better.  °Quad and Hamstring Sets - Tighten up the muscle on the front of the thigh (Quad) and hold for 5-10 sec. Repeat this 10-20 times hourly. Hamstring sets are done by pushing the foot backward against an object and holding for 5-10 sec. Repeat as with quad sets.  °A rehabilitation program following serious knee injuries can speed recovery and prevent re-injury in the future due to weakened muscles. Contact your doctor or a physical therapist for more information on knee rehabilitation.  ° °SKILLED REHAB INSTRUCTIONS: °If the patient is transferred to a skilled rehab facility following release from the hospital, a list of the current medications will be sent to the facility for the patient to continue.  When discharged from the skilled rehab facility, please have the facility set up the patient's Home Health Physical Therapy prior to being released. Also, the skilled facility will be responsible for providing the patient with their medications at time of release from the facility to include their pain medication, the muscle relaxants, and their blood thinner medication. If the patient is still at the rehab facility at time of the two week follow up appointment, the skilled rehab facility will also need to assist the patient in arranging follow up appointment in our office and any transportation needs. ° °MAKE SURE YOU:  °Understand these instructions.  °Will watch your condition.  °Will get help right away if you are not doing well or get worse.  ° ° °Pick up stool softner and laxative for home use following surgery while on pain medications. °Do not submerge incision under water. °Please use good hand washing techniques while changing dressing each day. °May shower starting three days after surgery. °Please use a clean  towel to pat the incision dry following showers. °Continue to use ice for pain and swelling after surgery. °Do not use any lotions or creams on the incision until instructed by your surgeon. ° °Take Xarelto for two and a half more weeks, then discontinue Xarelto. °Once the patient has completed the blood thinner regimen, then take a Baby 81 mg Aspirin daily for three more weeks. ° °Postoperative Constipation Protocol ° °Constipation - defined medically as fewer than three stools per week and severe constipation as less than one stool per week. ° °One of the most common issues patients have following surgery is constipation.  Even if you have a regular bowel pattern at home, your normal regimen is likely to be disrupted due to multiple reasons following surgery.  Combination of anesthesia, postoperative narcotics, change in appetite and fluid intake all can affect your bowels.  In order to avoid complications following surgery, here are some recommendations in order to help you during your recovery period. ° °Colace (docusate) - Pick up an over-the-counter form of   Colace or another stool softener and take twice a day as long as you are requiring postoperative pain medications.  Take with a full glass of water daily.  If you experience loose stools or diarrhea, hold the colace until you stool forms back up.  If your symptoms do not get better within 1 week or if they get worse, check with your doctor.  Dulcolax (bisacodyl) - Pick up over-the-counter and take as directed by the product packaging as needed to assist with the movement of your bowels.  Take with a full glass of water.  Use this product as needed if not relieved by Colace only.   MiraLax (polyethylene glycol) - Pick up over-the-counter to have on hand.  MiraLax is a solution that will increase the amount of water in your bowels to assist with bowel movements.  Take as directed and can mix with a glass of water, juice, soda, coffee, or tea.  Take if you  go more than two days without a movement. Do not use MiraLax more than once per day. Call your doctor if you are still constipated or irregular after using this medication for 7 days in a row.  If you continue to have problems with postoperative constipation, please contact the office for further assistance and recommendations.  If you experience "the worst abdominal pain ever" or develop nausea or vomiting, please contact the office immediatly for further recommendations for treatment.  When discharged from the skilled rehab facility, please have the facility set up the patient's [place] prior to being released.   Also provide the patient with their medications at time of release from the facility to include their pain medication, the muscle relaxants, and their blood thinner medication.  If the patient is still at the rehab facility at time of follow up appointment, please also assist the patient in arranging follow up appointment in our office and any transportation needs. ICE to the affected knee or hip every three hours for 30 minutes at a time and then as needed for pain and swelling.   Information on my medicine - XARELTO (Rivaroxaban)  This medication education was reviewed with me or my healthcare representative as part of my discharge preparation.  The pharmacist that spoke with me during my hospital stay was:  Emiliano Dyer, RPH  Why was Xarelto prescribed for you? Xarelto was prescribed for you to reduce the risk of blood clots forming after orthopedic surgery. The medical term for these abnormal blood clots is venous thromboembolism (VTE).  What do you need to know about xarelto ? Take your Xarelto ONCE DAILY at the same time every day. You may take it either with or without food.  If you have difficulty swallowing the tablet whole, you may crush it and mix in applesauce just prior to taking your dose.  Take Xarelto exactly as prescribed by your  doctor and DO NOT stop taking Xarelto without talking to the doctor who prescribed the medication.  Stopping without other VTE prevention medication to take the place of Xarelto may increase your risk of developing a clot.  After discharge, you should have regular check-up appointments with your healthcare provider that is prescribing your Xarelto.    What do you do if you miss a dose? If you miss a dose, take it as soon as you remember on the same day then continue your regularly scheduled once daily regimen the next day. Do not take two doses of Xarelto on the same  day.   Important Safety Information A possible side effect of Xarelto is bleeding. You should call your healthcare provider right away if you experience any of the following: ? Bleeding from an injury or your nose that does not stop. ? Unusual colored urine (red or dark Baumert) or unusual colored stools (red or black). ? Unusual bruising for unknown reasons. ? A serious fall or if you hit your head (even if there is no bleeding).  Some medicines may interact with Xarelto and might increase your risk of bleeding while on Xarelto. To help avoid this, consult your healthcare provider or pharmacist prior to using any new prescription or non-prescription medications, including herbals, vitamins, non-steroidal anti-inflammatory drugs (NSAIDs) and supplements.  This website has more information on Xarelto: [email or web address]

## 2014-12-09 NOTE — Progress Notes (Signed)
Clinical Social Work Department BRIEF PSYCHOSOCIAL ASSESSMENT 12/09/2014  Patient:  Carla [name], Carla [name]     Account Number:  [id number]     Admit date:  12/08/2014  Clinical Social Worker:  Lacie Scotts  Date/Time:  12/09/2014 03:14 PM  Referred by:  Physician  Date Referred:  12/09/2014 Referred for  SNF Placement   Other Referral:   Interview type:  Patient Other interview type:    PSYCHOSOCIAL DATA Living Status:  ALONE Admitted from facility:   Level of care:   Primary support name:  Timmothy Euler Primary support relationship to patient:  CHILD, ADULT Degree of support available:   unclear    CURRENT CONCERNS Current Concerns  Post-Acute Placement   Other Concerns:    SOCIAL WORK ASSESSMENT / PLAN Pt is an 81 yr old female living at home prior to hospitalization. CSW met with pt to assist with d/c planning. This is a planned admission. Pt has made prior arrangements to have ST Rehab at [facility] following hospital d/c. CSW has contacted SNF and d/c plans confirmed. CSW will continue to follow to assist with d/c planning to SNF.   Assessment/plan status:  Psychosocial Support/Ongoing Assessment of Needs Other assessment/ plan:   Information/referral to community resources:   Insurance coverage for SNF and ambulance transport reviewed.    PATIENT'S/FAMILY'S RESPONSE TO PLAN OF CARE: Pt is happy surgery is over. " I had a good experience at [facility]." Pt is looking forward to returning and beginning rehab.    Werner Lean LCSW [phone]
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
## 2014-12-09 NOTE — Evaluation (Signed)
Physical Therapy Evaluation Patient Details Name: Carla [name] MRN: [id number] DOB: 06/29/1926 Today's Date: 12/09/2014   History of Present Illness  L TKA due to OA.  Had R TKA  ~ 6 months ago with positive results  Clinical Impression  Pt admitted with above diagnosis. Pt currently with functional limitations due to the deficits listed below (see PT Problem List).  Pt will benefit from skilled PT to increase their independence and safety with mobility to allow discharge to the venue listed below.  Pt is an excellent candidate for short term SNF rehab and should do well there with ultimate goal of d/c home alone.     Follow Up Recommendations SNF    Equipment Recommendations  None recommended by PT    Recommendations for Other Services       Precautions / Restrictions Precautions Precautions: Knee Required Braces or Orthoses: Knee Immobilizer - Left Restrictions Weight Bearing Restrictions: Yes LLE Weight Bearing: Weight bearing as tolerated      Mobility  Bed Mobility Overal bed mobility: Needs Assistance Bed Mobility: Supine to Sit     Supine to sit: Min assist;Min guard     General bed mobility comments: MIN A at very end to get hips fully turned  Transfers Overall transfer level: Needs assistance Equipment used: Rolling walker (2 wheeled) Transfers: Sit to/from Stand Sit to Stand: Min guard         General transfer comment: multi-modal cueing for hand placement with sit > stand  Ambulation/Gait Ambulation/Gait assistance: Min guard Ambulation Distance (Feet): 10 Feet Assistive device: Rolling walker (2 wheeled) Gait Pattern/deviations: Step-to pattern;Decreased stance time - left     General Gait Details: Pt declined ambulation in hall this session.  Ambulated in room with KI and c/o minimal soreness.  Stairs            Wheelchair Mobility    Modified Rankin (Stroke Patients Only)       Balance Overall balance assessment: Needs  assistance   Sitting balance-Leahy Scale: Good       Standing balance-Leahy Scale: Poor Standing balance comment: requires RW with standing                             Pertinent Vitals/Pain Pain Assessment: Faces Faces Pain Scale: Hurts a little bit Pain Location: soreness in knee with gait and c/o R shoulder discomfot Pain Descriptors / Indicators: Sore Pain Intervention(s): Ice applied;Monitored during session;Repositioned;Limited activity within patient's tolerance    Home Living Family/patient expects to be discharged to:: Skilled nursing facility Living Arrangements: Alone                    Prior Function Level of Independence: Independent               Hand Dominance        Extremity/Trunk Assessment   Upper Extremity Assessment: Defer to OT evaluation           Lower Extremity Assessment: LLE deficits/detail   LLE Deficits / Details: L knee AROM flex limited to ~ 35-40 degrees with fair quad set  Cervical / Trunk Assessment: Normal  Communication   Communication: No difficulties  Cognition Arousal/Alertness: Awake/alert Behavior During Therapy: WFL for tasks assessed/performed Overall Cognitive Status: Within Functional Limits for tasks assessed                      General Comments  Exercises Total Joint Exercises Ankle Circles/Pumps: AROM;Both;10 reps Quad Sets: Left;10 reps;Supine Heel Slides: AROM;Left;Supine;10 reps      Assessment/Plan    PT Assessment Patient needs continued PT services  PT Diagnosis Difficulty walking   PT Problem List Decreased strength;Decreased range of motion;Decreased balance;Decreased mobility  PT Treatment Interventions Functional mobility training;Therapeutic activities;Therapeutic exercise;Gait training;DME instruction   PT Goals (Current goals can be found in the Care Plan section) Acute Rehab PT Goals Patient Stated Goal: To go to rehab. PT Goal Formulation: With  patient Time For Goal Achievement: 12/16/14 Potential to Achieve Goals: Good    Frequency 7X/week   Barriers to discharge        Co-evaluation               End of Session Equipment Utilized During Treatment: Gait belt Activity Tolerance: Patient tolerated treatment well Patient left: in chair;with call bell/phone within reach Nurse Communication: Mobility status         Time: 3329-5188 PT Time Calculation (min) (ACUTE ONLY): 26 min   Charges:   PT Evaluation $Initial PT Evaluation Tier I: 1 Procedure PT Treatments $Gait Training: 8-22 mins   PT G Codes:        Squire Withey LUBECK 12/09/2014, 9:51 AM

## 2014-12-09 NOTE — Progress Notes (Signed)
Physical Therapy Treatment Patient Details Name: Carla [name] MRN: [id number] DOB: 28-Sep-1925 Today's Date: 12/09/2014    History of Present Illness L TKA due to OA.  Had R TKA  ~ 6 months ago with positive results    PT Comments    Pt able to increase gait distance in second session, but fatigued from being up all AM and requesting to get back to bed.  Spoke with NT about having patient get back up later in the day.  Con't to recommend short term SNF upon d/c.  Follow Up Recommendations  SNF     Equipment Recommendations  None recommended by PT    Recommendations for Other Services       Precautions / Restrictions Precautions Precautions: Knee Required Braces or Orthoses: Knee Immobilizer - Left Knee Immobilizer - Left: On at all times Restrictions Weight Bearing Restrictions: No LLE Weight Bearing: Weight bearing as tolerated    Mobility  Bed Mobility Overal bed mobility: Needs Assistance Bed Mobility: Sit to Supine      Sit to supine: Min assist   General bed mobility comments: A for L LE to get back on to bed  Transfers Overall transfer level: Needs assistance Equipment used: Rolling walker (2 wheeled) Transfers: Sit to/from Stand Sit to Stand: Min guard         General transfer comment: stood from recliner and toilet with increased difficulty from toilet.  Poorly controlled descent with stand to sit to bed.  Ambulation/Gait Ambulation/Gait assistance: Min guard Ambulation Distance (Feet): 40 Feet (plus 10 in room with toileting) Assistive device: Rolling walker (2 wheeled) Gait Pattern/deviations: Decreased step length - right;Decreased step length - left Gait velocity: decreased   General Gait Details: Pt ambulated well with RW with cues for sequence.   Stairs            Wheelchair Mobility    Modified Rankin (Stroke Patients Only)       Balance Overall balance assessment: Needs assistance   Sitting balance-Leahy Scale: Good        Standing balance-Leahy Scale: Poor Standing balance comment: requires RW with standing                    Cognition Arousal/Alertness: Awake/alert Behavior During Therapy: WFL for tasks assessed/performed Overall Cognitive Status: Within Functional Limits for tasks assessed                      Exercises Total Joint Exercises Ankle Circles/Pumps: AROM;Both;10 reps Quad Sets: Left;10 reps;Supine Heel Slides: AROM;Left;Supine;10 reps    General Comments       Pertinent Vitals/Pain Pain Assessment: Faces Pain Score: 3  Faces Pain Scale: Hurts a little bit Pain Location: L knee Pain Descriptors / Indicators: Sore Pain Intervention(s): Ice applied;Repositioned;Limited activity within patient's tolerance    Home Living Family/patient expects to be discharged to:: Skilled nursing facility Living Arrangements: Alone                  Prior Function Level of Independence: Independent          PT Goals (current goals can now be found in the care plan section) Acute Rehab PT Goals Patient Stated Goal: To go to rehab. PT Goal Formulation: With patient Time For Goal Achievement: 12/16/14 Potential to Achieve Goals: Good Progress towards PT goals: Progressing toward goals    Frequency  7X/week    PT Plan Current plan remains appropriate    Co-evaluation  End of Session Equipment Utilized During Treatment: Gait belt Activity Tolerance: Patient tolerated treatment well Patient left: in bed;with call bell/phone within reach     Time: 7741-2878 PT Time Calculation (min) (ACUTE ONLY): 20 min  Charges:  $Gait Training: 8-22 mins                    G Codes:      Carla [name] 12/09/2014, 12:25 PM

## 2014-12-09 NOTE — Progress Notes (Signed)
Clinical Social Work Department CLINICAL SOCIAL WORK PLACEMENT NOTE 12/09/2014  Patient:  SATIVA, GELLES  Account Number:  [id number] Admit date:  12/08/2014  Clinical Social Worker:  Werner Lean, LCSW  Date/time:  12/09/2014 03:22 PM  Clinical Social Work is seeking post-discharge placement for this patient at the following level of care:   SKILLED NURSING   (*CSW will update this form in Epic as items are completed)     Patient/family provided with [place] Department of Clinical Social Work's list of facilities offering this level of care within the geographic area requested by the patient (or if unable, by the patient's family).  12/09/2014  Patient/family informed of their freedom to choose among providers that offer the needed level of care, that participate in Medicare, Medicaid or managed care program needed by the patient, have an available bed and are willing to accept the patient.    Patient/family informed of [facility]' ownership interest in [facility], as well as of the fact that they are under no obligation to receive care at this facility.  PASARR submitted to EDS on 12/08/2014 PASARR number received on 12/08/2014  FL2 transmitted to all facilities in geographic area requested by pt/family on  12/09/2014 FL2 transmitted to all facilities within larger geographic area on   Patient informed that his/her managed care company has contracts with or will negotiate with  certain facilities, including the following:     Patient/family informed of bed offers received:  12/09/2014 Patient chooses bed at [place] recommends and patient chooses bed at    Patient to be transferred to [place]  on   Patient to be transferred to facility by  Patient and family notified of transfer on  Name of family member notified:    The following physician request were entered in Epic:   Additional Comments:  Werner Lean LCSW [phone]

## 2014-12-09 NOTE — Care Management Note (Signed)
    Page 1 of 1   12/09/2014     10:14:42 AM CARE MANAGEMENT NOTE 12/09/2014  Patient:  Carla [name], Carla [name]   Account Number:  [id number]  Date Initiated:  12/09/2014  Documentation initiated by:  [facility]  Subjective/Objective Assessment:   adm: LEFT TOTAL KNEE ARTHROPLASTY (Left     Action/Plan:   discharge planning   Anticipated DC Date:  12/10/2014   Anticipated DC Plan:  [place]  CM consult      Choice offered to / List presented to:             Status of service:  Completed, signed off Medicare Important Message given?   (If response is "NO", the following Medicare IM given date fields will be blank) Date Medicare IM given:   Medicare IM given by:   Date Additional Medicare IM given:   Additional Medicare IM given by:    Discharge Disposition:  [place]  Per UR Regulation:    If discussed at Long Length of Stay Meetings, dates discussed:    Comments:  12/09/14 08:15 Cm notes pt ot go to SNF; CSW arranging.  No other CM needs were communicated.  Mariane Masters, BSN, CM [phone].

## 2014-12-10 LAB — CBC
HCT: 23.8 % — ABNORMAL LOW (ref 36.0–46.0)
HEMOGLOBIN: 7.6 g/dL — AB (ref 12.0–15.0)
MCH: 32.1 pg (ref 26.0–34.0)
MCHC: 31.9 g/dL (ref 30.0–36.0)
MCV: 100.4 fL — AB (ref 78.0–100.0)
Platelets: 321 10*3/uL (ref 150–400)
RBC: 2.37 MIL/uL — AB (ref 3.87–5.11)
RDW: 15.3 % (ref 11.5–15.5)
WBC: 15.1 10*3/uL — ABNORMAL HIGH (ref 4.0–10.5)

## 2014-12-10 LAB — BASIC METABOLIC PANEL
ANION GAP: 8 (ref 5–15)
BUN: 17 mg/dL (ref 6–23)
CHLORIDE: 104 mmol/L (ref 96–112)
CO2: 29 mmol/L (ref 19–32)
Calcium: 8.6 mg/dL (ref 8.4–10.5)
Creatinine, Ser: 0.51 mg/dL (ref 0.50–1.10)
GFR calc non Af Amer: 85 mL/min — ABNORMAL LOW (ref >90)
Glucose, Bld: 124 mg/dL — ABNORMAL HIGH (ref 70–99)
POTASSIUM: 4.8 mmol/L (ref 3.5–5.1)
Sodium: 141 mmol/L (ref 135–145)

## 2014-12-10 MED ORDER — POLYSACCHARIDE IRON COMPLEX 150 MG PO CAPS
150.0000 mg | ORAL_CAPSULE | Freq: Two times a day (BID) | ORAL | Status: DC
  Administered 2014-12-10 – 2014-12-11 (×3): 150 mg via ORAL
  Filled 2014-12-10 (×4): qty 1

## 2014-12-10 NOTE — Progress Notes (Signed)
Physical Therapy Treatment Patient Details Name: Carla [name] MRN: [id number] DOB: 1925/11/25 Today's Date: 12/10/2014    History of Present Illness L TKA due to OA.  Had R TKA  ~ 6 months ago with positive results    PT Comments    Pt making good progress. Low Hgb but not symptomatic with PT.  Per MD notes, plan is to dc to short term SNF rehab probably tomorrow.  Follow Up Recommendations  SNF     Equipment Recommendations  None recommended by PT    Recommendations for Other Services       Precautions / Restrictions Precautions Precautions: Knee Required Braces or Orthoses: Knee Immobilizer - Left Restrictions Weight Bearing Restrictions: No LLE Weight Bearing: Weight bearing as tolerated    Mobility  Bed Mobility Overal bed mobility: Needs Assistance Bed Mobility: Sit to Supine       Sit to supine: Min assist   General bed mobility comments: MIN A for L LE  Transfers Overall transfer level: Needs assistance Equipment used: Rolling walker (2 wheeled) Transfers: Sit to/from Stand Sit to Stand: Supervision         General transfer comment: Cues to keep RW with her until ready to sit.  Ambulation/Gait Ambulation/Gait assistance: Min guard Ambulation Distance (Feet): 70 Feet Assistive device: Rolling walker (2 wheeled) Gait Pattern/deviations: Step-to pattern Gait velocity: decreased   General Gait Details: Cues initially to not pick up RW and just to push.     Stairs            Wheelchair Mobility    Modified Rankin (Stroke Patients Only)       Balance                                    Cognition Arousal/Alertness: Awake/alert Behavior During Therapy: WFL for tasks assessed/performed Overall Cognitive Status: Within Functional Limits for tasks assessed                      Exercises Total Joint Exercises Ankle Circles/Pumps: AROM;Both;10 reps Straight Leg Raises: AROM;5 reps;Left;Supine    General  Comments General comments (skin integrity, edema, etc.): Pt and daughter talking about L biceps tendon tear/rupture (?) .  Informed daughter that pt has not complained of it with mobility.       Pertinent Vitals/Pain Pain Assessment: No/denies pain Pain Score: 1  Pain Location: L knee Pain Descriptors / Indicators: Sore Pain Intervention(s): Repositioned;Ice applied    Home Living                      Prior Function            PT Goals (current goals can now be found in the care plan section) Acute Rehab PT Goals Patient Stated Goal: To go to rehab. PT Goal Formulation: With patient Time For Goal Achievement: 12/16/14 Potential to Achieve Goals: Good Progress towards PT goals: Progressing toward goals    Frequency  7X/week    PT Plan Current plan remains appropriate    Co-evaluation             End of Session Equipment Utilized During Treatment: Gait belt Activity Tolerance: Patient tolerated treatment well Patient left: in bed;with bed alarm set;with family/visitor present     Time: 0202-0224 PT Time Calculation (min) (ACUTE ONLY): 22 min  Charges:  $Gait Training: 8-22 mins  G Codes:      Carla [name] 12/10/2014, 2:48 PM
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
## 2014-12-10 NOTE — Progress Notes (Signed)
Occupational Therapy Treatment Patient Details Name: Carla [name] MRN: [id number] DOB: November 11, 1925 Today's Date: 12/10/2014    History of present illness L TKA due to OA.  Had R TKA  ~ 6 months ago with positive results   OT comments  Pt doing well. No lightheadedness reported during session. Pt stating L knee is sore but not painful. Practiced up and down from comfort height commode with bar. Min assist to descend safely to commode. Will follow to progress ADL independence for next venue.    Follow Up Recommendations  SNF;Supervision/Assistance - 24 hour    Equipment Recommendations     Defer to next venue.   Recommendations for Other Services      Precautions / Restrictions Precautions Precautions: Knee Required Braces or Orthoses: Knee Immobilizer - Left Knee Immobilizer - Left: On at all times Restrictions Weight Bearing Restrictions: No LLE Weight Bearing: Weight bearing as tolerated       Mobility Bed Mobility            Transfers Overall transfer level: Needs assistance Equipment used: Rolling walker (2 wheeled) Transfers: Sit to/from Stand Sit to Stand: Min guard         General transfer comment: min guard to stand from commode with bar. Min assist to descend to commode.    Balance                                   ADL       Grooming: Wash/dry hands;Min Dispensing optician: Min guard;Ambulation;BSC;Comfort height toilet;Minimal assistance;RW   Toileting- Clothing Manipulation and Hygiene: Minimal assistance;Sit to/from stand         General ADL Comments: Pt moves quickly and gave min cues to go slower for safety especially with turns. Min assist to descend to comfort height commode with grab bar and reaching for edge of commode. Min guard to stand using bar and pushing between legs on commode. Pt did better with pacing transfers on the way back out of the bathroom. Pt planning SNF. No  lightheadedness reported during session.       Vision                     Perception     Praxis      Cognition   Behavior During Therapy: WFL for tasks assessed/performed Overall Cognitive Status: Within Functional Limits for tasks assessed                       Extremity/Trunk Assessment               Exercises    Shoulder Instructions       General Comments      Pertinent Vitals/ Pain       Pain Assessment: No/denies pain Pain Score: 1  Pain Location: L knee Pain Descriptors / Indicators: Sore Pain Intervention(s): Repositioned;Ice applied  Home Living                                          Prior Functioning/Environment              Frequency Min 2X/week     Progress Toward Goals  OT Goals(current goals can now be found in the care plan section)  Progress towards OT goals: Progressing toward goals  Acute Rehab OT Goals Patient Stated Goal: To go to rehab.  Plan Discharge plan remains appropriate    Co-evaluation                 End of Session Equipment Utilized During Treatment: Rolling walker;Left knee immobilizer   Activity Tolerance Patient tolerated treatment well   Patient Left in chair;with call bell/phone within reach   Nurse Communication          Time: 6244-6950 OT Time Calculation (min): 17 min  Charges: OT General Charges $OT Visit: 1 Procedure OT Treatments $Therapeutic Activity: 8-22 mins  Jules Schick  [phone] 12/10/2014, 11:47 AM

## 2014-12-10 NOTE — Progress Notes (Signed)
Physical Therapy Treatment Patient Details Name: Carla [name] MRN: [id number] DOB: [birth year] Today's Date: 12/10/2014    History of Present Illness L TKA due to OA.  Had R TKA  ~ 6 months ago with positive results    PT Comments    Pt making steady progress.  Con't to recommend SNF for short term rehab.  Follow Up Recommendations  SNF     Equipment Recommendations  None recommended by PT    Recommendations for Other Services       Precautions / Restrictions Precautions Precautions: Knee Required Braces or Orthoses: Knee Immobilizer - Left Knee Immobilizer - Left: On at all times Restrictions Weight Bearing Restrictions: No LLE Weight Bearing: Weight bearing as tolerated    Mobility  Bed Mobility Overal bed mobility: Needs Assistance Bed Mobility: Supine to Sit     Supine to sit: Supervision     General bed mobility comments: HOB elevated but no use of rail  Transfers Overall transfer level: Needs assistance Equipment used: Rolling walker (2 wheeled) Transfers: Sit to/from Stand Sit to Stand: Supervision         General transfer comment: poor eccentric control with stand > sit.  Stood from bed and toilet.  Ambulation/Gait Ambulation/Gait assistance: Min guard Ambulation Distance (Feet): 60 Feet (plus 10' in room with toileting) Assistive device: Rolling walker (2 wheeled) Gait Pattern/deviations: Step-to pattern Gait velocity: decreased   General Gait Details: Step to pattern with RW with cues for head up   Stairs            Wheelchair Mobility    Modified Rankin (Stroke Patients Only)       Balance                                    Cognition Arousal/Alertness: Awake/alert Behavior During Therapy: WFL for tasks assessed/performed Overall Cognitive Status: Within Functional Limits for tasks assessed                      Exercises Total Joint Exercises Ankle Circles/Pumps: AROM;Both;10 reps Quad Sets:  Left;10 reps;Supine Heel Slides: AROM;Left;Supine;10 reps Straight Leg Raises: AAROM;Left;10 reps;Supine    General Comments General comments (skin integrity, edema, etc.): L AROM ~45-50 degrees      Pertinent Vitals/Pain Pain Assessment: No/denies pain    Home Living                      Prior Function            PT Goals (current goals can now be found in the care plan section) Acute Rehab PT Goals Patient Stated Goal: To go to rehab. PT Goal Formulation: With patient Time For Goal Achievement: 12/16/14 Potential to Achieve Goals: Good Progress towards PT goals: Progressing toward goals    Frequency  7X/week    PT Plan Current plan remains appropriate    Co-evaluation             End of Session Equipment Utilized During Treatment: Gait belt Activity Tolerance: Patient tolerated treatment well Patient left: in chair;with call bell/phone within reach     Time: 7416-3845 PT Time Calculation (min) (ACUTE ONLY): 27 min  Charges:  $Gait Training: 8-22 mins $Therapeutic Exercise: 8-22 mins                    G Codes:      Carla Panik  [name] 12/10/2014, 10:45 AM

## 2014-12-11 DIAGNOSIS — Z96652 Presence of left artificial knee joint: Secondary | ICD-10-CM | POA: Diagnosis not present

## 2014-12-11 DIAGNOSIS — D649 Anemia, unspecified: Secondary | ICD-10-CM | POA: Diagnosis not present

## 2014-12-11 DIAGNOSIS — R5381 Other malaise: Secondary | ICD-10-CM | POA: Diagnosis not present

## 2014-12-11 DIAGNOSIS — G8918 Other acute postprocedural pain: Secondary | ICD-10-CM | POA: Diagnosis not present

## 2014-12-11 DIAGNOSIS — M1990 Unspecified osteoarthritis, unspecified site: Secondary | ICD-10-CM | POA: Diagnosis not present

## 2014-12-11 DIAGNOSIS — J45909 Unspecified asthma, uncomplicated: Secondary | ICD-10-CM | POA: Diagnosis not present

## 2014-12-11 DIAGNOSIS — M1712 Unilateral primary osteoarthritis, left knee: Secondary | ICD-10-CM | POA: Diagnosis not present

## 2014-12-11 DIAGNOSIS — Z471 Aftercare following joint replacement surgery: Secondary | ICD-10-CM | POA: Diagnosis not present

## 2014-12-11 DIAGNOSIS — I10 Essential (primary) hypertension: Secondary | ICD-10-CM | POA: Diagnosis not present

## 2014-12-11 DIAGNOSIS — K219 Gastro-esophageal reflux disease without esophagitis: Secondary | ICD-10-CM | POA: Diagnosis not present

## 2014-12-11 LAB — CBC
HCT: 23.6 % — ABNORMAL LOW (ref 36.0–46.0)
HEMOGLOBIN: 7.8 g/dL — AB (ref 12.0–15.0)
MCH: 32.8 pg (ref 26.0–34.0)
MCHC: 33.1 g/dL (ref 30.0–36.0)
MCV: 99.2 fL (ref 78.0–100.0)
Platelets: 325 10*3/uL (ref 150–400)
RBC: 2.38 MIL/uL — ABNORMAL LOW (ref 3.87–5.11)
RDW: 15.2 % (ref 11.5–15.5)
WBC: 14.1 10*3/uL — ABNORMAL HIGH (ref 4.0–10.5)

## 2014-12-11 MED ORDER — TRAMADOL HCL 50 MG PO TABS: 50.0000 mg | ORAL_TABLET | Freq: Four times a day (QID) | ORAL | Status: DC | PRN

## 2014-12-11 MED ORDER — ONDANSETRON HCL 4 MG PO TABS: 4.0000 mg | ORAL_TABLET | Freq: Four times a day (QID) | ORAL | Status: DC | PRN

## 2014-12-11 MED ORDER — RIVAROXABAN 10 MG PO TABS: 10.0000 mg | ORAL_TABLET | Freq: Every day | ORAL | Status: DC

## 2014-12-11 MED ORDER — METOCLOPRAMIDE HCL 5 MG PO TABS: 5.0000 mg | ORAL_TABLET | Freq: Three times a day (TID) | ORAL | Status: DC | PRN

## 2014-12-11 MED ORDER — DOCUSATE SODIUM 100 MG PO CAPS
100.0000 mg | ORAL_CAPSULE | Freq: Two times a day (BID) | ORAL | Status: DC
Start: 2014-12-11 — End: 2015-01-16

## 2014-12-11 MED ORDER — METHOCARBAMOL 500 MG PO TABS
500.0000 mg | ORAL_TABLET | Freq: Four times a day (QID) | ORAL | Status: DC | PRN
Start: 2014-12-11 — End: 2015-01-16

## 2014-12-11 MED ORDER — DIAZEPAM 5 MG PO TABS: 5.0000 mg | ORAL_TABLET | Freq: Four times a day (QID) | ORAL | Status: DC | PRN

## 2014-12-11 MED ORDER — POLYETHYLENE GLYCOL 3350 17 G PO PACK: 17.0000 g | PACK | Freq: Every day | ORAL | Status: DC | PRN

## 2014-12-11 MED ORDER — OXYCODONE HCL 5 MG PO TABS: 5.0000 mg | ORAL_TABLET | ORAL | Status: DC | PRN

## 2014-12-11 MED ORDER — POLYSACCHARIDE IRON COMPLEX 150 MG PO CAPS
150.0000 mg | ORAL_CAPSULE | Freq: Two times a day (BID) | ORAL | Status: DC
Start: 2014-12-11 — End: 2015-01-16
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
## 2014-12-11 NOTE — Progress Notes (Signed)
Physical Therapy Treatment Patient Details Name: Carla [name] MRN: [id number] DOB: [birth year] Today's Date: 12/11/2014    History of Present Illness L TKA due to OA.  Had R TKA  ~ 6 months ago with positive results    PT Comments    PaTIENT REPORTS BAD PAIN EARLIER BUT NOW WELL CONTROLLED. PRogressing well with ROM and mobility  Follow Up Recommendations  SNF     Equipment Recommendations  None recommended by PT    Recommendations for Other Services       Precautions / Restrictions Precautions Precautions: Fall Required Braces or Orthoses: Knee Immobilizer - Left    Mobility  Bed Mobility                  Transfers   Equipment used: Rolling walker (2 wheeled) Transfers: Sit to/from Stand Sit to Stand: Supervision         General transfer comment: Cues to keep RW with her until ready to sit. cues for LLE position  Ambulation/Gait Ambulation/Gait assistance: Min guard Ambulation Distance (Feet): 70 Feet Assistive device: Rolling walker (2 wheeled) Gait Pattern/deviations: Step-to pattern;Antalgic     General Gait Details: Cues for sequence   Stairs            Wheelchair Mobility    Modified Rankin (Stroke Patients Only)       Balance                                    Cognition Arousal/Alertness: Awake/alert                          Exercises Total Joint Exercises Ankle Circles/Pumps: AROM;Both;10 reps Quad Sets: 10 reps;Supine;Both Short Arc Quad: AROM;Left;10 reps;Supine Heel Slides: AAROM;Left;10 reps;Supine Hip ABduction/ADduction: AROM;10 reps;Supine Straight Leg Raises: AAROM;Left;10 reps;Supine Goniometric ROM: 10-60 l knee    General Comments        Pertinent Vitals/Pain Faces Pain Scale: Hurts a little bit Pain Location: L knee Pain Descriptors / Indicators: Aching Pain Intervention(s): Monitored during session;Premedicated before session;Ice applied    Home Living                       Prior Function            PT Goals (current goals can now be found in the care plan section) Progress towards PT goals: Progressing toward goals    Frequency  7X/week    PT Plan Current plan remains appropriate    Co-evaluation             End of Session Equipment Utilized During Treatment: Left knee immobilizer Activity Tolerance: Patient tolerated treatment well Patient left: in chair;with call bell/phone within reach     Time: 0838-0926 PT Time Calculation (min) (ACUTE ONLY): 48 min  Charges:  $Gait Training: 8-22 mins $Therapeutic Exercise: 8-22 mins $Self Care/Home Management: 13-Jun-2023                    G Codes:      Carla [name] 12/11/2014, 2:37 PM

## 2014-12-11 NOTE — Progress Notes (Addendum)
Clinical Social Work Department CLINICAL SOCIAL WORK PLACEMENT NOTE 12/11/2014  Patient:  Carla [name], Carla [name]  Account Number:  [id number] Admit date:  12/08/2014  Clinical Social Worker:  Werner Lean, LCSW  Date/time:  12/11/2014 01:26 PM  Clinical Social Work is seeking post-discharge placement for this patient at the following level of care:   SKILLED NURSING   (*CSW will update this form in Epic as items are completed)     Patient/family provided with [place] Department of Clinical Social Work's list of facilities offering this level of care within the geographic area requested by the patient (or if unable, by the patient's family).  12/09/2014  Patient/family informed of their freedom to choose among providers that offer the needed level of care, that participate in Medicare, Medicaid or managed care program needed by the patient, have an available bed and are willing to accept the patient.    Patient/family informed of [facility]' ownership interest in [facility], as well as of the fact that they are under no obligation to receive care at this facility.  PASARR submitted to EDS on 12/08/2014 PASARR number received on 12/08/2014  FL2 transmitted to all facilities in geographic area requested by pt/family on  12/09/2014 FL2 transmitted to all facilities within larger geographic area on   Patient informed that his/her managed care company has contracts with or will negotiate with  certain facilities, including the following:     Patient/family informed of bed offers received:  12/09/2014 Patient chooses bed at  Physician recommends and patient chooses bed at  [facility]  Patient to be transferred to [facility]  on  12/11/2014 Patient to be transferred to facility by DAUGHTER Patient and family notified of transfer on 12/11/2014 Name of family member notified:  DAUGHTER  The following physician request were entered in Epic:   Additional  Comments: Pt is in agreement with d/c to SNF today. PT approved transport by car. NSG reviewed d/c summary, scripts, avs. Scripts included in d/c packet. D/c packet provided to pt by nsg prior to d/c.  Werner Lean LCSW [phone]
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
## 2014-12-14 DIAGNOSIS — D62 Acute posthemorrhagic anemia: Secondary | ICD-10-CM | POA: Diagnosis not present

## 2014-12-15 DIAGNOSIS — G8918 Other acute postprocedural pain: Secondary | ICD-10-CM | POA: Diagnosis not present

## 2014-12-15 DIAGNOSIS — R5381 Other malaise: Secondary | ICD-10-CM | POA: Diagnosis not present

## 2014-12-15 DIAGNOSIS — Z471 Aftercare following joint replacement surgery: Secondary | ICD-10-CM | POA: Diagnosis not present

## 2014-12-15 DIAGNOSIS — D649 Anemia, unspecified: Secondary | ICD-10-CM | POA: Diagnosis not present

## 2014-12-16 DIAGNOSIS — Z96652 Presence of left artificial knee joint: Secondary | ICD-10-CM | POA: Diagnosis not present

## 2014-12-16 DIAGNOSIS — Z471 Aftercare following joint replacement surgery: Secondary | ICD-10-CM | POA: Diagnosis not present

## 2014-12-17 DIAGNOSIS — Z96652 Presence of left artificial knee joint: Secondary | ICD-10-CM | POA: Diagnosis not present

## 2014-12-17 DIAGNOSIS — Z9181 History of falling: Secondary | ICD-10-CM | POA: Diagnosis not present

## 2014-12-17 DIAGNOSIS — J45909 Unspecified asthma, uncomplicated: Secondary | ICD-10-CM | POA: Diagnosis not present

## 2014-12-17 DIAGNOSIS — Z471 Aftercare following joint replacement surgery: Secondary | ICD-10-CM | POA: Diagnosis not present

## 2014-12-17 DIAGNOSIS — I10 Essential (primary) hypertension: Secondary | ICD-10-CM | POA: Diagnosis not present

## 2014-12-18 DIAGNOSIS — J45909 Unspecified asthma, uncomplicated: Secondary | ICD-10-CM | POA: Diagnosis not present

## 2014-12-18 DIAGNOSIS — Z96652 Presence of left artificial knee joint: Secondary | ICD-10-CM | POA: Diagnosis not present

## 2014-12-18 DIAGNOSIS — Z471 Aftercare following joint replacement surgery: Secondary | ICD-10-CM | POA: Diagnosis not present

## 2014-12-18 DIAGNOSIS — Z9181 History of falling: Secondary | ICD-10-CM | POA: Diagnosis not present

## 2014-12-18 DIAGNOSIS — I10 Essential (primary) hypertension: Secondary | ICD-10-CM | POA: Diagnosis not present

## 2014-12-19 DIAGNOSIS — Z96652 Presence of left artificial knee joint: Secondary | ICD-10-CM | POA: Diagnosis not present

## 2014-12-19 DIAGNOSIS — Z9181 History of falling: Secondary | ICD-10-CM | POA: Diagnosis not present

## 2014-12-19 DIAGNOSIS — J45909 Unspecified asthma, uncomplicated: Secondary | ICD-10-CM | POA: Diagnosis not present

## 2014-12-19 DIAGNOSIS — I10 Essential (primary) hypertension: Secondary | ICD-10-CM | POA: Diagnosis not present

## 2014-12-19 DIAGNOSIS — Z471 Aftercare following joint replacement surgery: Secondary | ICD-10-CM | POA: Diagnosis not present

## 2014-12-22 DIAGNOSIS — Z471 Aftercare following joint replacement surgery: Secondary | ICD-10-CM | POA: Diagnosis not present

## 2014-12-22 DIAGNOSIS — J45909 Unspecified asthma, uncomplicated: Secondary | ICD-10-CM | POA: Diagnosis not present

## 2014-12-22 DIAGNOSIS — Z9181 History of falling: Secondary | ICD-10-CM | POA: Diagnosis not present

## 2014-12-22 DIAGNOSIS — Z96652 Presence of left artificial knee joint: Secondary | ICD-10-CM | POA: Diagnosis not present

## 2014-12-22 DIAGNOSIS — I10 Essential (primary) hypertension: Secondary | ICD-10-CM | POA: Diagnosis not present

## 2014-12-24 DIAGNOSIS — Z471 Aftercare following joint replacement surgery: Secondary | ICD-10-CM | POA: Diagnosis not present

## 2014-12-24 DIAGNOSIS — Z96652 Presence of left artificial knee joint: Secondary | ICD-10-CM | POA: Diagnosis not present

## 2014-12-24 DIAGNOSIS — I10 Essential (primary) hypertension: Secondary | ICD-10-CM | POA: Diagnosis not present

## 2014-12-24 DIAGNOSIS — Z9181 History of falling: Secondary | ICD-10-CM | POA: Diagnosis not present

## 2014-12-24 DIAGNOSIS — J45909 Unspecified asthma, uncomplicated: Secondary | ICD-10-CM | POA: Diagnosis not present

## 2014-12-26 DIAGNOSIS — J45909 Unspecified asthma, uncomplicated: Secondary | ICD-10-CM | POA: Diagnosis not present

## 2014-12-26 DIAGNOSIS — Z9181 History of falling: Secondary | ICD-10-CM | POA: Diagnosis not present

## 2014-12-26 DIAGNOSIS — Z96652 Presence of left artificial knee joint: Secondary | ICD-10-CM | POA: Diagnosis not present

## 2014-12-26 DIAGNOSIS — I10 Essential (primary) hypertension: Secondary | ICD-10-CM | POA: Diagnosis not present

## 2014-12-26 DIAGNOSIS — Z471 Aftercare following joint replacement surgery: Secondary | ICD-10-CM | POA: Diagnosis not present

## 2014-12-29 DIAGNOSIS — Z471 Aftercare following joint replacement surgery: Secondary | ICD-10-CM | POA: Diagnosis not present

## 2014-12-29 DIAGNOSIS — Z9181 History of falling: Secondary | ICD-10-CM | POA: Diagnosis not present

## 2014-12-29 DIAGNOSIS — I10 Essential (primary) hypertension: Secondary | ICD-10-CM | POA: Diagnosis not present

## 2014-12-29 DIAGNOSIS — Z96652 Presence of left artificial knee joint: Secondary | ICD-10-CM | POA: Diagnosis not present

## 2014-12-29 DIAGNOSIS — J45909 Unspecified asthma, uncomplicated: Secondary | ICD-10-CM | POA: Diagnosis not present

## 2014-12-31 DIAGNOSIS — Z9181 History of falling: Secondary | ICD-10-CM | POA: Diagnosis not present

## 2014-12-31 DIAGNOSIS — Z471 Aftercare following joint replacement surgery: Secondary | ICD-10-CM | POA: Diagnosis not present

## 2014-12-31 DIAGNOSIS — J45909 Unspecified asthma, uncomplicated: Secondary | ICD-10-CM | POA: Diagnosis not present

## 2014-12-31 DIAGNOSIS — I10 Essential (primary) hypertension: Secondary | ICD-10-CM | POA: Diagnosis not present

## 2014-12-31 DIAGNOSIS — Z96652 Presence of left artificial knee joint: Secondary | ICD-10-CM | POA: Diagnosis not present

## 2015-01-02 DIAGNOSIS — Z9181 History of falling: Secondary | ICD-10-CM | POA: Diagnosis not present

## 2015-01-02 DIAGNOSIS — I10 Essential (primary) hypertension: Secondary | ICD-10-CM | POA: Diagnosis not present

## 2015-01-02 DIAGNOSIS — J45909 Unspecified asthma, uncomplicated: Secondary | ICD-10-CM | POA: Diagnosis not present

## 2015-01-02 DIAGNOSIS — Z471 Aftercare following joint replacement surgery: Secondary | ICD-10-CM | POA: Diagnosis not present

## 2015-01-02 DIAGNOSIS — Z96652 Presence of left artificial knee joint: Secondary | ICD-10-CM | POA: Diagnosis not present

## 2015-01-06 DIAGNOSIS — Z96652 Presence of left artificial knee joint: Secondary | ICD-10-CM | POA: Diagnosis not present

## 2015-01-06 DIAGNOSIS — Z471 Aftercare following joint replacement surgery: Secondary | ICD-10-CM | POA: Diagnosis not present

## 2015-01-15 DIAGNOSIS — M7071 Other bursitis of hip, right hip: Secondary | ICD-10-CM | POA: Diagnosis not present

## 2015-01-15 DIAGNOSIS — Z471 Aftercare following joint replacement surgery: Secondary | ICD-10-CM | POA: Diagnosis not present

## 2015-01-15 DIAGNOSIS — Z96652 Presence of left artificial knee joint: Secondary | ICD-10-CM | POA: Diagnosis not present

## 2015-01-16 ENCOUNTER — Encounter: Payer: Self-pay | Admitting: Pulmonary Disease

## 2015-01-16 ENCOUNTER — Ambulatory Visit (INDEPENDENT_AMBULATORY_CARE_PROVIDER_SITE_OTHER): Payer: Medicare Other | Admitting: Pulmonary Disease

## 2015-01-16 VITALS — BP 132/72 | HR 75 | Temp 97.6°F | Ht 61.0 in | Wt 127.4 lb

## 2015-01-16 DIAGNOSIS — R918 Other nonspecific abnormal finding of lung field: Secondary | ICD-10-CM

## 2015-01-16 DIAGNOSIS — J479 Bronchiectasis, uncomplicated: Secondary | ICD-10-CM | POA: Diagnosis not present

## 2015-01-16 NOTE — Patient Instructions (Signed)
Will try to keep you on striverdi 2 inhalations each am only.  Would use first thing in am upon arising.  Will see if we can help with patient assistance forms If you are unable to get striverdi, would recommend using albuterol inhaler 2 puffs first thing in the mornings upon awakening, then could use in afternoon if you were having chest congestion. If you are having increased mucus that is difficult to clear, let us know, and we can get you a flutter valve. followup in 76mos.

## 2015-01-16 NOTE — Progress Notes (Signed)
   Subjective:    Patient ID: Carla [name], female    DOB: [birth year], 81 y.o.   MRN: [id number]  HPI The patient comes in today for follow-up of her known bronchiectasis with subtle airflow obstruction on spirometry. We have tried her on a long-acting beta agonist at the last visit, and she saw significant improvement in her cough and congestion. She was unable to afford the medication, and has sent in patient assistance paperwork. She is having increased chest congestion first thing in the morning upon arising, and has to cough out you get sober period of time before she gets back to her usual baseline. She has no issues during the day, and feels that her breathing is at a reasonable baseline. She has had recent knee replacement surgery, and did quite well.   Review of Systems  Constitutional: Negative for fever and unexpected weight change.  HENT: Positive for postnasal drip. Negative for congestion, dental problem, ear pain, nosebleeds, rhinorrhea, sinus pressure, sneezing, sore throat and trouble swallowing.   Eyes: Negative for redness and itching.  Respiratory: Positive for cough. Negative for chest tightness, shortness of breath and wheezing.   Cardiovascular: Negative for palpitations and leg swelling.  Gastrointestinal: Negative for nausea and vomiting.  Genitourinary: Negative for dysuria.  Musculoskeletal: Negative for joint swelling.  Skin: Negative for rash.  Neurological: Negative for headaches.  Hematological: Does not bruise/bleed easily.  Psychiatric/Behavioral: Negative for dysphoric mood. The patient is not nervous/anxious.        Objective:   Physical Exam Well-developed female in no acute distress Nose without purulence or discharge noted Neck without lymphadenopathy or thyromegaly Chest reveals some scattered crackles, primarily in the bases, no wheezing Cardiac exam with regular rate and rhythm Lower extremities with minimal edema, no cyanosis Alert and  oriented, moves all 4 extremities.       Assessment & Plan:

## 2015-01-16 NOTE — Assessment & Plan Note (Addendum)
The patient has a history of bronchiectasis, and although her spirometry shows normal flows, the flow volume loop suggests airflow obstruction.  I suspect that she would have significant air trapping if we did lung volumes. She clearly saw a difference in her cough and congestion when she was on the striverdi samples.  I will give her more samples to continue on treatment, and we will also help with her patient assistance paperwork. I have asked her to use either her long-acting bronchodilator or short acting albuterol first thing in the morning upon arising to help with mucociliary clearance. If she continues to have congestion issues, I would get her a flutter valve to help with this.

## 2015-01-19 ENCOUNTER — Telehealth: Payer: Self-pay | Admitting: Pulmonary Disease

## 2015-01-19 DIAGNOSIS — H4922 Sixth [abducent] nerve palsy, left eye: Secondary | ICD-10-CM | POA: Diagnosis not present

## 2015-01-19 MED ORDER — OLODATEROL HCL 2.5 MCG/ACT IN AERS: INHALATION_SPRAY | RESPIRATORY_TRACT | Status: DC

## 2015-01-19 NOTE — Telephone Encounter (Signed)
Forms received and will have C[place] sign these then fax. Will hold in my inbox

## 2015-01-20 NOTE — Telephone Encounter (Signed)
done

## 2015-01-20 NOTE — Telephone Encounter (Signed)
Form is in [facility] green folder

## 2015-01-21 ENCOUNTER — Ambulatory Visit: Payer: Medicare Other | Admitting: Pulmonary Disease

## 2015-01-21 NOTE — Telephone Encounter (Signed)
Forms have been faxed in. Nothing further needed

## 2015-01-21 NOTE — Telephone Encounter (Signed)
Were these faxed in? Thanks.

## 2015-01-28 ENCOUNTER — Ambulatory Visit: Payer: Medicare Other | Admitting: Pulmonary Disease

## 2015-02-17 DIAGNOSIS — Z471 Aftercare following joint replacement surgery: Secondary | ICD-10-CM | POA: Diagnosis not present

## 2015-02-17 DIAGNOSIS — Z96652 Presence of left artificial knee joint: Secondary | ICD-10-CM | POA: Diagnosis not present

## 2015-03-02 DIAGNOSIS — Z961 Presence of intraocular lens: Secondary | ICD-10-CM | POA: Diagnosis not present

## 2015-03-02 DIAGNOSIS — H43813 Vitreous degeneration, bilateral: Secondary | ICD-10-CM | POA: Diagnosis not present

## 2015-03-02 DIAGNOSIS — H3531 Nonexudative age-related macular degeneration: Secondary | ICD-10-CM | POA: Diagnosis not present

## 2015-03-02 DIAGNOSIS — H4922 Sixth [abducent] nerve palsy, left eye: Secondary | ICD-10-CM | POA: Diagnosis not present

## 2015-03-17 DIAGNOSIS — Z1231 Encounter for screening mammogram for malignant neoplasm of breast: Secondary | ICD-10-CM | POA: Diagnosis not present

## 2015-03-19 DIAGNOSIS — R928 Other abnormal and inconclusive findings on diagnostic imaging of breast: Secondary | ICD-10-CM | POA: Diagnosis not present

## 2015-03-19 DIAGNOSIS — N6091 Unspecified benign mammary dysplasia of right breast: Secondary | ICD-10-CM | POA: Diagnosis not present

## 2015-04-16 DIAGNOSIS — Z471 Aftercare following joint replacement surgery: Secondary | ICD-10-CM | POA: Diagnosis not present

## 2015-04-16 DIAGNOSIS — Z96652 Presence of left artificial knee joint: Secondary | ICD-10-CM | POA: Diagnosis not present

## 2015-05-05 ENCOUNTER — Ambulatory Visit: Payer: Self-pay | Admitting: Orthopedic Surgery

## 2015-05-05 NOTE — Progress Notes (Signed)
Preoperative surgical orders have been place into the Epic hospital system for Carla [name] on 05/05/2015, 12:33 PM  by Mickel Crow for surgery on 05/19/2015.  Preop Knee Scope orders including IV Tylenol and IV Decadron as long as there are no contraindications to the above medications. [name], PA-C

## 2015-05-14 ENCOUNTER — Other Ambulatory Visit (HOSPITAL_COMMUNITY): Payer: Self-pay | Admitting: *Deleted

## 2015-05-15 ENCOUNTER — Encounter (HOSPITAL_COMMUNITY): Payer: Self-pay

## 2015-05-15 ENCOUNTER — Encounter (HOSPITAL_COMMUNITY)
Admission: RE | Admit: 2015-05-15 | Discharge: 2015-05-15 | Disposition: A | Discharge status: Home / Self Care | Payer: Medicare Other | Source: Ambulatory Visit | Attending: Orthopedic Surgery | Admitting: Orthopedic Surgery

## 2015-05-15 DIAGNOSIS — Z01812 Encounter for preprocedural laboratory examination: Secondary | ICD-10-CM | POA: Diagnosis not present

## 2015-05-15 DIAGNOSIS — M25862 Other specified joint disorders, left knee: Secondary | ICD-10-CM | POA: Diagnosis not present

## 2015-05-15 HISTORY — DX: Pneumonia, unspecified organism: J18.9

## 2015-05-15 HISTORY — DX: Reserved for inherently not codable concepts without codable children: IMO0001

## 2015-05-15 HISTORY — DX: Anemia, unspecified: D64.9

## 2015-05-15 LAB — BASIC METABOLIC PANEL
Anion gap: 8 (ref 5–15)
BUN: 12 mg/dL (ref 6–20)
CHLORIDE: 102 mmol/L (ref 101–111)
CO2: 28 mmol/L (ref 22–32)
Calcium: 9.1 mg/dL (ref 8.9–10.3)
Creatinine, Ser: 0.53 mg/dL (ref 0.44–1.00)
GFR calc Af Amer: >60 mL/min (ref >60)
GFR calc non Af Amer: >60 mL/min (ref >60)
Glucose, Bld: 93 mg/dL (ref 65–99)
POTASSIUM: 3.5 mmol/L (ref 3.5–5.1)
SODIUM: 138 mmol/L (ref 135–145)

## 2015-05-15 LAB — CBC
HEMATOCRIT: 38.7 % (ref 36.0–46.0)
Hemoglobin: 12.1 g/dL (ref 12.0–15.0)
MCH: 30.9 pg (ref 26.0–34.0)
MCHC: 31.3 g/dL (ref 30.0–36.0)
MCV: 99 fL (ref 78.0–100.0)
Platelets: 388 10*3/uL (ref 150–400)
RBC: 3.91 MIL/uL (ref 3.87–5.11)
RDW: 14.7 % (ref 11.5–15.5)
WBC: 9.8 10*3/uL (ref 4.0–10.5)

## 2015-05-15 LAB — SURGICAL PCR SCREEN
MRSA, PCR: NEGATIVE
Staphylococcus aureus: NEGATIVE

## 2015-05-15 NOTE — Progress Notes (Addendum)
05-06-15 - LOV - Dr. Ardeth Perfect (PCP) - in chart 01-16-15 - LOV - Dr. Gwenette Greet (pulmon.) - EPIC 12-08-14 - hx. LTK athroplasty - EPIC 11-05-14 - CT Chest W/O contrast - EPIC 09-01-14 - EKG - in chart

## 2015-05-15 NOTE — Patient Instructions (Signed)
Carla [name]  05/15/2015   Your procedure is scheduled on:  May 19, 2015  Report to [facility]  Entrance take [place]  elevators to 3rd floor to  [place] at  12:30 AM.  Call this number if you have problems the morning of surgery [phone]   Remember: ONLY 1 PERSON MAY GO WITH YOU TO SHORT STAY TO GET  READY MORNING OF [place].  Do not eat food after midnight.  Follow clear liquid diet until 6:30 AM morning of surgery.  Then nothing by mouth.    Take these medicines the morning of surgery with A SIP OF WATER:  Albuterol inhaler, Zyrtec, Flonase, Olodaterol, Prilosec                               You may not have any metal on your body including hair pins and              piercings  Do not wear jewelry, make-up, lotions, powders or perfumes, deodorant             Do not wear nail polish.  Do not shave  48 hours prior to surgery.               Do not bring valuables to the hospital. [place].  Contacts, dentures or bridgework may not be worn into surgery.      Patients discharged the day of surgery will not be allowed to drive home.  Name and phone number of your driver:  Special Instructions: coughing and deep breathing exercises, leg exercises   CLEAR LIQUID DIET   Foods Allowed                                                                     Foods Excluded  Coffee and tea, regular and decaf                             liquids that you cannot  Plain Jell-O in any flavor                                             see through such as: Fruit ices (not with fruit pulp)                                     milk, soups, orange juice  Iced Popsicles                                    All solid food Carbonated beverages, regular and diet  Cranberry, grape and apple juices Sports drinks like Gatorade Lightly seasoned clear broth or consume(fat  free) Sugar, honey syrup  Sample Menu Breakfast                                Lunch                                     Supper Cranberry juice                    Beef broth                            Chicken broth Jell-O                                     Grape juice                           Apple juice Coffee or tea                        Jell-O                                      Popsicle                                                Coffee or tea                        Coffee or tea  _____________________________________________________________________                Please read over the following fact sheets you were given: _____________________________________________________________________             [facility] - Preparing for Surgery Before surgery, you can play an important role.  Because skin is not sterile, your skin needs to be as free of germs as possible.  You can reduce the number of germs on your skin by washing with CHG (chlorahexidine gluconate) soap before surgery.  CHG is an antiseptic cleaner which kills germs and bonds with the skin to continue killing germs even after washing. Please DO NOT use if you have an allergy to CHG or antibacterial soaps.  If your skin becomes reddened/irritated stop using the CHG and inform your nurse when you arrive at Short Stay. Do not shave (including legs and underarms) for at least 48 hours prior to the first CHG shower.  You may shave your face/neck. Please follow these instructions carefully:  1.  Shower with CHG Soap the night before surgery and the  morning of Surgery.  2.  If you choose to wash your hair, wash your hair first as usual with your  normal  shampoo.  3.  After you shampoo, rinse your hair and body thoroughly to remove the  shampoo.  4.  Use CHG as you would any other liquid soap.  You can apply chg directly  to the skin and wash                       Gently with a scrungie or clean  washcloth.  5.  Apply the CHG Soap to your body ONLY FROM THE NECK DOWN.   Do not use on face/ open                           Wound or open sores. Avoid contact with eyes, ears mouth and genitals (private parts).                       Wash face,  Genitals (private parts) with your normal soap.             6.  Wash thoroughly, paying special attention to the area where your surgery  will be performed.  7.  Thoroughly rinse your body with warm water from the neck down.  8.  DO NOT shower/wash with your normal soap after using and rinsing off  the CHG Soap.                9.  Pat yourself dry with a clean towel.            10.  Wear clean pajamas.            11.  Place clean sheets on your bed the night of your first shower and do not  sleep with pets. Day of Surgery : Do not apply any lotions/deodorants the morning of surgery.  Please wear clean clothes to the hospital/surgery center.  FAILURE TO FOLLOW THESE INSTRUCTIONS MAY RESULT IN THE CANCELLATION OF YOUR SURGERY PATIENT SIGNATURE_________________________________  NURSE SIGNATURE__________________________________  ________________________________________________________________________

## 2015-05-19 ENCOUNTER — Encounter (HOSPITAL_COMMUNITY)
Admission: RE | Disposition: A | Discharge status: Home / Self Care | Payer: Self-pay | Source: Ambulatory Visit | Attending: Orthopedic Surgery

## 2015-05-19 ENCOUNTER — Encounter (HOSPITAL_COMMUNITY): Payer: Self-pay

## 2015-05-19 ENCOUNTER — Ambulatory Visit (HOSPITAL_COMMUNITY)
Admission: RE | Admit: 2015-05-19 | Discharge: 2015-05-19 | Disposition: A | Discharge status: Home / Self Care | Payer: Medicare Other | Source: Ambulatory Visit | Attending: Orthopedic Surgery | Admitting: Orthopedic Surgery

## 2015-05-19 ENCOUNTER — Ambulatory Visit (HOSPITAL_COMMUNITY): Payer: Medicare Other | Admitting: Anesthesiology

## 2015-05-19 DIAGNOSIS — M179 Osteoarthritis of knee, unspecified: Secondary | ICD-10-CM | POA: Diagnosis not present

## 2015-05-19 DIAGNOSIS — K219 Gastro-esophageal reflux disease without esophagitis: Secondary | ICD-10-CM | POA: Diagnosis not present

## 2015-05-19 DIAGNOSIS — I10 Essential (primary) hypertension: Secondary | ICD-10-CM | POA: Insufficient documentation

## 2015-05-19 DIAGNOSIS — Z79890 Hormone replacement therapy (postmenopausal): Secondary | ICD-10-CM | POA: Diagnosis not present

## 2015-05-19 DIAGNOSIS — Z7951 Long term (current) use of inhaled steroids: Secondary | ICD-10-CM | POA: Insufficient documentation

## 2015-05-19 DIAGNOSIS — Z7982 Long term (current) use of aspirin: Secondary | ICD-10-CM | POA: Diagnosis not present

## 2015-05-19 DIAGNOSIS — Z79899 Other long term (current) drug therapy: Secondary | ICD-10-CM | POA: Insufficient documentation

## 2015-05-19 DIAGNOSIS — M25869 Other specified joint disorders, unspecified knee: Secondary | ICD-10-CM

## 2015-05-19 DIAGNOSIS — Z96653 Presence of artificial knee joint, bilateral: Secondary | ICD-10-CM | POA: Diagnosis not present

## 2015-05-19 DIAGNOSIS — J45909 Unspecified asthma, uncomplicated: Secondary | ICD-10-CM | POA: Insufficient documentation

## 2015-05-19 DIAGNOSIS — M25862 Other specified joint disorders, left knee: Secondary | ICD-10-CM | POA: Diagnosis not present

## 2015-05-19 DIAGNOSIS — Z96659 Presence of unspecified artificial knee joint: Secondary | ICD-10-CM

## 2015-05-19 DIAGNOSIS — M25562 Pain in left knee: Secondary | ICD-10-CM | POA: Diagnosis present

## 2015-05-19 DIAGNOSIS — M228X2 Other disorders of patella, left knee: Secondary | ICD-10-CM | POA: Diagnosis not present

## 2015-05-19 HISTORY — PX: KNEE ARTHROSCOPY: SHX127

## 2015-05-19 SURGERY — ARTHROSCOPY, KNEE
Anesthesia: Spinal | Site: Knee | Laterality: Left

## 2015-05-19 MED ORDER — ACETAMINOPHEN 10 MG/ML IV SOLN
INTRAVENOUS | Status: AC
  Filled 2015-05-19: qty 100

## 2015-05-19 MED ORDER — DEXAMETHASONE SODIUM PHOSPHATE 10 MG/ML IJ SOLN
10.0000 mg | Freq: Once | INTRAMUSCULAR | Status: AC
  Administered 2015-05-19: 10 mg via INTRAVENOUS

## 2015-05-19 MED ORDER — PROPOFOL 10 MG/ML IV BOLUS
INTRAVENOUS | Status: DC | PRN
  Administered 2015-05-19 (×2): 20 mg via INTRAVENOUS

## 2015-05-19 MED ORDER — BUPIVACAINE-EPINEPHRINE (PF) 0.25% -1:200000 IJ SOLN
INTRAMUSCULAR | Status: AC
  Filled 2015-05-19: qty 30

## 2015-05-19 MED ORDER — LACTATED RINGERS IV SOLN
INTRAVENOUS | Status: DC
  Administered 2015-05-19: 1000 mL via INTRAVENOUS
  Administered 2015-05-19: 16:00:00 via INTRAVENOUS

## 2015-05-19 MED ORDER — FENTANYL CITRATE (PF) 100 MCG/2ML IJ SOLN: 25.0000 ug | INTRAMUSCULAR | Status: DC | PRN

## 2015-05-19 MED ORDER — ONDANSETRON HCL 4 MG/2ML IJ SOLN
INTRAMUSCULAR | Status: DC | PRN
  Administered 2015-05-19: 4 mg via INTRAVENOUS

## 2015-05-19 MED ORDER — METHOCARBAMOL 500 MG PO TABS: 500.0000 mg | ORAL_TABLET | Freq: Four times a day (QID) | ORAL | Status: DC

## 2015-05-19 MED ORDER — BUPIVACAINE-EPINEPHRINE 0.25% -1:200000 IJ SOLN
INTRAMUSCULAR | Status: DC | PRN
  Administered 2015-05-19: 20 mL

## 2015-05-19 MED ORDER — PHENYLEPHRINE HCL 10 MG/ML IJ SOLN
INTRAMUSCULAR | Status: DC | PRN
  Administered 2015-05-19 (×3): 80 ug via INTRAVENOUS

## 2015-05-19 MED ORDER — PROPOFOL 10 MG/ML IV BOLUS
INTRAVENOUS | Status: AC
  Filled 2015-05-19: qty 20

## 2015-05-19 MED ORDER — MEPERIDINE HCL 50 MG/ML IJ SOLN: 6.2500 mg | INTRAMUSCULAR | Status: DC | PRN

## 2015-05-19 MED ORDER — LACTATED RINGERS IR SOLN
Status: DC | PRN
  Administered 2015-05-19: 9000 mL

## 2015-05-19 MED ORDER — CEFAZOLIN SODIUM-DEXTROSE 2-3 GM-% IV SOLR
2.0000 g | INTRAVENOUS | Status: AC
  Administered 2015-05-19: 2 g via INTRAVENOUS

## 2015-05-19 MED ORDER — CEFAZOLIN SODIUM-DEXTROSE 2-3 GM-% IV SOLR
INTRAVENOUS | Status: AC
  Filled 2015-05-19: qty 50

## 2015-05-19 MED ORDER — HYDROCODONE-ACETAMINOPHEN 5-325 MG PO TABS: 1.0000 | ORAL_TABLET | Freq: Four times a day (QID) | ORAL | Status: DC | PRN

## 2015-05-19 MED ORDER — DEXAMETHASONE SODIUM PHOSPHATE 10 MG/ML IJ SOLN
INTRAMUSCULAR | Status: AC
  Filled 2015-05-19: qty 1

## 2015-05-19 MED ORDER — PHENYLEPHRINE 40 MCG/ML (10ML) SYRINGE FOR IV PUSH (FOR BLOOD PRESSURE SUPPORT)
PREFILLED_SYRINGE | INTRAVENOUS | Status: AC
  Filled 2015-05-19: qty 10

## 2015-05-19 MED ORDER — CHLORHEXIDINE GLUCONATE 4 % EX LIQD: 60.0000 mL | Freq: Once | CUTANEOUS | Status: DC

## 2015-05-19 MED ORDER — BUPIVACAINE IN DEXTROSE 0.75-8.25 % IT SOLN
INTRATHECAL | Status: DC | PRN
  Administered 2015-05-19: 1.2 mL via INTRATHECAL

## 2015-05-19 MED ORDER — ONDANSETRON HCL 4 MG/2ML IJ SOLN
INTRAMUSCULAR | Status: AC
  Filled 2015-05-19: qty 2

## 2015-05-19 MED ORDER — ACETAMINOPHEN 10 MG/ML IV SOLN
1000.0000 mg | Freq: Once | INTRAVENOUS | Status: AC
  Administered 2015-05-19: 1000 mg via INTRAVENOUS
  Filled 2015-05-19: qty 100

## 2015-05-19 MED ORDER — PROPOFOL INFUSION 10 MG/ML OPTIME
INTRAVENOUS | Status: DC | PRN
  Administered 2015-05-19: 65 ug/kg/min via INTRAVENOUS

## 2015-05-19 MED ORDER — OLODATEROL HCL 2.5 MCG/ACT IN AERS: 2.0000 | INHALATION_SPRAY | Freq: Every day | RESPIRATORY_TRACT | Status: DC

## 2015-05-19 MED ORDER — SODIUM CHLORIDE 0.9 % IV SOLN: INTRAVENOUS | Status: DC

## 2015-05-19 MED ORDER — PROMETHAZINE HCL 25 MG/ML IJ SOLN: 6.2500 mg | INTRAMUSCULAR | Status: DC | PRN

## 2015-05-19 SURGICAL SUPPLY — 27 items
BANDAGE ELASTIC 6 VELCRO ST LF (GAUZE/BANDAGES/DRESSINGS) ×3 IMPLANT
BLADE 4.2CUDA (BLADE) ×3 IMPLANT
COVER SURGICAL LIGHT HANDLE (MISCELLANEOUS) ×3 IMPLANT
CUFF TOURN SGL QUICK 34 (TOURNIQUET CUFF) ×3
CUFF TRNQT CYL 34X4X40X1 (TOURNIQUET CUFF) ×1 IMPLANT
DRAPE U-SHAPE 47X51 STRL (DRAPES) ×3 IMPLANT
DRSG EMULSION OIL 3X3 NADH (GAUZE/BANDAGES/DRESSINGS) ×3 IMPLANT
DRSG PAD ABDOMINAL 8X10 ST (GAUZE/BANDAGES/DRESSINGS) ×3 IMPLANT
DURAPREP 26ML APPLICATOR (WOUND CARE) ×3 IMPLANT
GAUZE SPONGE 4X4 12PLY STRL (GAUZE/BANDAGES/DRESSINGS) ×3 IMPLANT
GLOVE BIO SURGEON STRL SZ8 (GLOVE) ×3 IMPLANT
GLOVE BIOGEL PI IND STRL 8 (GLOVE) ×1 IMPLANT
GLOVE BIOGEL PI INDICATOR 8 (GLOVE) ×2
GOWN STRL REUS W/TWL LRG LVL3 (GOWN DISPOSABLE) ×3 IMPLANT
KIT BASIN OR (CUSTOM PROCEDURE TRAY) ×3 IMPLANT
MANIFOLD NEPTUNE II (INSTRUMENTS) ×3 IMPLANT
PACK ARTHROSCOPY WL (CUSTOM PROCEDURE TRAY) ×3 IMPLANT
PACK ICE MAXI GEL EZY WRAP (MISCELLANEOUS) ×9 IMPLANT
PADDING CAST COTTON 6X4 STRL (CAST SUPPLIES) ×4 IMPLANT
PEN SKIN MARKING BROAD (MISCELLANEOUS) ×3 IMPLANT
POSITIONER SURGICAL ARM (MISCELLANEOUS) ×3 IMPLANT
SET ARTHROSCOPY TUBING (MISCELLANEOUS) ×3
SET ARTHROSCOPY TUBING LN (MISCELLANEOUS) ×1 IMPLANT
SUT ETHILON 4 0 PS 2 18 (SUTURE) ×3 IMPLANT
TOWEL OR 17X26 10 PK STRL BLUE (TOWEL DISPOSABLE) ×3 IMPLANT
WAND 90 DEG TURBOVAC W/CORD (SURGICAL WAND) ×2 IMPLANT
WRAP KNEE MAXI GEL POST OP (GAUZE/BANDAGES/DRESSINGS) ×3 IMPLANT

## 2015-05-19 NOTE — Anesthesia Preprocedure Evaluation (Signed)
Anesthesia Evaluation  Patient identified by MRN, date of birth, ID band Patient awake    Reviewed: Allergy & Precautions, H&P , NPO status , Patient's Chart, lab work & pertinent test results  Airway Mallampati: II  TM Distance: >3 FB Neck ROM: Full    Dental no notable dental hx.    Pulmonary asthma ,  breath sounds clear to auscultation  Pulmonary exam normal       Cardiovascular hypertension, Pt. on medications Normal cardiovascular examRhythm:Regular Rate:Normal     Neuro/Psych negative neurological ROS  negative psych ROS   GI/Hepatic Neg liver ROS, GERD-  Medicated,  Endo/Other  negative endocrine ROS  Renal/GU negative Renal ROS  negative genitourinary   Musculoskeletal negative musculoskeletal ROS (+)   Abdominal   Peds negative pediatric ROS (+)  Hematology negative hematology ROS (+)   Anesthesia Other Findings   Reproductive/Obstetrics negative OB ROS                             Anesthesia Physical  Anesthesia Plan  ASA: II  Anesthesia Plan: Spinal   Post-op Pain Management:    Induction: Intravenous  Airway Management Planned:   Additional Equipment:   Intra-op Plan:   Post-operative Plan:   Informed Consent: I have reviewed the patients History and Physical, chart, labs and discussed the procedure including the risks, benefits and alternatives for the proposed anesthesia with the patient or authorized representative who has indicated his/her understanding and acceptance.   Dental advisory given  Plan Discussed with: CRNA  Anesthesia Plan Comments: (Discussed risks/benefits of spinal including headache, backache, failure, bleeding, infection, and nerve damage. Patient consents to spinal. Questions answered. Coagulation studies and platelet count acceptable.  She has a history of spinal stenosis and has had some spinal injections by Dr. Nelva Bush. Spinal stenosis  may make placement of a spinal anesthetic more difficult.)        Anesthesia Quick Evaluation

## 2015-05-19 NOTE — Brief Op Note (Signed)
05/19/2015  3:55 PM  PATIENT:  Celesta Aver  81 y.o. female  PRE-OPERATIVE DIAGNOSIS:  PATELLA CLUNK SYNDROME OF LEFT KNEE  POST-OPERATIVE DIAGNOSIS:  PATELLA CLUNK SYNDROME OF LEFT KNEE  PROCEDURE:  Procedure(s): LEFT ARTHROSCOPY KNEE WITH SYNOVECTOMY (Left)  SURGEON:  Surgeon(s) and Role:    * Gaynelle Arabian, MD - Primary  PHYSICIAN ASSISTANT:   ASSISTANTS: none   ANESTHESIA:   spinal  EBL:  Total I/O In: 700 [I.V.:700] Out: 25 [Blood:25]  BLOOD ADMINISTERED:none  DRAINS: none   LOCAL MEDICATIONS USED:  MARCAINE      COUNTS:  YES  TOURNIQUET:  * No tourniquets in log *  DICTATION: .Other Dictation: Dictation Number [id number]  PLAN OF CARE: Discharge to home after PACU  PATIENT DISPOSITION:  PACU - hemodynamically stable.

## 2015-05-19 NOTE — Anesthesia Procedure Notes (Signed)
Spinal Patient location during procedure: OR Staffing Anesthesiologist: Dashay Giesler Performed by: anesthesiologist  Preanesthetic Checklist Completed: patient identified, site marked, surgical consent, pre-op evaluation, timeout performed, IV checked, risks and benefits discussed and monitors and equipment checked Spinal Block Patient position: sitting Prep: Betadine Patient monitoring: heart rate, continuous pulse ox and blood pressure Approach: right paramedian Location: L3-4 Injection technique: single-shot Needle Needle type: Spinocan  Needle gauge: 22 G Needle length: 9 cm Additional Notes Expiration date of kit checked and confirmed. Patient tolerated procedure well, without complications.     

## 2015-05-19 NOTE — Anesthesia Postprocedure Evaluation (Signed)
  Anesthesia Post-op Note  Patient: Carla [name]  Procedure(s) Performed: Procedure(s) (LRB): LEFT ARTHROSCOPY KNEE WITH SYNOVECTOMY (Left)  Patient Location: PACU  Anesthesia Type: Spinal  Level of Consciousness: awake and alert   Airway and Oxygen Therapy: Patient Spontanous Breathing  Post-op Pain: mild  Post-op Assessment: Post-op Vital signs reviewed, Patient's Cardiovascular Status Stable, Respiratory Function Stable, Patent Airway and No signs of Nausea or vomiting  Last Vitals:  Filed Vitals:   05/19/15 1901  BP: 126/49  Pulse: 71  Temp:   Resp: 16    Post-op Vital Signs: stable   Complications: No apparent anesthesia complications

## 2015-05-19 NOTE — Transfer of Care (Signed)
Immediate Anesthesia Transfer of Care Note  Patient: Carla [name]  Procedure(s) Performed: Procedure(s): LEFT ARTHROSCOPY KNEE WITH SYNOVECTOMY (Left)  Patient Location: PACU  Anesthesia Type:MAC and Spinal  Level of Consciousness: awake, alert , oriented and patient cooperative  Airway & Oxygen Therapy: Patient Spontanous Breathing and Patient connected to face mask oxygen  Post-op Assessment: Report given to RN and Post -op Vital signs reviewed and stable  Post vital signs: Reviewed and stable  Last Vitals:  Filed Vitals:   05/19/15 1240  BP: 137/64  Pulse: 80  Temp: 36.4 C  Resp: 18    Complications: No apparent anesthesia complications

## 2015-05-19 NOTE — Discharge Instructions (Signed)
° °Dr. Frank [name] °Total Joint Specialist °[facility] °[address] °[place], [place] [address] °[phone] ° ° °Arthroscopic Procedure, Knee °An arthroscopic procedure can find what is wrong with your knee. °PROCEDURE °Arthroscopy is a surgical technique that allows your orthopedic surgeon to diagnose and treat your knee injury with accuracy. They will look into your knee through a small instrument. This is almost like a small (pencil sized) telescope. Because arthroscopy affects your knee less than open knee surgery, you can anticipate a more rapid recovery. Taking an active role by following your caregiver's instructions will help with rapid and complete recovery. Use crutches, rest, elevation, ice, and knee exercises as instructed. The length of recovery depends on various factors including type of injury, age, physical condition, medical conditions, and your rehabilitation. °Your knee is the joint between the large bones (femur and tibia) in your leg. Cartilage covers these bone ends which are smooth and slippery and allow your knee to bend and move smoothly. Two menisci, thick, semi-lunar shaped pads of cartilage which form a rim inside the joint, help absorb shock and stabilize your knee. Ligaments bind the bones together and support your knee joint. Muscles move the joint, help support your knee, and take stress off the joint itself. Because of this all programs and physical therapy to rehabilitate an injured or repaired knee require rebuilding and strengthening your muscles. °AFTER THE PROCEDURE °· After the procedure, you will be moved to a recovery area until most of the effects of the medication have worn off. Your caregiver will discuss the test results with you.  °· Only take over-the-counter or prescription medicines for pain, discomfort, or fever as directed by your caregiver.  °SEEK MEDICAL CARE IF:  °· You have increased bleeding from your wounds.  °· You see  redness, swelling, or have increasing pain in your wounds.  °· You have pus coming from your wound.  °· You have an oral temperature above 102° F (38.9° C).  °· You notice a bad smell coming from the wound or dressing.  °· You have severe pain with any motion of your knee.  °SEEK IMMEDIATE MEDICAL CARE IF:  °· You develop a rash.  °· You have difficulty breathing.  °· You have any allergic problems.  °FURTHER INSTRUCTIONS:  °· ICE to the affected knee every three hours for 30 minutes at a time and then as needed for pain and swelling.  Continue to use ice on the knee for pain and swelling from surgery. You may notice swelling that will progress down to the foot and ankle.  This is normal after surgery.  Elevate the leg when you are not up walking on it.   ° °DIET °You may resume your previous home diet once your are discharged from the hospital. ° °DRESSING / WOUND CARE / SHOWERING °You may start showering two days after being discharged home but do not submerge the incisions under water.  °Change dressing 48 hours after the procedure and then cover the small incisions with band aids until your follow up visit. °Change the surgical dressings daily and reapply a dry dressing each time.  ° °ACTIVITY °Walk with your walker as instructed. °Use walker as long as suggested by your caregivers. °Avoid periods of inactivity such as sitting longer than an hour when not asleep. This helps prevent blood clots.  °You may resume a sexual relationship in one month or when given the OK by your doctor.  °You may return to   to work once you are cleared by your doctor.  °Do not drive a car for 6 weeks or until released by you surgeon.  °Do not drive while taking narcotics. ° ° °POSTOPERATIVE CONSTIPATION PROTOCOL °Constipation - defined medically as fewer than three stools per week and severe constipation as less than one stool per week. ° °One of the most common issues patients have following surgery is constipation.  Even if you have  a regular bowel pattern at home, your normal regimen is likely to be disrupted due to multiple reasons following surgery.  Combination of anesthesia, postoperative narcotics, change in appetite and fluid intake all can affect your bowels.  In order to avoid complications following surgery, here are some recommendations in order to help you during your recovery period. ° °Colace (docusate) - Pick up an over-the-counter form of Colace or another stool softener and take twice a day as long as you are requiring postoperative pain medications.  Take with a full glass of water daily.  If you experience loose stools or diarrhea, hold the colace until you stool forms back up.  If your symptoms do not get better within 1 week or if they get worse, check with your doctor. ° °Dulcolax (bisacodyl) - Pick up over-the-counter and take as directed by the product packaging as needed to assist with the movement of your bowels.  Take with a full glass of water.  Use this product as needed if not relieved by Colace only.  ° °MiraLax (polyethylene glycol) - Pick up over-the-counter to have on hand.  MiraLax is a solution that will increase the amount of water in your bowels to assist with bowel movements.  Take as directed and can mix with a glass of water, juice, soda, coffee, or tea.  Take if you go more than two days without a movement. °Do not use MiraLax more than once per day. Call your doctor if you are still constipated or irregular after using this medication for 7 days in a row. ° °If you continue to have problems with postoperative constipation, please contact the office for further assistance and recommendations.  If you experience "the worst abdominal pain ever" or develop nausea or vomiting, please contact the office immediatly for further recommendations for treatment. ° °ITCHING ° If you experience itching with your medications, try taking only a single pain pill, or even half a pain pill at a time.  You can also use  Benadryl over the counter for itching or also to help with sleep.  ° °TED HOSE STOCKINGS °Wear the elastic stockings on both legs for three weeks following surgery during the day but you may remove then at night for sleeping. ° °MEDICATIONS °See your medication summary on the “After Visit Summary” that the nursing staff will review with you prior to discharge.  You may have some home medications which will be placed on hold until you complete the course of blood thinner medication.  It is important for you to complete the blood thinner medication as prescribed by your surgeon.  Continue your approved medications as instructed at time of discharge. °Do not drive while taking narcotics.  ° °PRECAUTIONS °If you experience chest pain or shortness of breath - call 911 immediately for transfer to the hospital emergency department.  °If you develop a fever greater that 101 F, purulent drainage from wound, increased redness or drainage from wound, foul odor from the wound/dressing, or calf pain - CONTACT YOUR SURGEON.   °                                                °  FOLLOW-UP APPOINTMENTS Make sure you keep all of your appointments after your operation with your surgeon and caregivers. You should call the office at [phone]  and make an appointment for approximately one week after the date of your surgery or on the date instructed by your surgeon outlined in the "After Visit Summary".  RANGE OF MOTION AND STRENGTHENING EXERCISES  Rehabilitation of the knee is important following a knee injury or an operation. After just a few days of immobilization, the muscles of the thigh which control the knee become weakened and shrink (atrophy). Knee exercises are designed to build up the tone and strength of the thigh muscles and to improve knee motion. Often times heat used for twenty to thirty minutes before working out will loosen up your tissues and help with improving the range of motion but do not use heat for the  first two weeks following surgery. These exercises can be done on a training (exercise) mat, on the floor, on a table or on a bed. Use what ever works the best and is most comfortable for you Knee exercises include:  QUAD STRENGTHENING EXERCISES Strengthening Quadriceps Sets  Tighten muscles on top of thigh by pushing knees down into floor or table. Hold for 20 seconds. Repeat 10 times. Do 2 sessions per day.     Strengthening Terminal Knee Extension  With knee bent over bolster, straighten knee by tightening muscle on top of thigh. Be sure to keep bottom of knee on bolster. Hold for 20 seconds. Repeat 10 times. Do 2 sessions per day.   Straight Leg with Bent Knee  Lie on back with opposite leg bent. Keep involved knee slightly bent at knee and raise leg 4-6". Hold for 10 seconds. Repeat 20 times per set. Do 2 sets per session. Do 2 sessions per day.     Spinal Anesthesia Care After HOME CARE INSTRUCTIONS  Do not drive or operate machinery for at least 24 hours after receiving anesthesia. Make sure someone is available to drive you home.  Do not drink alcohol for at least 24 hours after receiving anesthesia.  Do not make important decisions for 24 hours after having spinal or epidural anesthesia. Your thinking may be unclear.  Have someone stay with you for at least 24 to 48 hours following surgery.  Drink lots of fluids when you get home. If you are an adult, drink eight, 8 ounce glasses of water per day, or as directed.  Keep your post-operative appointments as suggested. SEEK IMMEDIATE MEDICAL CARE IF:   You develop a fever or any temperature over 100.4 F (38 C).  You have a persistent or severe headache.  You develop blurred or double vision.  You develop dizziness, fainting or lightheadedness.  You have weakness, numbness, or tingling in your arms or legs.  You develop a skin rash.  You have difficulty breathing.  You have persistent nausea and  vomiting.  You are unable to pass urine. Document Released: 12/03/2003 Document Revised: 12/05/2011 Document Reviewed: 10/16/2007 [facility] Patient Information 2015 [place], [place]. This information is not intended to replace advice given to you by your health care provider. Make sure you discuss any questions you have with your health care provider.
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
## 2015-05-19 NOTE — Interval H&P Note (Signed)
History and Physical Interval Note:  05/19/2015 2:51 PM  Carla [name]  has presented today for surgery, with the diagnosis of PATELLA CLUNK SYNDROME OF LEFT KNEE  The various methods of treatment have been discussed with the patient and family. After consideration of risks, benefits and other options for treatment, the patient has consented to  Procedure(s): LEFT ARTHROSCOPY KNEE WITH SYNOVECTOMY (Left) as a surgical intervention .  The patient's history has been reviewed, patient examined, no change in status, stable for surgery.  I have reviewed the patient's chart and labs.  Questions were answered to the patient's satisfaction.     Gearlean Alf

## 2015-05-20 ENCOUNTER — Encounter (HOSPITAL_COMMUNITY): Payer: Self-pay | Admitting: Orthopedic Surgery

## 2015-05-20 NOTE — Op Note (Signed)
NAMETYJANAE, BARTEK NO.:  [id number]  MEDICAL RECORD NO.:  [id number]  LOCATION:  [facility]                         FACILITY:  [facility]  PHYSICIAN:  Gaynelle Arabian, M.D.    DATE OF BIRTH:  [birth year]  DATE OF PROCEDURE:  05/19/2015 DATE OF DISCHARGE:  05/19/2015                              OPERATIVE REPORT   PREOPERATIVE DIAGNOSIS:  Patellar clunk syndrome, left knee.  POSTOPERATIVE DIAGNOSIS:  Patellar clunk syndrome, left knee.  PROCEDURE:  Left knee arthroscopy with synovectomy.  SURGEON:  Gaynelle Arabian, M.D.  ASSISTANT:  No assistant.  ANESTHESIA:  Spinal.  ESTIMATED BLOOD LOSS:  Minimal.  DRAINS:  None.  COMPLICATIONS:  None.  CONDITION:  Stable to recovery.  BRIEF CLINICAL NOTE:  Ms. Redmond is an 81 year old female who had a left total knee arthroplasty done approximately 5-1/2 months ago.  She was doing extremely well initially, then noted to have painful popping in the knee consistent with patellar clunk syndrome.  She has had significant pain with this.  She presents now for arthroscopy with synovectomy.  PROCEDURE IN DETAIL:  After successful administration of spinal anesthetic, tourniquet was placed high on the left thigh.  Left lower extremity was prepped and draped in the usual sterile fashion.  Standard superomedial and inferolateral incisions were made.  Inflow cannula was passed, superomedial camera passed inferolateral.  Arthroscopic visualization proceeds.  Undersurface of the patella shows that the patellar component was intact and there was a large amount of hypertrophic synovial tissue at the junction of the quad tendon and patella.  This was consistent with the patellar clunk syndrome.  A superolateral portal was created with an 11-blade and then the shaver and ArthroCare used to remove this tissue back to more normal quad tendon.  There was also hypertrophic tissue in the medial and lateral gutters and that was removed with the  shaver and ArthroCare.  The joint was then inspected and the components were in excellent position.  No abnormalities noted.  The joint was further inspected.  No other abnormal tissue noted.  The arthroscopic equipment was then removed from the lateral portals, which were closed with interrupted 4-0 nylon.  A 20 mL of 0.25% Marcaine with epinephrine was injected through the inflow cannula, that was removed and that portal was closed with nylon.  Incision was cleaned and dried and bulky sterile dressing applied.  She was then awakened and transported to the recovery in stable condition.     Gaynelle Arabian, M.D.     FA/MEDQ  D:  05/19/2015  T:  05/20/2015  Job:  [id number]

## 2015-05-26 DIAGNOSIS — Z4789 Encounter for other orthopedic aftercare: Secondary | ICD-10-CM | POA: Diagnosis not present

## 2015-05-26 DIAGNOSIS — T8482XD Fibrosis due to internal orthopedic prosthetic devices, implants and grafts, subsequent encounter: Secondary | ICD-10-CM | POA: Diagnosis not present

## 2015-05-26 DIAGNOSIS — Z96652 Presence of left artificial knee joint: Secondary | ICD-10-CM | POA: Diagnosis not present

## 2015-06-16 DIAGNOSIS — Z471 Aftercare following joint replacement surgery: Secondary | ICD-10-CM | POA: Diagnosis not present

## 2015-06-16 DIAGNOSIS — Z96652 Presence of left artificial knee joint: Secondary | ICD-10-CM | POA: Diagnosis not present

## 2015-07-14 DIAGNOSIS — Z23 Encounter for immunization: Secondary | ICD-10-CM | POA: Diagnosis not present

## 2015-07-14 DIAGNOSIS — Z4789 Encounter for other orthopedic aftercare: Secondary | ICD-10-CM | POA: Diagnosis not present

## 2015-07-14 DIAGNOSIS — Z96652 Presence of left artificial knee joint: Secondary | ICD-10-CM | POA: Diagnosis not present

## 2015-07-22 DIAGNOSIS — M25662 Stiffness of left knee, not elsewhere classified: Secondary | ICD-10-CM | POA: Diagnosis not present

## 2015-07-22 DIAGNOSIS — R2689 Other abnormalities of gait and mobility: Secondary | ICD-10-CM | POA: Diagnosis not present

## 2015-07-24 DIAGNOSIS — M25662 Stiffness of left knee, not elsewhere classified: Secondary | ICD-10-CM | POA: Diagnosis not present

## 2015-07-24 DIAGNOSIS — R2689 Other abnormalities of gait and mobility: Secondary | ICD-10-CM | POA: Diagnosis not present

## 2015-07-28 DIAGNOSIS — R2689 Other abnormalities of gait and mobility: Secondary | ICD-10-CM | POA: Diagnosis not present

## 2015-07-28 DIAGNOSIS — M25662 Stiffness of left knee, not elsewhere classified: Secondary | ICD-10-CM | POA: Diagnosis not present

## 2015-07-30 DIAGNOSIS — M25662 Stiffness of left knee, not elsewhere classified: Secondary | ICD-10-CM | POA: Diagnosis not present

## 2015-07-30 DIAGNOSIS — R2689 Other abnormalities of gait and mobility: Secondary | ICD-10-CM | POA: Diagnosis not present

## 2015-08-03 DIAGNOSIS — R2689 Other abnormalities of gait and mobility: Secondary | ICD-10-CM | POA: Diagnosis not present

## 2015-08-03 DIAGNOSIS — M25662 Stiffness of left knee, not elsewhere classified: Secondary | ICD-10-CM | POA: Diagnosis not present

## 2015-08-06 DIAGNOSIS — M25662 Stiffness of left knee, not elsewhere classified: Secondary | ICD-10-CM | POA: Diagnosis not present

## 2015-08-06 DIAGNOSIS — R2689 Other abnormalities of gait and mobility: Secondary | ICD-10-CM | POA: Diagnosis not present

## 2015-08-10 DIAGNOSIS — R2689 Other abnormalities of gait and mobility: Secondary | ICD-10-CM | POA: Diagnosis not present

## 2015-08-10 DIAGNOSIS — M25662 Stiffness of left knee, not elsewhere classified: Secondary | ICD-10-CM | POA: Diagnosis not present

## 2015-08-13 DIAGNOSIS — M25662 Stiffness of left knee, not elsewhere classified: Secondary | ICD-10-CM | POA: Diagnosis not present

## 2015-08-13 DIAGNOSIS — R2689 Other abnormalities of gait and mobility: Secondary | ICD-10-CM | POA: Diagnosis not present

## 2015-08-17 DIAGNOSIS — M25662 Stiffness of left knee, not elsewhere classified: Secondary | ICD-10-CM | POA: Diagnosis not present

## 2015-08-17 DIAGNOSIS — R2689 Other abnormalities of gait and mobility: Secondary | ICD-10-CM | POA: Diagnosis not present

## 2015-08-19 DIAGNOSIS — M25662 Stiffness of left knee, not elsewhere classified: Secondary | ICD-10-CM | POA: Diagnosis not present

## 2015-08-19 DIAGNOSIS — R2689 Other abnormalities of gait and mobility: Secondary | ICD-10-CM | POA: Diagnosis not present

## 2015-08-25 DIAGNOSIS — R2689 Other abnormalities of gait and mobility: Secondary | ICD-10-CM | POA: Diagnosis not present

## 2015-08-25 DIAGNOSIS — M25662 Stiffness of left knee, not elsewhere classified: Secondary | ICD-10-CM | POA: Diagnosis not present

## 2015-08-27 DIAGNOSIS — R2689 Other abnormalities of gait and mobility: Secondary | ICD-10-CM | POA: Diagnosis not present

## 2015-08-27 DIAGNOSIS — M25662 Stiffness of left knee, not elsewhere classified: Secondary | ICD-10-CM | POA: Diagnosis not present

## 2015-08-28 ENCOUNTER — Ambulatory Visit (INDEPENDENT_AMBULATORY_CARE_PROVIDER_SITE_OTHER): Payer: Medicare Other | Admitting: Pulmonary Disease

## 2015-08-28 ENCOUNTER — Encounter: Payer: Self-pay | Admitting: Pulmonary Disease

## 2015-08-28 ENCOUNTER — Ambulatory Visit: Payer: Medicare Other | Admitting: Pulmonary Disease

## 2015-08-28 VITALS — BP 125/70 | HR 96 | Ht 61.0 in | Wt 129.0 lb

## 2015-08-28 DIAGNOSIS — J479 Bronchiectasis, uncomplicated: Secondary | ICD-10-CM | POA: Diagnosis not present

## 2015-08-28 DIAGNOSIS — R918 Other nonspecific abnormal finding of lung field: Secondary | ICD-10-CM

## 2015-08-28 MED ORDER — OLODATEROL HCL 2.5 MCG/ACT IN AERS: 2.0000 | INHALATION_SPRAY | Freq: Every day | RESPIRATORY_TRACT | Status: DC

## 2015-08-28 MED ORDER — ALBUTEROL SULFATE HFA 108 (90 BASE) MCG/ACT IN AERS
2.0000 | INHALATION_SPRAY | Freq: Four times a day (QID) | RESPIRATORY_TRACT | Status: DC | PRN
Start: 2015-08-28 — End: 2024-04-08

## 2015-08-28 NOTE — Assessment & Plan Note (Signed)
She has multiple pulmonary nodules which have waxed and waned over 2 separate CT scans. The radiologist feels this is most likely consistent with an atypical mycobacterial process.

## 2015-08-28 NOTE — Patient Instructions (Signed)
Keep taking the Striverdi once a day no matter how you feel Use albuterol every day I will see you back in 6 months or sooner if needed

## 2015-08-28 NOTE — Assessment & Plan Note (Signed)
She has chronic stable bronchiectasis. She produces mucus on a daily basis but she has minimal symptoms. She has done very well with the addition of a long-acting beta agonist. She only had one flare of bronchitis this year which I think is actually pretty good.  Even though her CT scan suggests Mycobacterium avium complex she does not have symptoms to suggest active disease. She may be colonized but we do not have a culture to prove this. From my standpoint, there is no indication for culturing right now unless she has a worsening in symptoms.  Plan: Continue long-acting beta agonist daily Obtain sputum culture if symptoms worsen Follow-up 6 months
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
## 2015-08-31 DIAGNOSIS — M25662 Stiffness of left knee, not elsewhere classified: Secondary | ICD-10-CM | POA: Diagnosis not present

## 2015-08-31 DIAGNOSIS — R2689 Other abnormalities of gait and mobility: Secondary | ICD-10-CM | POA: Diagnosis not present

## 2015-09-02 DIAGNOSIS — R2689 Other abnormalities of gait and mobility: Secondary | ICD-10-CM | POA: Diagnosis not present

## 2015-09-02 DIAGNOSIS — M25662 Stiffness of left knee, not elsewhere classified: Secondary | ICD-10-CM | POA: Diagnosis not present

## 2015-09-03 DIAGNOSIS — Z4789 Encounter for other orthopedic aftercare: Secondary | ICD-10-CM | POA: Diagnosis not present

## 2015-09-03 DIAGNOSIS — Z96652 Presence of left artificial knee joint: Secondary | ICD-10-CM | POA: Diagnosis not present

## 2015-09-03 DIAGNOSIS — Z471 Aftercare following joint replacement surgery: Secondary | ICD-10-CM | POA: Diagnosis not present

## 2015-09-27 HISTORY — PX: BREAST LUMPECTOMY: SHX2

## 2015-10-26 ENCOUNTER — Telehealth: Payer: Self-pay | Admitting: Pulmonary Disease

## 2015-10-26 NOTE — Telephone Encounter (Signed)
Patient assistance forms in BQ's folder

## 2015-10-30 NOTE — Telephone Encounter (Signed)
Carla [name] have these forms been done yet?  Please advise.

## 2015-11-02 NOTE — Telephone Encounter (Signed)
Form received and filled out, placed in BQ's look-at for signature.  Will await signature.

## 2015-11-03 MED ORDER — OLODATEROL HCL 2.5 MCG/ACT IN AERS: 2.0000 | INHALATION_SPRAY | Freq: Every day | RESPIRATORY_TRACT | Status: DC

## 2015-11-03 NOTE — Telephone Encounter (Signed)
Form filled out and faxed to [facility] cares.  lmtcb X1 for [name] to make aware.

## 2015-11-03 NOTE — Telephone Encounter (Signed)
Jan returned call, advised her forms have been faxed.

## 2015-12-11 DIAGNOSIS — M7022 Olecranon bursitis, left elbow: Secondary | ICD-10-CM | POA: Diagnosis not present

## 2015-12-22 DIAGNOSIS — S30861A Insect bite (nonvenomous) of abdominal wall, initial encounter: Secondary | ICD-10-CM | POA: Diagnosis not present

## 2015-12-22 DIAGNOSIS — S60561A Insect bite (nonvenomous) of right hand, initial encounter: Secondary | ICD-10-CM | POA: Diagnosis not present

## 2015-12-25 DIAGNOSIS — I10 Essential (primary) hypertension: Secondary | ICD-10-CM | POA: Diagnosis not present

## 2016-01-01 DIAGNOSIS — Z0000 Encounter for general adult medical examination without abnormal findings: Secondary | ICD-10-CM | POA: Diagnosis not present

## 2016-01-01 DIAGNOSIS — R918 Other nonspecific abnormal finding of lung field: Secondary | ICD-10-CM | POA: Diagnosis not present

## 2016-01-01 DIAGNOSIS — M7030 Other bursitis of elbow, unspecified elbow: Secondary | ICD-10-CM | POA: Diagnosis not present

## 2016-01-01 DIAGNOSIS — Z1389 Encounter for screening for other disorder: Secondary | ICD-10-CM | POA: Diagnosis not present

## 2016-01-01 DIAGNOSIS — F419 Anxiety disorder, unspecified: Secondary | ICD-10-CM | POA: Diagnosis not present

## 2016-01-01 DIAGNOSIS — A312 Disseminated mycobacterium avium-intracellulare complex (DMAC): Secondary | ICD-10-CM | POA: Diagnosis not present

## 2016-01-01 DIAGNOSIS — J479 Bronchiectasis, uncomplicated: Secondary | ICD-10-CM | POA: Diagnosis not present

## 2016-01-01 DIAGNOSIS — R808 Other proteinuria: Secondary | ICD-10-CM | POA: Diagnosis not present

## 2016-01-01 DIAGNOSIS — I10 Essential (primary) hypertension: Secondary | ICD-10-CM | POA: Diagnosis not present

## 2016-01-01 DIAGNOSIS — Z6825 Body mass index (BMI) 25.0-25.9, adult: Secondary | ICD-10-CM | POA: Diagnosis not present

## 2016-01-01 DIAGNOSIS — H269 Unspecified cataract: Secondary | ICD-10-CM | POA: Diagnosis not present

## 2016-01-01 DIAGNOSIS — K219 Gastro-esophageal reflux disease without esophagitis: Secondary | ICD-10-CM | POA: Diagnosis not present

## 2016-02-03 ENCOUNTER — Ambulatory Visit: Payer: Medicare Other | Admitting: Acute Care

## 2016-02-12 ENCOUNTER — Ambulatory Visit (INDEPENDENT_AMBULATORY_CARE_PROVIDER_SITE_OTHER): Payer: Medicare Other | Admitting: Pulmonary Disease

## 2016-02-12 ENCOUNTER — Encounter: Payer: Self-pay | Admitting: Pulmonary Disease

## 2016-02-12 VITALS — BP 102/70 | HR 96 | Ht 61.0 in | Wt 129.6 lb

## 2016-02-12 DIAGNOSIS — A310 Pulmonary mycobacterial infection: Secondary | ICD-10-CM | POA: Diagnosis not present

## 2016-02-12 MED ORDER — ALBUTEROL SULFATE (2.5 MG/3ML) 0.083% IN NEBU: 2.5000 mg | INHALATION_SOLUTION | Freq: Four times a day (QID) | RESPIRATORY_TRACT | Status: DC | PRN

## 2016-02-12 MED ORDER — FLUTTER DEVI: Status: DC

## 2016-02-12 MED ORDER — LEVOFLOXACIN 500 MG PO TABS: 500.0000 mg | ORAL_TABLET | Freq: Every day | ORAL | Status: DC

## 2016-02-12 NOTE — Assessment & Plan Note (Signed)
Increase in Sputum Production and cough. Plan: We will get a sputum culture today.  Add mucinex for secretion clearance. Take with a full glass of water. Flutter Valve : We will instruct you on the use. Delsym Cough Syrup every 12 hours as needed for cough. We will order a nebulizer machine and albuterol . Use this as needed for wheezing and Shortness of breath up to every 6 hours. If you are not better in 5 days, start Levaquin 500 mg once daily  x 7 days. Follow up in 3 months with Dr. Lake Bells. Please contact office for sooner follow up if symptoms do not improve or worsen or seek emergency care
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
## 2016-02-12 NOTE — Patient Instructions (Addendum)
It is nice to see you today We will get a sputum culture today.  Add mucinex for secretion clearance. Take with a full glass of water. Flutter Valve : We will instruct you on the use. Delsym Cough Syrup every 12 hours as needed for cough. We will order a nebulizer machine and albuterol . Use this as needed for wheezing and Shortness of breath up to every 6 hours. If you are not better in 5 days, start Levaquin 500 mg once daily  x 7 days. Follow up in 3 months with Dr. Lake Bells. Please contact office for sooner follow up if symptoms do not improve or worsen or seek emergency care

## 2016-02-15 ENCOUNTER — Other Ambulatory Visit: Payer: Self-pay

## 2016-02-15 MED ORDER — ALBUTEROL SULFATE (2.5 MG/3ML) 0.083% IN NEBU: INHALATION_SOLUTION | RESPIRATORY_TRACT | Status: DC

## 2016-02-17 ENCOUNTER — Other Ambulatory Visit: Payer: Medicare Other

## 2016-02-17 DIAGNOSIS — A310 Pulmonary mycobacterial infection: Secondary | ICD-10-CM

## 2016-02-21 LAB — RESPIRATORY CULTURE OR RESPIRATORY AND SPUTUM CULTURE

## 2016-03-07 ENCOUNTER — Encounter: Payer: Self-pay | Admitting: Pulmonary Disease

## 2016-03-07 DIAGNOSIS — A310 Pulmonary mycobacterial infection: Secondary | ICD-10-CM | POA: Insufficient documentation

## 2016-03-18 LAB — AFB CULTURE WITH SMEAR (NOT AT ARMC)

## 2016-03-18 LAB — FUNGUS CULTURE W SMEAR

## 2016-03-21 DIAGNOSIS — Z1231 Encounter for screening mammogram for malignant neoplasm of breast: Secondary | ICD-10-CM | POA: Diagnosis not present

## 2016-03-30 DIAGNOSIS — N63 Unspecified lump in breast: Secondary | ICD-10-CM | POA: Diagnosis not present

## 2016-03-31 ENCOUNTER — Other Ambulatory Visit: Payer: Self-pay | Admitting: Internal Medicine

## 2016-03-31 DIAGNOSIS — N6310 Unspecified lump in the right breast, unspecified quadrant: Secondary | ICD-10-CM

## 2016-04-05 ENCOUNTER — Ambulatory Visit
Admission: RE | Admit: 2016-04-05 | Discharge: 2016-04-05 | Disposition: A | Discharge status: Home / Self Care | Payer: Medicare Other | Source: Ambulatory Visit | Attending: Internal Medicine | Admitting: Internal Medicine

## 2016-04-05 ENCOUNTER — Other Ambulatory Visit: Payer: Self-pay | Admitting: Internal Medicine

## 2016-04-05 DIAGNOSIS — N63 Unspecified lump in breast: Secondary | ICD-10-CM | POA: Diagnosis not present

## 2016-04-05 DIAGNOSIS — N6310 Unspecified lump in the right breast, unspecified quadrant: Secondary | ICD-10-CM

## 2016-04-05 DIAGNOSIS — C50811 Malignant neoplasm of overlapping sites of right female breast: Secondary | ICD-10-CM | POA: Diagnosis not present

## 2016-04-07 ENCOUNTER — Telehealth: Payer: Self-pay | Admitting: *Deleted

## 2016-04-07 ENCOUNTER — Encounter: Payer: Self-pay | Admitting: *Deleted

## 2016-04-07 DIAGNOSIS — C50411 Malignant neoplasm of upper-outer quadrant of right female breast: Secondary | ICD-10-CM

## 2016-04-07 HISTORY — DX: Malignant neoplasm of upper-outer quadrant of right female breast: C50.411

## 2016-04-07 NOTE — Telephone Encounter (Signed)
Confirmed [facility] for 04/13/16 at 1215 .  Instructions and contact information given.

## 2016-04-07 NOTE — Telephone Encounter (Signed)
Mailed clinic packet to pt.  

## 2016-04-12 DIAGNOSIS — H353132 Nonexudative age-related macular degeneration, bilateral, intermediate dry stage: Secondary | ICD-10-CM | POA: Diagnosis not present

## 2016-04-12 DIAGNOSIS — H524 Presbyopia: Secondary | ICD-10-CM | POA: Diagnosis not present

## 2016-04-12 DIAGNOSIS — H43813 Vitreous degeneration, bilateral: Secondary | ICD-10-CM | POA: Diagnosis not present

## 2016-04-12 DIAGNOSIS — H04123 Dry eye syndrome of bilateral lacrimal glands: Secondary | ICD-10-CM | POA: Diagnosis not present

## 2016-04-13 ENCOUNTER — Other Ambulatory Visit: Payer: Self-pay | Admitting: General Surgery

## 2016-04-13 ENCOUNTER — Other Ambulatory Visit (HOSPITAL_BASED_OUTPATIENT_CLINIC_OR_DEPARTMENT_OTHER): Payer: Medicare Other

## 2016-04-13 ENCOUNTER — Other Ambulatory Visit: Payer: Self-pay | Admitting: *Deleted

## 2016-04-13 ENCOUNTER — Encounter: Payer: Self-pay | Admitting: General Practice

## 2016-04-13 ENCOUNTER — Ambulatory Visit
Admission: RE | Admit: 2016-04-13 | Discharge: 2016-04-13 | Disposition: A | Discharge status: Home / Self Care | Payer: Medicare Other | Source: Ambulatory Visit | Attending: Radiation Oncology | Admitting: Radiation Oncology

## 2016-04-13 ENCOUNTER — Encounter: Payer: Self-pay | Admitting: Hematology and Oncology

## 2016-04-13 ENCOUNTER — Ambulatory Visit: Payer: Medicare Other | Attending: General Surgery | Admitting: Physical Therapy

## 2016-04-13 ENCOUNTER — Ambulatory Visit (HOSPITAL_BASED_OUTPATIENT_CLINIC_OR_DEPARTMENT_OTHER): Payer: Medicare Other | Admitting: Hematology and Oncology

## 2016-04-13 ENCOUNTER — Encounter: Payer: Self-pay | Admitting: Physical Therapy

## 2016-04-13 VITALS — BP 133/66 | HR 87 | Temp 97.4°F | Resp 17 | Ht 61.0 in | Wt 124.9 lb

## 2016-04-13 DIAGNOSIS — Z170 Estrogen receptor positive status [ER+]: Secondary | ICD-10-CM

## 2016-04-13 DIAGNOSIS — C50411 Malignant neoplasm of upper-outer quadrant of right female breast: Secondary | ICD-10-CM

## 2016-04-13 DIAGNOSIS — R293 Abnormal posture: Secondary | ICD-10-CM | POA: Diagnosis not present

## 2016-04-13 LAB — COMPREHENSIVE METABOLIC PANEL
ALT: 12 U/L (ref 0–55)
ANION GAP: 8 meq/L (ref 3–11)
AST: 16 U/L (ref 5–34)
Albumin: 3.7 g/dL (ref 3.5–5.0)
Alkaline Phosphatase: 105 U/L (ref 40–150)
BUN: 14.2 mg/dL (ref 7.0–26.0)
CHLORIDE: 105 meq/L (ref 98–109)
CO2: 28 meq/L (ref 22–29)
CREATININE: 0.8 mg/dL (ref 0.6–1.1)
Calcium: 9.2 mg/dL (ref 8.4–10.4)
EGFR: 69 mL/min/{1.73_m2} — AB (ref >90)
Glucose: 87 mg/dl (ref 70–140)
POTASSIUM: 4.1 meq/L (ref 3.5–5.1)
Sodium: 141 mEq/L (ref 136–145)
Total Bilirubin: 0.51 mg/dL (ref 0.20–1.20)
Total Protein: 6.7 g/dL (ref 6.4–8.3)

## 2016-04-13 LAB — CBC WITH DIFFERENTIAL/PLATELET
BASO%: 0.8 % (ref 0.0–2.0)
Basophils Absolute: 0.1 10*3/uL (ref 0.0–0.1)
EOS%: 2 % (ref 0.0–7.0)
Eosinophils Absolute: 0.2 10*3/uL (ref 0.0–0.5)
HCT: 40 % (ref 34.8–46.6)
HGB: 13.2 g/dL (ref 11.6–15.9)
LYMPH#: 1.8 10*3/uL (ref 0.9–3.3)
LYMPH%: 23.8 % (ref 14.0–49.7)
MCH: 32.4 pg (ref 25.1–34.0)
MCHC: 33.1 g/dL (ref 31.5–36.0)
MCV: 97.9 fL (ref 79.5–101.0)
MONO#: 0.7 10*3/uL (ref 0.1–0.9)
MONO%: 8.7 % (ref 0.0–14.0)
NEUT#: 5 10*3/uL (ref 1.5–6.5)
NEUT%: 64.7 % (ref 38.4–76.8)
PLATELETS: 398 10*3/uL (ref 145–400)
RBC: 4.08 10*6/uL (ref 3.70–5.45)
RDW: 14 % (ref 11.2–14.5)
WBC: 7.8 10*3/uL (ref 3.9–10.3)
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
## 2016-04-13 NOTE — Progress Notes (Signed)
[place] Psychosocial Distress Screening Spiritual Care  Met with Mishelle and her daughter Helene Kelp at [facility] to introduce [facility] team/resources, reviewing distress screen per protocol.  The patient scored a 2 on the Psychosocial Distress Thermometer which indicates mild distress. Also assessed for distress and other psychosocial needs.   ONCBCN DISTRESS SCREENING 04/13/2016  Screening Type Initial Screening  Distress experienced in past week (1-10) 2  Emotional problem type Isolation/feeling alone  Information Concerns Type Lack of info about diagnosis;Lack of info about treatment;Lack of info about complementary therapy choices;Lack of info about maintaining fitness  Physical Problem type Breathing  Referral to support programs Yes  Other Spiritual Care, counseling intern   Ms Hebert and her dtr used opportunity well to share and process grief that they've encountered in the past and how they have coped, made meaning, and healed through those times.  Per pt, information concerns are resolved.  Dtr Teresa's big active stressor is that her husband is in rehab in [place] (40-min drive one way) after having a stroke in May.  Both talked about dementia-related decline and death of pt's husband/dtr's dad, as well as death of son/brother at age 32.  Per pt, "I'm a widow; sometimes I feel blue, but that's just to be expected."  Per pt, she found a bereavement support group very helpful and keeps in touch with members.  She is also finding meaning and comfort in her [employer] and a Wellsite geologist support center called [place].  In particular, she has used her theological image of "God's bouquet" ("God takes people of all ages--in full bloom, and just beginning to bud--to be part of His bouquet.  When the bouquet is completed, we'll hear the trumpets sound [meaning, God will have called everyone home]."  Per pt, sharing this image has enabled her to bring comfort to other  bereaved people.    Follow up needed: No.  Regino Schultze and Helene Kelp are aware of ongoing Support Team availability, have full information packet, and have a good understanding of how Spiritual Care could support them along the way, but please also page if needs arise/circumstances change.  Thank you.  [place], [place], [facility] Pager [phone] Voicemail [phone]
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
## 2016-04-13 NOTE — Patient Instructions (Signed)

## 2016-04-13 NOTE — Assessment & Plan Note (Signed)
Right breast biopsy 12:00:IDC LVI present, grade 2-3, ER 95%, PR 60%, HER-2 negative ratio 1.42, Ki-67 20%, screening detected right breast mass 7 mm axilla negative,T1bN0 stage IA clinical stage  Pathology and radiology counseling: Discussed with the patient, the details of pathology including the type of breast cancer,the clinical staging, the significance of ER, PR and HER-2/neu receptors and the implications for treatment. After reviewing the pathology in detail, we proceeded to discuss the different treatment options between surgery, radiation, and antiestrogen therapies.  Recommendation based a multidisciplinary tumor board: 1. Breast conserving surgery with sentinel lymph node biopsy 2. Adjuvant radiation therapy followed by 3. Optional antiestrogen therapy.  I discussed with her the pros and cons of antiestrogen therapy. Because she has known diagnosis of osteoporosis we could consider treating with tamoxifen. Upon further inquiry she has not had a bone density in a while. We will obtain a bone density test for further evaluation and then make a final decision between anastrozole versus tamoxifen.  Genetics consultation be obtained because she has a daughter with breast cancer at age 64. Return to clinic after surgery to discuss pathology report.

## 2016-04-15 ENCOUNTER — Telehealth: Payer: Self-pay | Admitting: *Deleted

## 2016-04-15 NOTE — Telephone Encounter (Signed)
Call received from Ellery Plunk, patient's daughter.  Request Breast Navigator.  Call transferred to ext [phone].

## 2016-04-16 ENCOUNTER — Telehealth: Payer: Self-pay | Admitting: Hematology and Oncology

## 2016-04-16 NOTE — Telephone Encounter (Signed)
S/w pt, gave appt for 8/1 @ 12p for bone density. Pt wants to know when breast surgery will be. I told pt I didn't have any info to provide about when surgery would be.

## 2016-04-18 ENCOUNTER — Telehealth: Payer: Self-pay | Admitting: *Deleted

## 2016-04-18 NOTE — Telephone Encounter (Signed)
Spoke to pt daughter Helene Kelp) concerning [place] from 04/13/16. Denies questions or concerns regarding dx or treatment care plan. Encourage to call with needs. Received verbal understanding.

## 2016-04-19 ENCOUNTER — Other Ambulatory Visit: Payer: Self-pay | Admitting: General Surgery

## 2016-04-19 DIAGNOSIS — C50411 Malignant neoplasm of upper-outer quadrant of right female breast: Secondary | ICD-10-CM

## 2016-04-20 ENCOUNTER — Other Ambulatory Visit: Payer: Self-pay | Admitting: *Deleted

## 2016-04-22 ENCOUNTER — Telehealth: Payer: Self-pay | Admitting: Hematology and Oncology

## 2016-04-22 NOTE — Telephone Encounter (Signed)
Spoke with pt to confirm appt 8/16 date/time

## 2016-04-26 ENCOUNTER — Ambulatory Visit
Admission: RE | Admit: 2016-04-26 | Discharge: 2016-04-26 | Disposition: A | Discharge status: Home / Self Care | Payer: Medicare Other | Source: Ambulatory Visit | Attending: Hematology and Oncology | Admitting: Hematology and Oncology

## 2016-04-26 DIAGNOSIS — C50411 Malignant neoplasm of upper-outer quadrant of right female breast: Secondary | ICD-10-CM

## 2016-04-26 DIAGNOSIS — Z780 Asymptomatic menopausal state: Secondary | ICD-10-CM | POA: Diagnosis not present

## 2016-04-26 DIAGNOSIS — M85852 Other specified disorders of bone density and structure, left thigh: Secondary | ICD-10-CM | POA: Diagnosis not present

## 2016-04-28 ENCOUNTER — Encounter (HOSPITAL_COMMUNITY)
Admission: RE | Admit: 2016-04-28 | Discharge: 2016-04-28 | Disposition: A | Discharge status: Home / Self Care | Payer: Medicare Other | Source: Ambulatory Visit | Attending: General Surgery | Admitting: General Surgery

## 2016-04-28 ENCOUNTER — Encounter (HOSPITAL_COMMUNITY): Payer: Self-pay

## 2016-04-28 DIAGNOSIS — Z01812 Encounter for preprocedural laboratory examination: Secondary | ICD-10-CM | POA: Insufficient documentation

## 2016-04-28 DIAGNOSIS — C50911 Malignant neoplasm of unspecified site of right female breast: Secondary | ICD-10-CM | POA: Diagnosis not present

## 2016-04-28 HISTORY — DX: Family history of other specified conditions: Z84.89

## 2016-04-28 LAB — CBC
HCT: 41.1 % (ref 36.0–46.0)
Hemoglobin: 13.1 g/dL (ref 12.0–15.0)
MCH: 32.3 pg (ref 26.0–34.0)
MCHC: 31.9 g/dL (ref 30.0–36.0)
MCV: 101.5 fL — ABNORMAL HIGH (ref 78.0–100.0)
PLATELETS: 367 10*3/uL (ref 150–400)
RBC: 4.05 MIL/uL (ref 3.87–5.11)
RDW: 13.8 % (ref 11.5–15.5)
WBC: 9 10*3/uL (ref 4.0–10.5)

## 2016-04-28 LAB — BASIC METABOLIC PANEL
ANION GAP: 7 (ref 5–15)
BUN: 13 mg/dL (ref 6–20)
CO2: 28 mmol/L (ref 22–32)
CREATININE: 0.76 mg/dL (ref 0.44–1.00)
Calcium: 9.2 mg/dL (ref 8.9–10.3)
Chloride: 106 mmol/L (ref 101–111)
GFR calc Af Amer: >60 mL/min (ref >60)
Glucose, Bld: 111 mg/dL — ABNORMAL HIGH (ref 65–99)
Potassium: 3.6 mmol/L (ref 3.5–5.1)
Sodium: 141 mmol/L (ref 135–145)

## 2016-04-28 NOTE — Pre-Procedure Instructions (Signed)
    [facility]la [name]  04/28/2016      [address], [place] [address] Phone: [phone] Fax: [phone]  [facility] [place], [place] [address] Phone: [phone] Fax: [phone]    Your procedure is scheduled on Tues. Aug 8  Report to [facility] Admitting at 5:30A.M.  Call this number if you have problems the morning of surgery:  [phone]   Remember:  Do not eat food or drink liquids after midnight.  Take these medicines the morning of surgery with A SIP OF WATER : zyrtec, valium if needed, albuterol inhaler if needed-bring to hospital, flonase if needed, olodaterol( striverdi) inhaler , omeprazole (prilosec)             Stop vitamins/herbal medicines,NDAIDS : aspirin, advil, motrin, ibuprofen, BC, goody's.   Do not wear jewelry, make-up or nail polish.  Do not wear lotions, powders, or perfumes.  You may not wear deoderant.  Do not shave 48 hours prior to surgery.  Men may shave face and neck.  Do not bring valuables to the hospital.  [facility] is not responsible for any belongings or valuables.  Contacts, dentures or bridgework may not be worn into surgery.  Leave your suitcase in the car.  After surgery it may be brought to your room.  For patients admitted to the hospital, discharge time will be determined by your treatment team.  Patients discharged the day of surgery will not be allowed to drive home.   Name and phone number of your driver:    Special instructions:  Review preparing for surgery handout  Please read over the following fact sheets that you were given. Coughing and Deep Breathing

## 2016-04-28 NOTE — Progress Notes (Signed)
PCP: Dr. Jose Persia @ [facility] Medical Pulmonary: Dr. Lake Bells @ [place]  Will request ekg/notes from PCP.

## 2016-04-29 ENCOUNTER — Other Ambulatory Visit (HOSPITAL_COMMUNITY): Payer: Self-pay

## 2016-05-02 ENCOUNTER — Ambulatory Visit
Admission: RE | Admit: 2016-05-02 | Discharge: 2016-05-02 | Disposition: A | Discharge status: Home / Self Care | Payer: Medicare Other | Source: Ambulatory Visit | Attending: General Surgery | Admitting: General Surgery

## 2016-05-02 DIAGNOSIS — C50411 Malignant neoplasm of upper-outer quadrant of right female breast: Secondary | ICD-10-CM

## 2016-05-02 DIAGNOSIS — C50911 Malignant neoplasm of unspecified site of right female breast: Secondary | ICD-10-CM | POA: Diagnosis not present

## 2016-05-02 NOTE — Anesthesia Preprocedure Evaluation (Addendum)
Anesthesia Evaluation  Patient identified by MRN, date of birth, ID band Patient awake    Reviewed: Allergy & Precautions, H&P , NPO status , Patient's Chart, lab work & pertinent test results, reviewed documented beta blocker date and time   Airway Mallampati: II  TM Distance: >3 FB Neck ROM: Full    Dental no notable dental hx. (+) Dental Advisory Given, Teeth Intact   Pulmonary shortness of breath, asthma ,    Pulmonary exam normal breath sounds clear to auscultation       Cardiovascular hypertension, Pt. on medications Normal cardiovascular exam Rhythm:regular Rate:Normal     Neuro/Psych negative neurological ROS  negative psych ROS   GI/Hepatic negative GI ROS, Neg liver ROS, GERD  Medicated and Controlled,  Endo/Other  negative endocrine ROS  Renal/GU negative Renal ROS  negative genitourinary   Musculoskeletal   Abdominal   Peds  Hematology negative hematology ROS (+)   Anesthesia Other Findings Breast cancer  Reproductive/Obstetrics negative OB ROS                            Anesthesia Physical Anesthesia Plan  ASA: II  Anesthesia Plan: General   Post-op Pain Management:    Induction: Intravenous  Airway Management Planned: LMA  Additional Equipment:   Intra-op Plan:   Post-operative Plan:   Informed Consent: I have reviewed the patients History and Physical, chart, labs and discussed the procedure including the risks, benefits and alternatives for the proposed anesthesia with the patient or authorized representative who has indicated his/her understanding and acceptance.   Dental Advisory Given  Plan Discussed with: CRNA  Anesthesia Plan Comments:        Anesthesia Quick Evaluation

## 2016-05-03 ENCOUNTER — Encounter (HOSPITAL_COMMUNITY)
Admission: RE | Disposition: A | Discharge status: Home / Self Care | Payer: Self-pay | Source: Ambulatory Visit | Attending: General Surgery

## 2016-05-03 ENCOUNTER — Ambulatory Visit (HOSPITAL_COMMUNITY)
Admission: RE | Admit: 2016-05-03 | Discharge: 2016-05-03 | Disposition: A | Discharge status: Home / Self Care | Payer: Medicare Other | Source: Ambulatory Visit | Attending: General Surgery | Admitting: General Surgery

## 2016-05-03 ENCOUNTER — Encounter (HOSPITAL_COMMUNITY): Payer: Self-pay | Admitting: *Deleted

## 2016-05-03 ENCOUNTER — Ambulatory Visit (HOSPITAL_COMMUNITY): Payer: Medicare Other | Admitting: Anesthesiology

## 2016-05-03 ENCOUNTER — Ambulatory Visit
Admission: RE | Admit: 2016-05-03 | Discharge: 2016-05-03 | Disposition: A | Discharge status: Home / Self Care | Payer: Medicare Other | Source: Ambulatory Visit | Attending: General Surgery | Admitting: General Surgery

## 2016-05-03 DIAGNOSIS — C50411 Malignant neoplasm of upper-outer quadrant of right female breast: Secondary | ICD-10-CM | POA: Insufficient documentation

## 2016-05-03 DIAGNOSIS — Z170 Estrogen receptor positive status [ER+]: Secondary | ICD-10-CM | POA: Diagnosis not present

## 2016-05-03 DIAGNOSIS — K219 Gastro-esophageal reflux disease without esophagitis: Secondary | ICD-10-CM | POA: Insufficient documentation

## 2016-05-03 DIAGNOSIS — J45909 Unspecified asthma, uncomplicated: Secondary | ICD-10-CM | POA: Diagnosis not present

## 2016-05-03 DIAGNOSIS — Z7982 Long term (current) use of aspirin: Secondary | ICD-10-CM | POA: Diagnosis not present

## 2016-05-03 DIAGNOSIS — Z79890 Hormone replacement therapy (postmenopausal): Secondary | ICD-10-CM | POA: Diagnosis not present

## 2016-05-03 DIAGNOSIS — I10 Essential (primary) hypertension: Secondary | ICD-10-CM | POA: Diagnosis not present

## 2016-05-03 DIAGNOSIS — G8918 Other acute postprocedural pain: Secondary | ICD-10-CM | POA: Diagnosis not present

## 2016-05-03 DIAGNOSIS — C50911 Malignant neoplasm of unspecified site of right female breast: Secondary | ICD-10-CM | POA: Diagnosis not present

## 2016-05-03 DIAGNOSIS — Z79899 Other long term (current) drug therapy: Secondary | ICD-10-CM | POA: Diagnosis not present

## 2016-05-03 HISTORY — PX: BREAST LUMPECTOMY WITH RADIOACTIVE SEED AND SENTINEL LYMPH NODE BIOPSY: SHX6550

## 2016-05-03 SURGERY — BREAST LUMPECTOMY WITH RADIOACTIVE SEED AND SENTINEL LYMPH NODE BIOPSY
Anesthesia: General | Site: Breast | Laterality: Right

## 2016-05-03 MED ORDER — TECHNETIUM TC 99M SULFUR COLLOID FILTERED
1.0000 | Freq: Once | INTRAVENOUS | Status: AC | PRN
  Administered 2016-05-03: 1 via INTRADERMAL

## 2016-05-03 MED ORDER — CHLORHEXIDINE GLUCONATE CLOTH 2 % EX PADS: 6.0000 | MEDICATED_PAD | Freq: Once | CUTANEOUS | Status: DC

## 2016-05-03 MED ORDER — HYDROCODONE-ACETAMINOPHEN 5-325 MG PO TABS: 1.0000 | ORAL_TABLET | Freq: Four times a day (QID) | ORAL | 0 refills | Status: DC | PRN

## 2016-05-03 MED ORDER — FENTANYL CITRATE (PF) 250 MCG/5ML IJ SOLN
INTRAMUSCULAR | Status: AC
  Filled 2016-05-03: qty 5

## 2016-05-03 MED ORDER — DEXAMETHASONE SODIUM PHOSPHATE 10 MG/ML IJ SOLN
INTRAMUSCULAR | Status: AC
  Filled 2016-05-03: qty 1

## 2016-05-03 MED ORDER — ONDANSETRON HCL 4 MG/2ML IJ SOLN
INTRAMUSCULAR | Status: AC
  Filled 2016-05-03: qty 2

## 2016-05-03 MED ORDER — DEXAMETHASONE SODIUM PHOSPHATE 10 MG/ML IJ SOLN
INTRAMUSCULAR | Status: DC | PRN
  Administered 2016-05-03: 5 mg via INTRAVENOUS

## 2016-05-03 MED ORDER — LIDOCAINE HCL (CARDIAC) 20 MG/ML IV SOLN
INTRAVENOUS | Status: DC | PRN
  Administered 2016-05-03: 50 mg via INTRAVENOUS

## 2016-05-03 MED ORDER — BUPIVACAINE-EPINEPHRINE (PF) 0.25% -1:200000 IJ SOLN
INTRAMUSCULAR | Status: AC
  Filled 2016-05-03: qty 30

## 2016-05-03 MED ORDER — CEFAZOLIN SODIUM-DEXTROSE 2-4 GM/100ML-% IV SOLN
2.0000 g | INTRAVENOUS | Status: AC
  Administered 2016-05-03: 2 g via INTRAVENOUS
  Filled 2016-05-03: qty 100

## 2016-05-03 MED ORDER — FENTANYL CITRATE (PF) 100 MCG/2ML IJ SOLN
INTRAMUSCULAR | Status: DC | PRN
  Administered 2016-05-03: 100 ug via INTRAVENOUS

## 2016-05-03 MED ORDER — GLYCOPYRROLATE 0.2 MG/ML IV SOSY
PREFILLED_SYRINGE | INTRAVENOUS | Status: AC
  Filled 2016-05-03: qty 3

## 2016-05-03 MED ORDER — ONDANSETRON HCL 4 MG/2ML IJ SOLN
INTRAMUSCULAR | Status: DC | PRN
  Administered 2016-05-03: 4 mg via INTRAVENOUS

## 2016-05-03 MED ORDER — PROPOFOL 10 MG/ML IV BOLUS
INTRAVENOUS | Status: DC | PRN
  Administered 2016-05-03: 100 mg via INTRAVENOUS

## 2016-05-03 MED ORDER — DIPHENHYDRAMINE HCL 50 MG/ML IJ SOLN
INTRAMUSCULAR | Status: DC | PRN
  Administered 2016-05-03: 12.5 mg via INTRAVENOUS

## 2016-05-03 MED ORDER — GLYCOPYRROLATE 0.2 MG/ML IJ SOLN
INTRAMUSCULAR | Status: DC | PRN
  Administered 2016-05-03 (×2): 0.1 mg via INTRAVENOUS

## 2016-05-03 MED ORDER — BUPIVACAINE-EPINEPHRINE 0.25% -1:200000 IJ SOLN
INTRAMUSCULAR | Status: DC | PRN
Start: 2016-05-03 — End: 2016-05-03
  Administered 2016-05-03: 16 mL

## 2016-05-03 MED ORDER — METHYLENE BLUE 0.5 % INJ SOLN
INTRAVENOUS | Status: AC
  Filled 2016-05-03: qty 10

## 2016-05-03 MED ORDER — SODIUM CHLORIDE 0.9 % IJ SOLN
INTRAMUSCULAR | Status: AC
  Filled 2016-05-03: qty 10

## 2016-05-03 MED ORDER — PROPOFOL 10 MG/ML IV BOLUS
INTRAVENOUS | Status: AC
  Filled 2016-05-03: qty 20

## 2016-05-03 MED ORDER — LACTATED RINGERS IV SOLN
INTRAVENOUS | Status: DC | PRN
  Administered 2016-05-03: 07:00:00 via INTRAVENOUS

## 2016-05-03 MED ORDER — BUPIVACAINE-EPINEPHRINE (PF) 0.5% -1:200000 IJ SOLN
INTRAMUSCULAR | Status: DC | PRN
  Administered 2016-05-03: 30 mL

## 2016-05-03 MED ORDER — 0.9 % SODIUM CHLORIDE (POUR BTL) OPTIME
TOPICAL | Status: DC | PRN
  Administered 2016-05-03: 1000 mL

## 2016-05-03 MED ORDER — PHENYLEPHRINE HCL 10 MG/ML IJ SOLN
INTRAVENOUS | Status: DC | PRN
  Administered 2016-05-03: 15 ug/min via INTRAVENOUS

## 2016-05-03 MED ORDER — HYDROCODONE-ACETAMINOPHEN 5-325 MG PO TABS: 1.0000 | ORAL_TABLET | ORAL | 0 refills | Status: DC | PRN

## 2016-05-03 MED ORDER — ARTIFICIAL TEARS OP OINT
TOPICAL_OINTMENT | OPHTHALMIC | Status: AC
  Filled 2016-05-03: qty 3.5

## 2016-05-03 SURGICAL SUPPLY — 46 items
APPLIER CLIP 9.375 MED OPEN (MISCELLANEOUS) ×3
APR CLP MED 9.3 20 MLT OPN (MISCELLANEOUS) ×1
BINDER BREAST LRG (GAUZE/BANDAGES/DRESSINGS) IMPLANT
BINDER BREAST XLRG (GAUZE/BANDAGES/DRESSINGS) IMPLANT
BLADE SURG 15 STRL LF DISP TIS (BLADE) ×1 IMPLANT
BLADE SURG 15 STRL SS (BLADE) ×3
CANISTER SUCTION 2500CC (MISCELLANEOUS) ×3 IMPLANT
CHLORAPREP W/TINT 26ML (MISCELLANEOUS) ×3 IMPLANT
CLIP APPLIE 9.375 MED OPEN (MISCELLANEOUS) ×1 IMPLANT
CONT SPEC 4OZ CLIKSEAL STRL BL (MISCELLANEOUS) ×8 IMPLANT
COVER PROBE W GEL 5X96 (DRAPES) ×3 IMPLANT
COVER SURGICAL LIGHT HANDLE (MISCELLANEOUS) ×3 IMPLANT
DRAPE CHEST BREAST 15X10 FENES (DRAPES) ×3 IMPLANT
DRAPE UTILITY XL STRL (DRAPES) ×4 IMPLANT
ELECT CAUTERY BLADE 6.4 (BLADE) ×3 IMPLANT
ELECT COATED BLADE 2.86 ST (ELECTRODE) ×2 IMPLANT
ELECT REM PT RETURN 9FT ADLT (ELECTROSURGICAL) ×3
ELECTRODE REM PT RTRN 9FT ADLT (ELECTROSURGICAL) ×1 IMPLANT
GLOVE BIO SURGEON STRL SZ7.5 (GLOVE) ×3 IMPLANT
GLOVE BIOGEL PI IND STRL 7.0 (GLOVE) IMPLANT
GLOVE BIOGEL PI INDICATOR 7.0 (GLOVE) ×2
GOWN STRL REUS W/ TWL LRG LVL3 (GOWN DISPOSABLE) ×2 IMPLANT
GOWN STRL REUS W/TWL LRG LVL3 (GOWN DISPOSABLE) ×6
KIT BASIN OR (CUSTOM PROCEDURE TRAY) ×3 IMPLANT
KIT MARKER MARGIN INK (KITS) ×3 IMPLANT
LIQUID BAND (GAUZE/BANDAGES/DRESSINGS) ×3 IMPLANT
NDL FILTER BLUNT 18X1 1/2 (NEEDLE) IMPLANT
NDL HYPO 25X1 1.5 SAFETY (NEEDLE) ×1 IMPLANT
NDL SAFETY ECLIPSE 18X1.5 (NEEDLE) IMPLANT
NEEDLE FILTER BLUNT 18X 1/2SAF (NEEDLE)
NEEDLE FILTER BLUNT 18X1 1/2 (NEEDLE) IMPLANT
NEEDLE HYPO 18GX1.5 SHARP (NEEDLE)
NEEDLE HYPO 25X1 1.5 SAFETY (NEEDLE) ×3 IMPLANT
NS IRRIG 1000ML POUR BTL (IV SOLUTION) ×3 IMPLANT
PACK SURGICAL SETUP 50X90 (CUSTOM PROCEDURE TRAY) ×3 IMPLANT
PENCIL BUTTON HOLSTER BLD 10FT (ELECTRODE) ×3 IMPLANT
SPONGE LAP 18X18 X RAY DECT (DISPOSABLE) ×5 IMPLANT
SUT MNCRL AB 4-0 PS2 18 (SUTURE) ×5 IMPLANT
SUT VIC AB 3-0 SH 18 (SUTURE) ×3 IMPLANT
SYR BULB 3OZ (MISCELLANEOUS) ×3 IMPLANT
SYR CONTROL 10ML LL (SYRINGE) ×3 IMPLANT
TOWEL OR 17X24 6PK STRL BLUE (TOWEL DISPOSABLE) ×1 IMPLANT
TOWEL OR 17X26 10 PK STRL BLUE (TOWEL DISPOSABLE) ×3 IMPLANT
TUBE CONNECTING 12'X1/4 (SUCTIONS) ×1
TUBE CONNECTING 12X1/4 (SUCTIONS) ×2 IMPLANT
YANKAUER SUCT BULB TIP NO VENT (SUCTIONS) ×3 IMPLANT

## 2016-05-03 NOTE — Anesthesia Procedure Notes (Signed)
Procedure Name: LMA Insertion Date/Time: 05/03/2016 7:40 AM Performed by: [place] Pink Pre-anesthesia Checklist: Patient identified, Emergency Drugs available, Suction available, Patient being monitored and Timeout performed Patient Re-evaluated:Patient Re-evaluated prior to inductionOxygen Delivery Method: Circle system utilized Preoxygenation: Pre-oxygenation with 100% oxygen Intubation Type: IV induction LMA: LMA inserted LMA Size: 4.0 Number of attempts: 1 Placement Confirmation: positive ETCO2 and breath sounds checked- equal and bilateral Tube secured with: Tape Dental Injury: Teeth and Oropharynx as per pre-operative assessment

## 2016-05-03 NOTE — Interval H&P Note (Signed)
History and Physical Interval Note:  05/03/2016 7:09 AM  [name]  has presented today for surgery, with the diagnosis of RIGHT BREAST CANCER  The various methods of treatment have been discussed with the patient and family. After consideration of risks, benefits and other options for treatment, the patient has consented to  Procedure(s): BREAST LUMPECTOMY WITH RADIOACTIVE SEED AND SENTINEL LYMPH NODE BIOPSY (Right) as a surgical intervention .  The patient's history has been reviewed, patient examined, no change in status, stable for surgery.  I have reviewed the patient's chart and labs.  Questions were answered to the patient's satisfaction.     TOTH III,PAUL S

## 2016-05-03 NOTE — Anesthesia Postprocedure Evaluation (Signed)
Anesthesia Post Note  Patient: [facility]la [name]  Procedure(s) Performed: Procedure(s) (LRB): RADIOACTIVE SEED GUIDED RIGHT BREAST LUMPECTOMY WITH RIGHT AXILLARY SENTINEL LYMPH NODE BIOPSY (Right)  Patient location during evaluation: PACU Anesthesia Type: General Level of consciousness: awake and alert Pain management: pain level controlled Vital Signs Assessment: post-procedure vital signs reviewed and stable Respiratory status: spontaneous breathing, nonlabored ventilation, respiratory function stable and patient connected to nasal cannula oxygen Cardiovascular status: blood pressure returned to baseline and stable Postop Assessment: no signs of nausea or vomiting Anesthetic complications: no    Last Vitals:  Vitals:   05/03/16 0932 05/03/16 0949  BP: 121/70 (!) 153/65  Pulse:  80  Resp:  16  Temp:      Last Pain:  Vitals:   05/03/16 0949  TempSrc:   PainSc: 0-No pain                 Raman Featherston L

## 2016-05-03 NOTE — H&P (Signed)
Carla [name]. [name]  Location: [facility] Surgery Patient #: [id number] DOB: 03/22/1926 Undefined / Language: [name] / Race: White Female   History of Present Illness  The patient is a 82 year old female who presents with breast cancer. We are asked to see the patient in consultation by Dr. Sondra Come to evaluate her for a new right breast cancer. The patient is an 82 year old white female who recently underwent a screening mammogram that showed a mass in the 12 o clock position of the right breast. It measured 86m by u/s. It was biopsied and came back as an invasive breast cancer. It was ER and PR positive and Her2 negative with a Ki67 of 20%. There was LVI but axilla looked negative. She does take hormone replacement.   Other Problems Arthritis Back Pain Gastric Ulcer Gastroesophageal Reflux Disease Hemorrhoids  Past Surgical History  Breast Biopsy Right. Hysterectomy (not due to cancer) - Complete  Diagnostic Studies History  Colonoscopy 1-5 years ago Mammogram within last year Pap Smear >5 years ago  Medication History  Medications Reconciled  Social History  Alcohol use Remotely quit alcohol use. Caffeine use Coffee, Tea. No drug use Tobacco use Never smoker.  Family History Arthritis Mother. Breast Cancer Daughter, Sister. Heart Disease Father.  Pregnancy / Birth History Age at menarche 89years. Age of menopause <47 Contraceptive History Oral contraceptives. Gravida 3 Maternal age 80-20Para 3    Review of Systems  General Not Present- Appetite Loss, Chills, Fatigue, Fever, Night Sweats, Weight Gain and Weight Loss. Skin Not Present- Change in Wart/Mole, Dryness, Hives, Jaundice, New Lesions, Non-Healing Wounds, Rash and Ulcer. HEENT Present- Hearing Loss, Hoarseness and Seasonal Allergies. Not Present- Earache, Nose Bleed, Oral Ulcers, Ringing in the Ears, Sinus Pain, Sore Throat, Visual Disturbances, Wears glasses/contact lenses and  Yellow Eyes. Respiratory Present- Wheezing. Not Present- Bloody sputum, Chronic Cough, Difficulty Breathing and Snoring. Breast Not Present- Breast Mass, Breast Pain, Nipple Discharge and Skin Changes. Cardiovascular Present- Shortness of Breath. Not Present- Chest Pain, Difficulty Breathing Lying Down, Leg Cramps, Palpitations, Rapid Heart Rate and Swelling of Extremities. Gastrointestinal Present- Excessive gas. Not Present- Abdominal Pain, Bloating, Bloody Stool, Change in Bowel Habits, Chronic diarrhea, Constipation, Difficulty Swallowing, Gets full quickly at meals, Hemorrhoids, Indigestion, Nausea, Rectal Pain and Vomiting. Female Genitourinary Not Present- Frequency, Nocturia, Painful Urination, Pelvic Pain and Urgency. Musculoskeletal Present- Joint Pain and Joint Stiffness. Not Present- Back Pain, Muscle Pain, Muscle Weakness and Swelling of Extremities. Neurological Not Present- Decreased Memory, Fainting, Headaches, Numbness, Seizures, Tingling, Tremor, Trouble walking and Weakness. Psychiatric Not Present- Anxiety, Bipolar, Change in Sleep Pattern, Depression, Fearful and Frequent crying. Endocrine Not Present- Cold Intolerance, Excessive Hunger, Hair Changes, Heat Intolerance, Hot flashes and New Diabetes. Hematology Present- Blood Thinners. Not Present- Easy Bruising, Excessive bleeding, Gland problems, HIV and Persistent Infections.   Physical Exam General Mental Status-Alert. General Appearance-Consistent with stated age. Hydration-Well hydrated. Voice-Normal.  Head and Neck Head-normocephalic, atraumatic with no lesions or palpable masses. Trachea-midline. Thyroid Gland Characteristics - normal size and consistency.  Eye Eyeball - Bilateral-Extraocular movements intact. Sclera/Conjunctiva - Bilateral-No scleral icterus.  Chest and Lung Exam Chest and lung exam reveals -quiet, even and easy respiratory effort with no use of accessory muscles and on  auscultation, normal breath sounds, no adventitious sounds and normal vocal resonance. Inspection Chest Wall - Normal. Back - normal.  Breast Note: There is no palpable mass in either breast. There is no palpable axillary, supraclavicular, or cervical lymphadenopathy   Cardiovascular  Cardiovascular examination reveals -normal heart sounds, regular rate and rhythm with no murmurs and normal pedal pulses bilaterally.  Abdomen Inspection Inspection of the abdomen reveals - No Hernias. Skin - Scar - no surgical scars. Palpation/Percussion Palpation and Percussion of the abdomen reveal - Soft, Non Tender, No Rebound tenderness, No Rigidity (guarding) and No hepatosplenomegaly. Auscultation Auscultation of the abdomen reveals - Bowel sounds normal.  Neurologic Neurologic evaluation reveals -alert and oriented x 3 with no impairment of recent or remote memory. Mental Status-Normal.  Musculoskeletal Normal Exam - Left-Upper Extremity Strength Normal and Lower Extremity Strength Normal. Normal Exam - Right-Upper Extremity Strength Normal and Lower Extremity Strength Normal.  Lymphatic Head & Neck  General Head & Neck Lymphatics: Bilateral - Description - Normal. Axillary  General Axillary Region: Bilateral - Description - Normal. Tenderness - Non Tender. Femoral & Inguinal  Generalized Femoral & Inguinal Lymphatics: Bilateral - Description - Normal. Tenderness - Non Tender.    Assessment & Plan  BREAST CANCER OF UPPER-OUTER QUADRANT OF RIGHT FEMALE BREAST (C50.411) Impression: The patient appears to have a small stage I cancer in the upper portion of the right breast. I have talked to her in detail about the different optiions for treatment and at this point she favors breast conservation. She is also a good candidate for sentinel node mapping. I have discussed with her in detail the risks and benefits of the surgery as well as some of the technical aspects and she  understands and wishes to proceed. I will plan for a right breast radioactive seed localized lumpectomy and sentinel node mapping. Current Plans Referred to Oncology, for evaluation and follow up (Oncology). Routine.

## 2016-05-03 NOTE — Op Note (Signed)
05/03/2016  8:55 AM  PATIENT:  Carla [name]  82 y.o. female  PRE-OPERATIVE DIAGNOSIS:  RIGHT BREAST CANCER  POST-OPERATIVE DIAGNOSIS:  RIGHT BREAST CANCER  PROCEDURE:  Procedure(s): RADIOACTIVE SEED GUIDED RIGHT BREAST LUMPECTOMY WITH RIGHT AXILLARY SENTINEL LYMPH NODE BIOPSY (Right)  SURGEON:  Surgeon(s) and Role:    * Jovita Kussmaul, MD - Primary  PHYSICIAN ASSISTANT:   ASSISTANTS: none   ANESTHESIA:   general  EBL:  No intake/output data recorded.  BLOOD ADMINISTERED:none  DRAINS: none   LOCAL MEDICATIONS USED:  MARCAINE     SPECIMEN:  Source of Specimen:  right breast tissue and sentinel nodes X 3  DISPOSITION OF SPECIMEN:  PATHOLOGY  COUNTS:  YES  TOURNIQUET:  * No tourniquets in log *  DICTATION: .Dragon Dictation   After informed consent was obtained the patient was brought to the operating room and placed in the supine position on the operating room table. After adequate induction of general anesthesia the patient's right chest, breast, and axillary area were prepped with ChloraPrep, allowed to dry, and draped in usual sterile manner. An appropriate timeout was performed. Earlier in the day the patient underwent injection of 1 mCi of technetium sulfur colloid in the subareolar position on the right. Previously an I-125 seed was placed in the upper portion of the right breast mark an area of invasive breast cancer. The neoprobe was initially sent to technetium in the right axilla was examined. A hotspot was identified. A small transversely oriented incision was made overlying the hot spot with a 15 blade knife. The incision was carried through the skin and subcutaneous tissue sharply with electrocautery until the axilla was entered. The neoprobe was then used to direct blunt hemostat dissection. I was able to identify 3 hot lymph nodes. These lymph nodes were excised sharply with the electrocautery and the lymphatics were controlled with clips. Ex vivo counts on these 3  sentinel nodes ranged from [phone]. These were sent to pathology as sentinel nodes numbers 1 through 3. There were no other hot or palpable lymph nodes identified in the right axilla. The area was infiltrated with quarter percent Marcaine. The deep layer of the wound was closed with interrupted 3-0 Vicryl stitches. The skin was then closed with a running 4-0 Monocryl subcuticular stitch. Attention was then turned to the right breast. The neoprobe was sent to I-125. The area of radioactivity was readily identified in the upper portion of the right breast. An elliptical transversely oriented incision was made overlying the area of radioactivity with a 15 blade knife. The incision was carried through the skin and subcutaneous tissue sharply with the electrocautery. While checking the area of radioactivity frequently a circular portion of breast tissue was excised sharply around the radioactive seed. The dissection was carried all the way to the chest wall. Once the specimen was removed it was oriented with the appropriate paint colors. A specimen radiograph showed the clip in seed to be in the center of the specimen. The specimen was then sent to pathology for further evaluation. The wound was then irrigated with saline and infiltrated with quarter percent Marcaine. The cavity was marked with clips. The deep layer of the wound was closed with layers of interrupted 3-0 Vicryl stitches. Skin was then closed with interrupted 4-0 Monocryl subcuticular stitches. Dermabond dressings were applied. The patient tolerated the procedure well. At the end of the case all needle sponge and instrument counts were correct. The patient was then awakened and taken to  recovery in stable condition.  PLAN OF CARE: Discharge to home after PACU  PATIENT DISPOSITION:  PACU - hemodynamically stable.   Delay start of Pharmacological VTE agent (>24hrs) due to surgical blood loss or risk of bleeding: not applicable

## 2016-05-03 NOTE — Anesthesia Procedure Notes (Addendum)
Anesthesia Regional Block:  Pectoralis block  Pre-Anesthetic Checklist: ,, timeout performed, Correct Patient, Correct Site, Correct Laterality, Correct Procedure, Correct Position, site marked, Risks and benefits discussed,  Surgical consent,  Pre-op evaluation,  At surgeon's request and post-op pain management  Laterality: Right  Prep: chloraprep       Needles:  Injection technique: Single-shot  Needle Type: Echogenic Needle     Needle Length: 9cm 9 cm Needle Gauge: 21 and 21 G    Additional Needles:  Procedures: ultrasound guided (picture in chart) Pectoralis block Narrative:  Start time: 05/03/2016 7:00 AM End time: 05/03/2016 7:10 AM Injection made incrementally with aspirations every 5 mL.  Performed by: Personally  Anesthesiologist: Rod Mae

## 2016-05-03 NOTE — Transfer of Care (Signed)
Immediate Anesthesia Transfer of Care Note  Patient: Carla [name]  Procedure(s) Performed: Procedure(s): RADIOACTIVE SEED GUIDED RIGHT BREAST LUMPECTOMY WITH RIGHT AXILLARY SENTINEL LYMPH NODE BIOPSY (Right)  Patient Location: PACU  Anesthesia Type:GA combined with regional for post-op pain  Level of Consciousness: awake, alert  and oriented  Airway & Oxygen Therapy: Patient Spontanous Breathing and Patient connected to nasal cannula oxygen  Post-op Assessment: Report given to RN and Post -op Vital signs reviewed and stable  Post vital signs: Reviewed and stable  Last Vitals:  Vitals:   05/03/16 0616 05/03/16 0908  BP: (!) 137/50 135/69  Pulse: 74 80  Resp: 20   Temp: 36.5 C 36.4 C    Last Pain:  Vitals:   05/03/16 0616  TempSrc: Oral      Patients Stated Pain Goal: 4 ([id number])  Complications: No apparent anesthesia complications

## 2016-05-04 ENCOUNTER — Encounter (HOSPITAL_COMMUNITY): Payer: Self-pay | Admitting: General Surgery

## 2016-05-11 ENCOUNTER — Ambulatory Visit: Payer: Medicare Other | Admitting: Hematology and Oncology

## 2016-05-12 ENCOUNTER — Encounter: Payer: Self-pay | Admitting: Hematology and Oncology

## 2016-05-12 ENCOUNTER — Ambulatory Visit (HOSPITAL_BASED_OUTPATIENT_CLINIC_OR_DEPARTMENT_OTHER): Payer: Medicare Other | Admitting: Hematology and Oncology

## 2016-05-12 ENCOUNTER — Telehealth: Payer: Self-pay | Admitting: Hematology and Oncology

## 2016-05-12 DIAGNOSIS — Z170 Estrogen receptor positive status [ER+]: Secondary | ICD-10-CM

## 2016-05-12 DIAGNOSIS — C50811 Malignant neoplasm of overlapping sites of right female breast: Secondary | ICD-10-CM

## 2016-05-12 DIAGNOSIS — C50411 Malignant neoplasm of upper-outer quadrant of right female breast: Secondary | ICD-10-CM

## 2016-05-12 MED ORDER — TAMOXIFEN CITRATE 20 MG PO TABS: 20.0000 mg | ORAL_TABLET | Freq: Every day | ORAL | 0 refills | Status: DC

## 2016-05-12 NOTE — Telephone Encounter (Signed)
appt made and avs printed °

## 2016-05-12 NOTE — Assessment & Plan Note (Signed)
Right lumpectomy 05/03/2016: IDC grade 2, 1.3 cm, DCIS with calcifications and necrosis, lymphovascular invasion is identified, margins negative, 0/3 lymph nodes negative, ER 95%, PR 60%, HER-2 negative, Ki-67 20%, T1 cN0 stage IA  Pathology counseling: I discussed the final pathology report of the patient provided  a copy of this report. I discussed the margins as well as lymph node surgeries. We also discussed the final staging along with previously performed ER/PR and HER-2/neu testing.  Recommendation: 1. Adjuvant radiation therapy followed by 2. optional antiestrogen therapy with tamoxifen (prior diagnosis of osteoporosis)  Genetics consultation be obtained because she has a daughter with breast cancer at age 53. Return to clinic after radiation is complete.

## 2016-05-12 NOTE — Progress Notes (Signed)
Patient Care Team: [name], MD as PCP - General (Internal Medicine) [name], MD as Consulting Physician (General Surgery) Nicholas Lose, MD as Consulting Physician (Hematology and Oncology) Gery Pray, MD as Consulting Physician (Radiation Oncology)  DIAGNOSIS: Breast cancer of upper-outer quadrant of right female breast [facility])   Staging form: Breast, AJCC 7th Edition   - Clinical stage from 04/13/2016: Stage IA (T1b, N0, M0) - Unsigned         Staging comments: Staged at breast conference on 7.19.17  SUMMARY OF ONCOLOGIC HISTORY:   Breast cancer of upper-outer quadrant of right female breast ([place])   04/05/2016 Initial Diagnosis    Right breast biopsy 12:00:IDC, LVI present, grade 2-3, ER 95%, PR 60%, HER-2 negative ratio 1.42, Ki-67 20%, screening detected right breast mass 7 mm axilla negative,T1bN0 stage IA clinical stage     05/03/2016 Surgery    Right lumpectomy: IDC grade 2, 1.3 cm, DCIS with calcifications and necrosis, lymphovascular invasion is identified, margins negative, 0/3 lymph nodes negative, ER 95%, PR 60%, HER-2 negative, Ki-67 20%, T1 cN0 stage IA      CHIEF COMPLIANT: follow-up after recent lumpectomy  INTERVAL HISTORY: Carla [name] is a 82 year old with above-mentioned history of right breast cancer treated with recent lumpectomy and is here to discuss the final pathology report. She reports very minimal pain at the site of surgery. She is accompanied by her daughter.  REVIEW OF SYSTEMS:   Constitutional: Denies fevers, chills or abnormal weight loss Eyes: Denies blurriness of vision Ears, nose, mouth, throat, and face: Denies mucositis or sore throat Respiratory: Denies cough, dyspnea or wheezes Cardiovascular: Denies palpitation, chest discomfort Gastrointestinal:  Denies nausea, heartburn or change in bowel habits Skin: Denies abnormal skin rashes Lymphatics: Denies new lymphadenopathy or easy bruising Neurological:Denies numbness, tingling or  new weaknesses Behavioral/Psych: Mood is stable, no new changes  Extremities: No lower extremity edema Breast: discomfort related to recent lumpectomy All other systems were reviewed with the patient and are negative.  I have reviewed the past medical history, past surgical history, social history and family history with the patient and they are unchanged from previous note.  ALLERGIES:  has No Known Allergies.  MEDICATIONS:  Current Outpatient Prescriptions  Medication Sig Dispense Refill  . albuterol (PROVENTIL HFA;VENTOLIN HFA) 108 (90 BASE) MCG/ACT inhaler Inhale 2 puffs into the lungs every 6 (six) hours as needed for wheezing or shortness of breath. 3.7 g 5  . aspirin EC 81 MG tablet Take 81 mg by mouth daily.    . Biotin 10 MG TABS Take 10 mg by mouth daily.    . cetirizine (ZYRTEC) 10 MG tablet Take 10 mg by mouth daily.    . diazepam (VALIUM) 5 MG tablet Take 1 tablet (5 mg total) by mouth every 6 (six) hours as needed for anxiety. 60 tablet 0  . diclofenac sodium (VOLTAREN) 1 % GEL Apply 2 g topically as needed (pain).     . fluticasone (FLONASE) 50 MCG/ACT nasal spray Place 1 spray into both nostrils daily as needed for allergies.     [name] HYDROcodone-acetaminophen (NORCO) 5-325 MG tablet Take 1-2 tablets by mouth every 4 (four) hours as needed. 30 tablet 0  . lidocaine (LIDODERM) 5 % Place 1 patch onto the skin as needed. Remove & Discard patch within 12 hours or as directed by MD    . losartan-hydrochlorothiazide (HYZAAR) 50-12.5 MG per tablet Take 0.5 tablets by mouth every morning.     . Multiple  Vitamins-Minerals (OCUVITE PRESERVISION PO) Take 1 tablet by mouth 2 (two) times daily.    . Olodaterol HCl (STRIVERDI RESPIMAT) 2.5 MCG/ACT AERS Inhale 2 puffs into the lungs every morning.     [name] omeprazole (PRILOSEC) 40 MG capsule Take 40 mg by mouth 2 (two) times daily.      No current facility-administered medications for this visit.     PHYSICAL EXAMINATION: ECOG PERFORMANCE  STATUS: 1 - Symptomatic but completely ambulatory  Vitals:   05/12/16 1506  BP: 140/69  Pulse: 93  Resp: 17  Temp: 97.6 F (36.4 C)   Filed Weights   05/12/16 1506  Weight: 126 lb 6.4 oz (57.3 kg)    GENERAL:alert, no distress and comfortable SKIN: skin color, texture, turgor are normal, no rashes or significant lesions EYES: normal, Conjunctiva are pink and non-injected, sclera clear OROPHARYNX:no exudate, no erythema and lips, buccal mucosa, and tongue normal  NECK: supple, thyroid normal size, non-tender, without nodularity LYMPH:  no palpable lymphadenopathy in the cervical, axillary or inguinal LUNGS: clear to auscultation and percussion with normal breathing effort HEART: regular rate & rhythm and no murmurs and no lower extremity edema ABDOMEN:abdomen soft, non-tender and normal bowel sounds MUSCULOSKELETAL:no cyanosis of digits and no clubbing  NEURO: alert & oriented x 3 with fluent speech, no focal motor/sensory deficits EXTREMITIES: No lower extremity edema   LABORATORY DATA:  I have reviewed the data as listed   Chemistry      Component Value Date/Time   NA 141 04/28/2016 1353   NA 141 04/13/2016 1223   K 3.6 04/28/2016 1353   K 4.1 04/13/2016 1223   CL 106 04/28/2016 1353   CO2 28 04/28/2016 1353   CO2 28 04/13/2016 1223   BUN 13 04/28/2016 1353   BUN 14.2 04/13/2016 1223   CREATININE 0.76 04/28/2016 1353   CREATININE 0.8 04/13/2016 1223      Component Value Date/Time   CALCIUM 9.2 04/28/2016 1353   CALCIUM 9.2 04/13/2016 1223   ALKPHOS 105 04/13/2016 1223   AST 16 04/13/2016 1223   ALT 12 04/13/2016 1223   BILITOT 0.51 04/13/2016 1223       Lab Results  Component Value Date   WBC 9.0 04/28/2016   HGB 13.1 04/28/2016   HCT 41.1 04/28/2016   MCV 101.5 (H) 04/28/2016   PLT 367 04/28/2016   NEUTROABS 5.0 04/13/2016     ASSESSMENT & PLAN:  Breast cancer of upper-outer quadrant of right female breast ([place]) Right lumpectomy 05/03/2016: IDC  grade 2, 1.3 cm, DCIS with calcifications and necrosis, lymphovascular invasion is identified, margins negative, 0/3 lymph nodes negative, ER 95%, PR 60%, HER-2 negative, Ki-67 20%, T1 cN0 stage IA  Pathology counseling: I discussed the final pathology report of the patient provided  a copy of this report. I discussed the margins as well as lymph node surgeries. We also discussed the final staging along with previously performed ER/PR and HER-2/neu testing. We had lengthy discussion regarding treatment options after lumpectomy. Given her advanced age, we discussed the relative benefit of adjuvant radiation Tends to be fairly minimal. Because of this she does not want to make an appointment to see radiation oncology.  Recommendation: 1. Patient did not want to go through adjuvant radiation therapy 2. Start antiestrogen therapy with tamoxifen (prior diagnosis of osteoporosis)  Genetics consultation be obtained because she has a daughter with breast cancer at age 73. Return to clinic in 1 month to assess tolerability to tamoxifen therapy. We will try  to coincide this with the genetics appointment.   No orders of the defined types were placed in this encounter.  The patient has a good understanding of the overall plan. she agrees with it. she will call with any problems that may develop before the next visit here.   Rulon Eisenmenger, MD 05/12/16

## 2016-05-17 ENCOUNTER — Other Ambulatory Visit: Payer: Medicare Other

## 2016-05-17 ENCOUNTER — Encounter: Payer: Medicare Other | Admitting: Genetic Counselor

## 2016-05-18 ENCOUNTER — Ambulatory Visit: Payer: Medicare Other | Admitting: Radiation Oncology

## 2016-05-18 ENCOUNTER — Telehealth: Payer: Self-pay | Admitting: Radiation Oncology

## 2016-05-18 NOTE — Telephone Encounter (Signed)
05/25/2016 Appointment canceled per patient request. Daughter called to cancel appointments because patient had already had a visit with Dr. Lindi Adie and was told the appointments could be canceled.

## 2016-05-19 ENCOUNTER — Ambulatory Visit: Payer: Medicare Other | Admitting: Pulmonary Disease

## 2016-05-24 ENCOUNTER — Ambulatory Visit (INDEPENDENT_AMBULATORY_CARE_PROVIDER_SITE_OTHER): Payer: Medicare Other | Admitting: Pulmonary Disease

## 2016-05-24 ENCOUNTER — Encounter: Payer: Self-pay | Admitting: Pulmonary Disease

## 2016-05-24 DIAGNOSIS — A310 Pulmonary mycobacterial infection: Secondary | ICD-10-CM

## 2016-05-24 DIAGNOSIS — J479 Bronchiectasis, uncomplicated: Secondary | ICD-10-CM

## 2016-05-24 MED ORDER — LEVOFLOXACIN 750 MG PO TABS: 750.0000 mg | ORAL_TABLET | Freq: Every day | ORAL | 0 refills | Status: DC

## 2016-05-24 NOTE — Assessment & Plan Note (Signed)
This has been a stable interval for her. She has not had an exacerbation since the last visit. Her sputum culture from this year showed Pseudomonas.  Plan: Continue Stiverdi In the event of a respiratory infection use the flutter valve more frequently with Mucinex, if no improvement then take the Levaquin prescribed today Follow-up 6 months

## 2016-05-24 NOTE — Patient Instructions (Signed)
In the event that she become ill with a respiratory infection use her flutter valve 4-5 breaths, 4-5 times per day before using an antibiotic Take Mucinex in the event of a respiratory infection Keep taking your inhaled medicines as you're doing I will see you back in 6 months, however if you have worsening respiratory symptoms between now and then please let me know.
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
## 2016-05-24 NOTE — Assessment & Plan Note (Signed)
MAI grew out in May 2017. However, she has no symptoms to suggest an active infection as her weight has been stable and her breathing has improved this year. We will continue to monitor for this. If she has worsening signs or symptoms then I will repeat a sputum culture but at this time I see no indication to repeat sputum culture or treat this as I believe she is colonized.

## 2016-05-25 ENCOUNTER — Ambulatory Visit: Payer: Medicare Other | Admitting: Radiation Oncology

## 2016-05-26 ENCOUNTER — Other Ambulatory Visit: Payer: Self-pay | Admitting: *Deleted

## 2016-05-26 DIAGNOSIS — C50411 Malignant neoplasm of upper-outer quadrant of right female breast: Secondary | ICD-10-CM

## 2016-06-10 ENCOUNTER — Encounter (HOSPITAL_COMMUNITY): Payer: Self-pay | Admitting: *Deleted

## 2016-06-10 ENCOUNTER — Emergency Department (HOSPITAL_COMMUNITY)
Admission: EM | Admit: 2016-06-10 | Discharge: 2016-06-10 | Disposition: A | Discharge status: Home / Self Care | Payer: Medicare Other | Attending: Emergency Medicine | Admitting: Emergency Medicine

## 2016-06-10 ENCOUNTER — Emergency Department (HOSPITAL_BASED_OUTPATIENT_CLINIC_OR_DEPARTMENT_OTHER): Admit: 2016-06-10 | Discharge: 2016-06-10 | Disposition: A | Discharge status: Home / Self Care | Payer: Medicare Other

## 2016-06-10 DIAGNOSIS — Z7982 Long term (current) use of aspirin: Secondary | ICD-10-CM | POA: Diagnosis not present

## 2016-06-10 DIAGNOSIS — M7989 Other specified soft tissue disorders: Secondary | ICD-10-CM | POA: Diagnosis not present

## 2016-06-10 DIAGNOSIS — Z79899 Other long term (current) drug therapy: Secondary | ICD-10-CM | POA: Insufficient documentation

## 2016-06-10 DIAGNOSIS — Z853 Personal history of malignant neoplasm of breast: Secondary | ICD-10-CM | POA: Insufficient documentation

## 2016-06-10 DIAGNOSIS — M25472 Effusion, left ankle: Secondary | ICD-10-CM | POA: Insufficient documentation

## 2016-06-10 DIAGNOSIS — M79609 Pain in unspecified limb: Secondary | ICD-10-CM | POA: Diagnosis not present

## 2016-06-10 DIAGNOSIS — J45909 Unspecified asthma, uncomplicated: Secondary | ICD-10-CM | POA: Diagnosis not present

## 2016-06-10 DIAGNOSIS — I10 Essential (primary) hypertension: Secondary | ICD-10-CM | POA: Diagnosis not present

## 2016-06-10 DIAGNOSIS — M25571 Pain in right ankle and joints of right foot: Secondary | ICD-10-CM | POA: Diagnosis not present

## 2016-06-10 LAB — PROTIME-INR
INR: 0.98
Prothrombin Time: 13 seconds (ref 11.4–15.2)

## 2016-06-10 LAB — CBC WITH DIFFERENTIAL/PLATELET
BASOS PCT: 1 %
Basophils Absolute: 0 10*3/uL (ref 0.0–0.1)
Eosinophils Absolute: 0.2 10*3/uL (ref 0.0–0.7)
Eosinophils Relative: 2 %
HEMATOCRIT: 37.7 % (ref 36.0–46.0)
HEMOGLOBIN: 12.3 g/dL (ref 12.0–15.0)
LYMPHS ABS: 2.4 10*3/uL (ref 0.7–4.0)
LYMPHS PCT: 29 %
MCH: 32.7 pg (ref 26.0–34.0)
MCHC: 32.6 g/dL (ref 30.0–36.0)
MCV: 100.3 fL — ABNORMAL HIGH (ref 78.0–100.0)
MONOS PCT: 9 %
Monocytes Absolute: 0.7 10*3/uL (ref 0.1–1.0)
NEUTROS ABS: 4.9 10*3/uL (ref 1.7–7.7)
NEUTROS PCT: 59 %
Platelets: 379 10*3/uL (ref 150–400)
RBC: 3.76 MIL/uL — ABNORMAL LOW (ref 3.87–5.11)
RDW: 14.5 % (ref 11.5–15.5)
WBC: 8.4 10*3/uL (ref 4.0–10.5)

## 2016-06-10 LAB — COMPREHENSIVE METABOLIC PANEL
ALBUMIN: 3.9 g/dL (ref 3.5–5.0)
ALK PHOS: 78 U/L (ref 38–126)
ALT: 16 U/L (ref 14–54)
ANION GAP: 6 (ref 5–15)
AST: 22 U/L (ref 15–41)
BILIRUBIN TOTAL: 0.3 mg/dL (ref 0.3–1.2)
BUN: 21 mg/dL — AB (ref 6–20)
CALCIUM: 9 mg/dL (ref 8.9–10.3)
CO2: 29 mmol/L (ref 22–32)
Chloride: 105 mmol/L (ref 101–111)
Creatinine, Ser: 0.7 mg/dL (ref 0.44–1.00)
GFR calc Af Amer: >60 mL/min (ref >60)
GFR calc non Af Amer: >60 mL/min (ref >60)
GLUCOSE: 102 mg/dL — AB (ref 65–99)
Potassium: 3.6 mmol/L (ref 3.5–5.1)
Sodium: 140 mmol/L (ref 135–145)
TOTAL PROTEIN: 6.6 g/dL (ref 6.5–8.1)
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
## 2016-06-10 NOTE — Progress Notes (Signed)
VASCULAR LAB PRELIMINARY  PRELIMINARY  PRELIMINARY  PRELIMINARY  Left lower extremity venous duplex completed.    Preliminary report:  Left:  No evidence of DVT, superficial thrombosis, or Baker's cyst.  Raeven Pint, RVS 06/10/2016, 9:42 PM

## 2016-06-10 NOTE — ED Triage Notes (Signed)
Pt states foot felt like it was asleep Tuesday, fell due to the numbness, then Tuesday about noon noted swelling in left foot, better Thursday, Today swelling with some redness

## 2016-06-13 ENCOUNTER — Telehealth: Payer: Self-pay | Admitting: *Deleted

## 2016-06-13 NOTE — Telephone Encounter (Signed)
Pt states she went to ED on Friday after a fall on Tuesday. She was reading about SE from tamoxifen and was wondering if tamoxifen would create swelling and pain and bruising. With clarification it is just her left ankle that is swollen and bruised down into her instep. The only achiness is in the instep. She denies generalized bruising, aching or swelling. Told her to continue her tamoxifen and keep her appt on 9/25. To keep her foot elevated and alternate heat and ice.

## 2016-06-13 NOTE — Telephone Encounter (Signed)
Call from [facility] with Dr. Marlou Starks: Pt is calling re: leg edema. Was seen in ED and was negative for DVT. Asking if edema is side effect of Tamoxifen. Noted call was handled by Triage RN. Pt will follow up 9/25 as scheduled.

## 2016-06-17 ENCOUNTER — Other Ambulatory Visit: Payer: Self-pay

## 2016-06-17 DIAGNOSIS — C50411 Malignant neoplasm of upper-outer quadrant of right female breast: Secondary | ICD-10-CM

## 2016-06-20 ENCOUNTER — Ambulatory Visit (HOSPITAL_BASED_OUTPATIENT_CLINIC_OR_DEPARTMENT_OTHER): Payer: Self-pay | Admitting: Genetic Counselor

## 2016-06-20 ENCOUNTER — Encounter: Payer: Self-pay | Admitting: Hematology and Oncology

## 2016-06-20 ENCOUNTER — Ambulatory Visit (HOSPITAL_BASED_OUTPATIENT_CLINIC_OR_DEPARTMENT_OTHER): Payer: Medicare Other | Admitting: Hematology and Oncology

## 2016-06-20 ENCOUNTER — Encounter: Payer: Self-pay | Admitting: Genetic Counselor

## 2016-06-20 ENCOUNTER — Other Ambulatory Visit (HOSPITAL_BASED_OUTPATIENT_CLINIC_OR_DEPARTMENT_OTHER): Payer: Medicare Other

## 2016-06-20 DIAGNOSIS — M25572 Pain in left ankle and joints of left foot: Secondary | ICD-10-CM | POA: Diagnosis not present

## 2016-06-20 DIAGNOSIS — Z8042 Family history of malignant neoplasm of prostate: Secondary | ICD-10-CM

## 2016-06-20 DIAGNOSIS — Z853 Personal history of malignant neoplasm of breast: Secondary | ICD-10-CM | POA: Diagnosis not present

## 2016-06-20 DIAGNOSIS — C50411 Malignant neoplasm of upper-outer quadrant of right female breast: Secondary | ICD-10-CM | POA: Diagnosis not present

## 2016-06-20 DIAGNOSIS — Z803 Family history of malignant neoplasm of breast: Secondary | ICD-10-CM

## 2016-06-20 DIAGNOSIS — Z170 Estrogen receptor positive status [ER+]: Secondary | ICD-10-CM | POA: Diagnosis not present

## 2016-06-20 LAB — COMPREHENSIVE METABOLIC PANEL
ALBUMIN: 3.5 g/dL (ref 3.5–5.0)
ALT: 15 U/L (ref 0–55)
AST: 17 U/L (ref 5–34)
Alkaline Phosphatase: 84 U/L (ref 40–150)
Anion Gap: 9 mEq/L (ref 3–11)
BILIRUBIN TOTAL: 0.39 mg/dL (ref 0.20–1.20)
BUN: 11.9 mg/dL (ref 7.0–26.0)
CO2: 28 meq/L (ref 22–29)
CREATININE: 0.7 mg/dL (ref 0.6–1.1)
Calcium: 9.2 mg/dL (ref 8.4–10.4)
Chloride: 107 mEq/L (ref 98–109)
EGFR: 78 mL/min/{1.73_m2} — ABNORMAL LOW (ref >90)
GLUCOSE: 91 mg/dL (ref 70–140)
Potassium: 3.8 mEq/L (ref 3.5–5.1)
SODIUM: 144 meq/L (ref 136–145)
TOTAL PROTEIN: 6.5 g/dL (ref 6.4–8.3)

## 2016-06-20 LAB — CBC WITH DIFFERENTIAL/PLATELET
BASO%: 0.4 % (ref 0.0–2.0)
Basophils Absolute: 0 10*3/uL (ref 0.0–0.1)
EOS%: 1.4 % (ref 0.0–7.0)
Eosinophils Absolute: 0.1 10*3/uL (ref 0.0–0.5)
HCT: 38.3 % (ref 34.8–46.6)
HEMOGLOBIN: 12.6 g/dL (ref 11.6–15.9)
LYMPH%: 26.1 % (ref 14.0–49.7)
MCH: 32.5 pg (ref 25.1–34.0)
MCHC: 33 g/dL (ref 31.5–36.0)
MCV: 98.5 fL (ref 79.5–101.0)
MONO#: 0.6 10*3/uL (ref 0.1–0.9)
MONO%: 7.1 % (ref 0.0–14.0)
NEUT%: 65 % (ref 38.4–76.8)
NEUTROS ABS: 5.8 10*3/uL (ref 1.5–6.5)
PLATELETS: 401 10*3/uL — AB (ref 145–400)
RBC: 3.89 10*6/uL (ref 3.70–5.45)
RDW: 14.4 % (ref 11.2–14.5)
WBC: 9 10*3/uL (ref 3.9–10.3)
lymph#: 2.3 10*3/uL (ref 0.9–3.3)

## 2016-06-20 MED ORDER — TAMOXIFEN CITRATE 20 MG PO TABS: 20.0000 mg | ORAL_TABLET | Freq: Every day | ORAL | 3 refills | Status: DC

## 2016-06-20 NOTE — Progress Notes (Signed)
Patient Care Team: [name], MD as PCP - General (Internal Medicine) [name], MD as Consulting Physician (General Surgery) Nicholas Lose, MD as Consulting Physician (Hematology and Oncology) Gery Pray, MD as Consulting Physician (Radiation Oncology)  DIAGNOSIS: Breast cancer of upper-outer quadrant of right female breast [facility])   Staging form: Breast, AJCC 7th Edition   - Clinical stage from 04/13/2016: Stage IA (T1b, N0, M0) - Unsigned         Staging comments: Staged at breast conference on 7.19.17  SUMMARY OF ONCOLOGIC HISTORY:   Breast cancer of upper-outer quadrant of right female breast ([place])   04/05/2016 Initial Diagnosis    Right breast biopsy 12:00:IDC, LVI present, grade 2-3, ER 95%, PR 60%, HER-2 negative ratio 1.42, Ki-67 20%, screening detected right breast mass 7 mm axilla negative,T1bN0 stage IA clinical stage      05/03/2016 Surgery    Right lumpectomy: IDC grade 2, 1.3 cm, DCIS with calcifications and necrosis, lymphovascular invasion is identified, margins negative, 0/3 lymph nodes negative, ER 95%, PR 60%, HER-2 negative, Ki-67 20%, T1 cN0 stage IA      05/23/2016 -  Anti-estrogen oral therapy    Tamoxifen 20 mg daily, treatment holiday 06/20/2016 to December 2017 due to left ankle fracture       CHIEF COMPLIANT: Follow-up on tamoxifen therapy  INTERVAL HISTORY: Carla [name] is a 82 year old with above-mentioned history of right breast cancer treated with lumpectomy and is currently on adjuvant antiestrogen therapy. She is tolerating tamoxifen reasonably well. She can currently has lots of hot flashes. Recently she went to the emergency room with a left leg swelling. Rule out DVT. She was informed yesterday that she had a fracture in the left ankle. She currently has swelling of the left ankle.  REVIEW OF SYSTEMS:   Constitutional: Denies fevers, chills or abnormal weight loss Eyes: Denies blurriness of vision Ears, nose, mouth, throat, and face:  Denies mucositis or sore throat Respiratory: Denies cough, dyspnea or wheezes Cardiovascular: Denies palpitation, chest discomfort Gastrointestinal:  Denies nausea, heartburn or change in bowel habits Skin: Denies abnormal skin rashes Lymphatics: Denies new lymphadenopathy or easy bruising Neurological:Denies numbness, tingling or new weaknesses Behavioral/Psych: Mood is stable, no new changes  Extremities: 1+ left lower extremity edema Breast:  denies any pain or lumps or nodules in either breasts All other systems were reviewed with the patient and are negative.  I have reviewed the past medical history, past surgical history, social history and family history with the patient and they are unchanged from previous note.  ALLERGIES:  has No Known Allergies.  MEDICATIONS:  Current Outpatient Prescriptions  Medication Sig Dispense Refill  . albuterol (PROVENTIL HFA;VENTOLIN HFA) 108 (90 BASE) MCG/ACT inhaler Inhale 2 puffs into the lungs every 6 (six) hours as needed for wheezing or shortness of breath. 3.7 g 5  . aspirin EC 81 MG tablet Take 81 mg by mouth daily.    . Biotin 10 MG TABS Take 10 mg by mouth daily.    . cetirizine (ZYRTEC) 10 MG tablet Take 10 mg by mouth daily.    . diazepam (VALIUM) 5 MG tablet Take 1 tablet (5 mg total) by mouth every 6 (six) hours as needed for anxiety. 60 tablet 0  . diclofenac sodium (VOLTAREN) 1 % GEL Apply 2 g topically as needed (pain).     . fluticasone (FLONASE) 50 MCG/ACT nasal spray Place 1 spray into both nostrils daily as needed for allergies.     [name]  levofloxacin (LEVAQUIN) 750 MG tablet Take 1 tablet (750 mg total) by mouth daily. (Patient not taking: Reported on 06/10/2016) 7 tablet 0  . lidocaine (LIDODERM) 5 % Place 1 patch onto the skin as needed. Remove & Discard patch within 12 hours or as directed by MD    . losartan-hydrochlorothiazide (HYZAAR) 50-12.5 MG per tablet Take 0.5 tablets by mouth every morning.     . Multiple  Vitamins-Minerals (OCUVITE PRESERVISION PO) Take 1 tablet by mouth 2 (two) times daily.    . Olodaterol HCl (STRIVERDI RESPIMAT) 2.5 MCG/ACT AERS Inhale 2 puffs into the lungs every morning.     [name] omeprazole (PRILOSEC) 40 MG capsule Take 40 mg by mouth 2 (two) times daily.     Derrill Memo ON 08/26/2016] tamoxifen (NOLVADEX) 20 MG tablet Take 1 tablet (20 mg total) by mouth daily. 90 tablet 3   No current facility-administered medications for this visit.     PHYSICAL EXAMINATION: ECOG PERFORMANCE STATUS: 1 - Symptomatic but completely ambulatory  Vitals:   06/20/16 1444  BP: (!) 149/68  Pulse: 97  Resp: 18  Temp: 97.9 F (36.6 C)   Filed Weights   06/20/16 1444  Weight: 126 lb 4.8 oz (57.3 kg)    GENERAL:alert, no distress and comfortable SKIN: skin color, texture, turgor are normal, no rashes or significant lesions EYES: normal, Conjunctiva are pink and non-injected, sclera clear OROPHARYNX:no exudate, no erythema and lips, buccal mucosa, and tongue normal  NECK: supple, thyroid normal size, non-tender, without nodularity LYMPH:  no palpable lymphadenopathy in the cervical, axillary or inguinal LUNGS: clear to auscultation and percussion with normal breathing effort HEART: regular rate & rhythm and no murmurs and no lower extremity edema ABDOMEN:abdomen soft, non-tender and normal bowel sounds MUSCULOSKELETAL:no cyanosis of digits and no clubbing  NEURO: alert & oriented x 3 with fluent speech, no focal motor/sensory deficits EXTREMITIES: 1+ left lower extremity edema  LABORATORY DATA:  I have reviewed the data as listed   Chemistry      Component Value Date/Time   NA 144 06/20/2016 1420   K 3.8 06/20/2016 1420   CL 105 06/10/2016 1755   CO2 28 06/20/2016 1420   BUN 11.9 06/20/2016 1420   CREATININE 0.7 06/20/2016 1420      Component Value Date/Time   CALCIUM 9.2 06/20/2016 1420   ALKPHOS 84 06/20/2016 1420   AST 17 06/20/2016 1420   ALT 15 06/20/2016 1420   BILITOT  0.39 06/20/2016 1420       Lab Results  Component Value Date   WBC 9.0 06/20/2016   HGB 12.6 06/20/2016   HCT 38.3 06/20/2016   MCV 98.5 06/20/2016   PLT 401 (H) 06/20/2016   NEUTROABS 5.8 06/20/2016   ASSESSMENT & PLAN:  Breast cancer of upper-outer quadrant of right female breast ([place]) Right lumpectomy 05/03/2016: IDC grade 2, 1.3 cm, DCIS with calcifications and necrosis, lymphovascular invasion is identified, margins negative, 0/3 lymph nodes negative, ER 95%, PR 60%, HER-2 negative, Ki-67 20%, T1 cN0 stage IA  Treatment plan: 1. Patient did not want to go through adjuvant radiation therapy 2. Started antiestrogen therapy with tamoxifen (prior diagnosis of osteoporosis) on 05/12/2016  Tamoxifen toxicities: ED visit after a fall with bruising around the ankle not related to tamoxifen therapy. Patient would like to take a couple of months time off from antiestrogen therapy. She will start back on December 2017  Return to clinic in 6 months for follow-up  No orders of the defined types  were placed in this encounter.  The patient has a good understanding of the overall plan. she agrees with it. she will call with any problems that may develop before the next visit here.   Rulon Eisenmenger, MD 06/20/16
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
## 2016-06-20 NOTE — Assessment & Plan Note (Signed)
Right lumpectomy 05/03/2016: IDC grade 2, 1.3 cm, DCIS with calcifications and necrosis, lymphovascular invasion is identified, margins negative, 0/3 lymph nodes negative, ER 95%, PR 60%, HER-2 negative, Ki-67 20%, T1 cN0 stage IA  Treatment plan: 1. Patient did not want to go through adjuvant radiation therapy 2. Started antiestrogen therapy with tamoxifen (prior diagnosis of osteoporosis) on 05/12/2016  Tamoxifen toxicities: ED visit after a fall with bruising around the ankle not related to tamoxifen therapy.  Return to clinic in 6 months for follow-up

## 2016-07-05 ENCOUNTER — Telehealth: Payer: Self-pay | Admitting: Genetic Counselor

## 2016-07-05 ENCOUNTER — Encounter: Payer: Self-pay | Admitting: Genetic Counselor

## 2016-07-05 DIAGNOSIS — Z1589 Genetic susceptibility to other disease: Secondary | ICD-10-CM | POA: Insufficient documentation

## 2016-07-05 DIAGNOSIS — Z1379 Encounter for other screening for genetic and chromosomal anomalies: Secondary | ICD-10-CM | POA: Insufficient documentation

## 2016-07-05 NOTE — Telephone Encounter (Signed)
LM on VM that we have her test results back and to please call back.  Left CB instructions.

## 2016-07-06 DIAGNOSIS — Z23 Encounter for immunization: Secondary | ICD-10-CM | POA: Diagnosis not present

## 2016-07-07 DIAGNOSIS — M25572 Pain in left ankle and joints of left foot: Secondary | ICD-10-CM | POA: Diagnosis not present

## 2016-07-27 DIAGNOSIS — M25572 Pain in left ankle and joints of left foot: Secondary | ICD-10-CM | POA: Diagnosis not present

## 2016-07-28 ENCOUNTER — Telehealth: Payer: Self-pay | Admitting: Genetic Counselor

## 2016-07-28 ENCOUNTER — Encounter: Payer: Self-pay | Admitting: Genetic Counselor

## 2016-07-28 DIAGNOSIS — Z1589 Genetic susceptibility to other disease: Secondary | ICD-10-CM

## 2016-07-28 DIAGNOSIS — Z1502 Genetic susceptibility to malignant neoplasm of ovary: Secondary | ICD-10-CM

## 2016-07-28 DIAGNOSIS — C50919 Malignant neoplasm of unspecified site of unspecified female breast: Secondary | ICD-10-CM | POA: Insufficient documentation

## 2016-07-28 DIAGNOSIS — Z1509 Genetic susceptibility to other malignant neoplasm: Secondary | ICD-10-CM

## 2016-07-28 NOTE — Telephone Encounter (Signed)
Revealed that patient has a CHEK2 mutation that may have played a part in her breast cancer diagnosis.  Daughter has scheduled an appointment to come in to discuss the result, but states that she is unsure if her mother will come.

## 2016-10-21 ENCOUNTER — Telehealth: Payer: Self-pay | Admitting: Adult Health

## 2016-10-21 NOTE — Telephone Encounter (Signed)
The Survivorship Care Plan was mailed to Carla [name] as she reported not being able to come in to the Survivorship Clinic for an in-person visit at this time. A letter was mailed to her outlining the purpose of the content of the care plan, as well as encouraging her to reach out to me with any questions or concerns.  .  Additional resources regarding healthy eating, recommendations for exercise, LIVESTRONG program, and brochures for support services were also included in the mailed packet.  A shorter version of the care plan was also routed/faxed/mailed to Carla Hatchet, MD, the patient's PCP.  I will not be placing any follow-up appointments to the Survivorship Clinic for Carla [name], but I am happy to see her at any time in the future for any survivorship concerns that may arise. Thank you for allowing me to participate in her care!  Charlestine Massed, NP [place] [phone]

## 2016-10-24 ENCOUNTER — Telehealth: Payer: Self-pay | Admitting: Hematology and Oncology

## 2016-10-24 DIAGNOSIS — H903 Sensorineural hearing loss, bilateral: Secondary | ICD-10-CM | POA: Diagnosis not present

## 2016-10-24 DIAGNOSIS — H6122 Impacted cerumen, left ear: Secondary | ICD-10-CM | POA: Diagnosis not present

## 2016-10-24 NOTE — Telephone Encounter (Signed)
Pt dtr called to r/s March appt to April. Gave pt new appt date/time 4/16 at 215 pm

## 2016-10-26 DIAGNOSIS — H903 Sensorineural hearing loss, bilateral: Secondary | ICD-10-CM | POA: Insufficient documentation

## 2016-11-07 ENCOUNTER — Encounter (HOSPITAL_COMMUNITY): Payer: Self-pay

## 2016-11-08 ENCOUNTER — Encounter (HOSPITAL_COMMUNITY): Payer: Self-pay

## 2016-11-09 DIAGNOSIS — H04129 Dry eye syndrome of unspecified lacrimal gland: Secondary | ICD-10-CM | POA: Diagnosis not present

## 2016-11-09 DIAGNOSIS — H04551 Acquired stenosis of right nasolacrimal duct: Secondary | ICD-10-CM | POA: Diagnosis not present

## 2016-11-18 DIAGNOSIS — C50411 Malignant neoplasm of upper-outer quadrant of right female breast: Secondary | ICD-10-CM | POA: Diagnosis not present

## 2016-11-21 ENCOUNTER — Telehealth: Payer: Self-pay | Admitting: Pulmonary Disease

## 2016-11-21 NOTE — Telephone Encounter (Signed)
Spoke with pharmacist, states pt brought in abx signed by BQ on 05/24/2017.  I advised that pt has not been seen since then and needs to contact our office if she feels that she needs an abx. atc pt to check on status, line rang with no answer, no vm.  Wcb.

## 2016-11-22 NOTE — Telephone Encounter (Signed)
lmtcb X1 for pt  

## 2016-11-23 NOTE — Telephone Encounter (Signed)
Attempted to contact pt. No answer, no option to leave a message. Will try back.  

## 2016-11-24 NOTE — Telephone Encounter (Signed)
LM for patient

## 2016-11-25 NOTE — Telephone Encounter (Signed)
3 attempts have been made to contact this pt. Per Triage protocol will close this message

## 2016-11-29 ENCOUNTER — Telehealth: Payer: Self-pay | Admitting: Pulmonary Disease

## 2016-11-29 ENCOUNTER — Ambulatory Visit (INDEPENDENT_AMBULATORY_CARE_PROVIDER_SITE_OTHER): Payer: Medicare Other | Admitting: Pulmonary Disease

## 2016-11-29 ENCOUNTER — Encounter: Payer: Self-pay | Admitting: Pulmonary Disease

## 2016-11-29 DIAGNOSIS — J479 Bronchiectasis, uncomplicated: Secondary | ICD-10-CM

## 2016-11-29 MED ORDER — OLODATEROL HCL 2.5 MCG/ACT IN AERS: 2.0000 | INHALATION_SPRAY | Freq: Every morning | RESPIRATORY_TRACT | 3 refills | Status: DC

## 2016-11-29 MED ORDER — FLUTICASONE PROPIONATE 50 MCG/ACT NA SUSP: 1.0000 | Freq: Every day | NASAL | 3 refills | Status: DC | PRN

## 2016-11-29 MED ORDER — LEVOFLOXACIN 500 MG PO TABS: 500.0000 mg | ORAL_TABLET | Freq: Every day | ORAL | 0 refills | Status: DC
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
## 2016-11-29 NOTE — Addendum Note (Signed)
Addended by: Len Blalock on: 11/29/2016 02:11 PM   Modules accepted: Orders

## 2016-11-29 NOTE — Telephone Encounter (Signed)
Spoke with pt's daughter, Thayer Headings. She had a question about pt's patient assistance forms. Thayer Headings wanted to know if she should check the box on pt's form stating that she has prescription drug coverage. Shirline Frees that if the pt has prescription drug coverage, she needs to disclose that on the form. Nothing further was needed.

## 2016-11-29 NOTE — Assessment & Plan Note (Signed)
This has been a stable interval for her, no exacerbations since the last visit.  She continues to tolerate her Striverdi daily wihtout difficulty.  Plan: Continue striverdi daily Continue as needed albuterol Prescription given for Levaquin to use in the event of an exacerbation Follow-up in 6 months percent if needed

## 2016-11-29 NOTE — Patient Instructions (Signed)
Continue taking your inhaled medicines as you are doing In the event of increasing congestion, shortness of breath, increasing mucus production or amount of mucus then used the Levaquin I prescribed you and please call me to let me know Otherwise, we will see you back in 6 months

## 2016-12-01 ENCOUNTER — Telehealth: Payer: Self-pay | Admitting: Pulmonary Disease

## 2016-12-01 NOTE — Telephone Encounter (Signed)
Pt's daughter called back, answered questions regarding her inhalers.  Pt's daughter asking for samples of striverdi which we do not have at this time.  Will hold message in my box to follow up on pt assistance forms.

## 2016-12-01 NOTE — Telephone Encounter (Signed)
lmtcb X1 for pt's daughter Jan

## 2016-12-01 NOTE — Telephone Encounter (Signed)
Called pt's daughter, Thayer Headings. I attempted to help her and answer her questions. She wants to speak to [place]. I wasn't helping her the way she wanted. Message and paperwork will be redirected to [place].

## 2016-12-07 ENCOUNTER — Encounter (HOSPITAL_COMMUNITY): Payer: Self-pay

## 2016-12-14 NOTE — Telephone Encounter (Signed)
Forms are still awaiting BQ's signature- these forms are filled out completely except for his signature.  Forms are in his folder to be completed upon return to clinic.

## 2016-12-14 NOTE — Telephone Encounter (Signed)
Caryl Pina please advise of PT assistance forms.  Have these been completed?  thanks

## 2016-12-19 ENCOUNTER — Ambulatory Visit: Payer: Medicare Other | Admitting: Oncology

## 2016-12-19 ENCOUNTER — Ambulatory Visit: Payer: Medicare Other | Admitting: Hematology and Oncology

## 2016-12-20 NOTE — Telephone Encounter (Signed)
Pt daughter Hortencia Pilar calling to check on the status of this request, she can be reached @ [phone].Hillery Hunter

## 2016-12-20 NOTE — Telephone Encounter (Signed)
Spoke with Jan, aware that these per the telephone have been filled out and should be signed today today while BQ is in office. Will send to [facility] to follow up

## 2016-12-21 NOTE — Telephone Encounter (Signed)
Assistance forms have been signed and faxed back to [employer]. lmtcb X1 for pt's daughter to make aware.

## 2016-12-22 NOTE — Telephone Encounter (Signed)
Called and spoke to pt's daughter. Informed her that the forms has been faxed. She verbalized understanding and denied any further questions or concerns at this time.   Will forward back to [place] in case anything further is needed before closing message.

## 2016-12-27 NOTE — Telephone Encounter (Signed)
OK to close per [facility]

## 2017-01-04 ENCOUNTER — Telehealth: Payer: Self-pay | Admitting: Pulmonary Disease

## 2017-01-04 MED ORDER — OLODATEROL HCL 2.5 MCG/ACT IN AERS: 2.0000 | INHALATION_SPRAY | Freq: Every morning | RESPIRATORY_TRACT | 3 refills | Status: DC

## 2017-01-04 NOTE — Telephone Encounter (Signed)
rx has been printed out and signed by BQ and this has been faxed.  Nothing further is needed.

## 2017-01-04 NOTE — Telephone Encounter (Signed)
rx has been printed out and placed in BQ look at to be signed and then will need to faxed to the number given for pt assistance.  Will forward to [place] to follow up on signature of med.  thanks

## 2017-01-04 NOTE — Telephone Encounter (Signed)
[facility] called back requesting 90 day supply and refills to last till 2019. Fax # [phone] attn: Tivis Ringer

## 2017-01-06 ENCOUNTER — Telehealth: Payer: Self-pay | Admitting: Pulmonary Disease

## 2017-01-06 NOTE — Telephone Encounter (Signed)
BQ  Pt called in because according to her AVS you wanted her to call if she started the Levaquin. The pt has started the medication on 4/11. Sx she was having that made her start was: increase sob, chest tightness,lots of cough, coughing up cream colored phlegm Denies fever.   Instructions      Return in about 6 months (around 06/01/2017).  Continue taking your inhaled medicines as you are doing In the event of increasing congestion, shortness of breath, increasing mucus production or amount of mucus then used the Levaquin I prescribed you and please call me to let me know Otherwise, we will see you back in 6 months

## 2017-01-07 NOTE — Telephone Encounter (Signed)
OK, thanks for letting me know.  Please check with her on Monday and make sure she is better.  Otherwise she will need an OV

## 2017-01-09 ENCOUNTER — Ambulatory Visit (INDEPENDENT_AMBULATORY_CARE_PROVIDER_SITE_OTHER): Payer: Medicare Other | Admitting: Internal Medicine

## 2017-01-09 ENCOUNTER — Encounter: Payer: Self-pay | Admitting: Hematology and Oncology

## 2017-01-09 ENCOUNTER — Encounter: Payer: Self-pay | Admitting: Internal Medicine

## 2017-01-09 ENCOUNTER — Ambulatory Visit (HOSPITAL_BASED_OUTPATIENT_CLINIC_OR_DEPARTMENT_OTHER): Payer: Medicare Other | Admitting: Hematology and Oncology

## 2017-01-09 VITALS — BP 110/70 | HR 90 | Temp 97.6°F | Ht 60.0 in | Wt 131.8 lb

## 2017-01-09 DIAGNOSIS — R808 Other proteinuria: Secondary | ICD-10-CM | POA: Diagnosis not present

## 2017-01-09 DIAGNOSIS — C50411 Malignant neoplasm of upper-outer quadrant of right female breast: Secondary | ICD-10-CM | POA: Diagnosis not present

## 2017-01-09 DIAGNOSIS — J45991 Cough variant asthma: Secondary | ICD-10-CM | POA: Diagnosis not present

## 2017-01-09 DIAGNOSIS — M810 Age-related osteoporosis without current pathological fracture: Secondary | ICD-10-CM

## 2017-01-09 DIAGNOSIS — Z170 Estrogen receptor positive status [ER+]: Secondary | ICD-10-CM | POA: Diagnosis not present

## 2017-01-09 DIAGNOSIS — I10 Essential (primary) hypertension: Secondary | ICD-10-CM | POA: Diagnosis not present

## 2017-01-09 DIAGNOSIS — N390 Urinary tract infection, site not specified: Secondary | ICD-10-CM | POA: Diagnosis not present

## 2017-01-09 DIAGNOSIS — N951 Menopausal and female climacteric states: Secondary | ICD-10-CM

## 2017-01-09 DIAGNOSIS — R8299 Other abnormal findings in urine: Secondary | ICD-10-CM | POA: Diagnosis not present

## 2017-01-09 MED ORDER — TIOTROPIUM BROMIDE-OLODATEROL 2.5-2.5 MCG/ACT IN AERS: 1.0000 | INHALATION_SPRAY | Freq: Every day | RESPIRATORY_TRACT | 0 refills | Status: DC

## 2017-01-09 MED ORDER — PREDNISONE 10 MG PO TABS: ORAL_TABLET | ORAL | 0 refills | Status: DC

## 2017-01-09 NOTE — Patient Instructions (Addendum)
Omeprazole 40 mg Take 30-60 min before first meal of the day   Work on inhaler technique:  relax and gently blow all the way out then take a nice smooth deep breath back in, triggering the inhaler at same time you start breathing in.  Hold for up to 5 seconds if you can. Blow out thru nose. Rinse and gargle with water when done      GERD (REFLUX)  is an extremely common cause of respiratory symptoms just like yours , many times with no obvious heartburn at all.    It can be treated with medication, but also with lifestyle changes including elevation of the head of your bed (ideally with 6 inch  bed blocks),  Smoking cessation, avoidance of late meals, excessive alcohol, and avoid fatty foods, chocolate, peppermint, colas, red wine, and acidic juices such as orange juice.  NO MINT OR MENTHOL PRODUCTS SO NO COUGH DROPS   USE SUGARLESS CANDY INSTEAD (Jolley ranchers or Stover's or Life Savers) or even ice chips will also do - the key is to swallow to prevent all throat clearing. NO OIL BASED VITAMINS - use powdered substitutes.    Prednisone 10 mg take  4 each am x 2 days,   2 each am x 2 days,  1 each am x 2 days and stop   Only use your albuterol as a rescue medication to be used if you can't catch your breath by resting or doing a relaxed purse lip breathing pattern.  - The less you use it, the better it will work when you need it. - Ok to use up to 2 puffs  every 4 hours if you must but call for immediate appointment if use goes up over your usual need - Don't leave home without it !!  (think of it like the spare tire for your car)   I emphasized that nasal steroids have no immediate benefit in terms of improving symptoms.  To help them reached the target tissue, the patient should use Afrin two puffs every 12 hours applied one min before using the nasal steroids.  Afrin should be stopped after no more than 5 days.  If the symptoms worsen, Afrin can be restarted after 5 days off of therapy to  prevent rebound congestion from overuse of Afrin.  I also emphasized that in no way are nasal steroids a concern in terms of "addiction".   See Tammy NP w/in 2 weeks with all your medications, even over the counter meds, separated in two separate bags, the ones you take no matter what vs the ones you stop once you feel better and take only as needed when you feel you need them.    Bring your formulary insurance list for alternative choices for steverdi Add : ? Need for HRCT Chest/ sinus CT  next

## 2017-01-09 NOTE — Progress Notes (Signed)
Patient Care Team: [name], MD as PCP - General (Internal Medicine) [name], MD as Consulting Physician (General Surgery) Nicholas Lose, MD as Consulting Physician (Hematology and Oncology) Gery Pray, MD as Consulting Physician (Radiation Oncology)  DIAGNOSIS:  Encounter Diagnosis  Name Primary?  . Malignant neoplasm of upper-outer quadrant of right breast in female, estrogen receptor positive ([place])     SUMMARY OF ONCOLOGIC HISTORY:   Breast cancer of upper-outer quadrant of right female breast ([place])   04/05/2016 Initial Diagnosis    Right breast biopsy 12:00:IDC, LVI present, grade 2-3, ER 95%, PR 60%, HER-2 negative ratio 1.42, Ki-67 20%, screening detected right breast mass 7 mm axilla negative,T1bN0 stage IA clinical stage      05/03/2016 Surgery    Right lumpectomy: IDC grade 2, 1.3 cm, DCIS with calcifications and necrosis, lymphovascular invasion is identified, margins negative, 0/3 lymph nodes negative, ER 95%, PR 60%, HER-2 negative, Ki-67 20%, T1 cN0 stage IA      05/23/2016 - 09/10/2016 Anti-estrogen oral therapy    Tamoxifen 20 mg daily, stopped in December 2017 due to hot flashes patient refused to try any other medication because of osteoporosis      06/20/2016 Genetic Testing    CHEK2 positive. Genes tested include: ATM, BARD1, BRCA1, BRCA2, BRIP1, CDH1, CHEK2, EPCAM, FANCC, MLH1, MSH2, MSH6, NBN, PALB2, PMS2, PTEN, RAD51C, RAD51D, TP53, and XRCC2.          CHIEF COMPLIANT: Surveillance of breast cancer  INTERVAL HISTORY: Carla [name] is a 83 year old with above-mentioned history of right breast cancer treated with lumpectomy and she was on antiestrogen therapy briefly and she stopped it because of severe hot flashes. Because of osteoporosis we could not try any other antiestrogen therapies. She does not want to go any further antiestrogen treatments. She reports of hot flashes although have improved though not fully gone away. She continues to suffer  from these symptoms.  REVIEW OF SYSTEMS:   Constitutional: Denies fevers, chills or abnormal weight loss Eyes: Denies blurriness of vision Ears, nose, mouth, throat, and face: Denies mucositis or sore throat Respiratory: Denies cough, dyspnea or wheezes Cardiovascular: Denies palpitation, chest discomfort Gastrointestinal:  Denies nausea, heartburn or change in bowel habits Skin: Denies abnormal skin rashes Lymphatics: Denies new lymphadenopathy or easy bruising Neurological:Denies numbness, tingling or new weaknesses Behavioral/Psych: Mood is stable, no new changes  Extremities: No lower extremity edema Breast:  denies any pain or lumps or nodules in either breasts All other systems were reviewed with the patient and are negative.  I have reviewed the past medical history, past surgical history, social history and family history with the patient and they are unchanged from previous note.  ALLERGIES:  has No Known Allergies.  MEDICATIONS:  Current Outpatient Prescriptions  Medication Sig Dispense Refill  . albuterol (PROVENTIL HFA;VENTOLIN HFA) 108 (90 BASE) MCG/ACT inhaler Inhale 2 puffs into the lungs every 6 (six) hours as needed for wheezing or shortness of breath. 3.7 g 5  . aspirin EC 81 MG tablet Take 81 mg by mouth daily.    . Biotin 10 MG TABS Take 10 mg by mouth daily.    . cetirizine (ZYRTEC) 10 MG tablet Take 10 mg by mouth daily.    . diazepam (VALIUM) 5 MG tablet Take 1 tablet (5 mg total) by mouth every 6 (six) hours as needed for anxiety. 60 tablet 0  . diclofenac sodium (VOLTAREN) 1 % GEL Apply 2 g topically as needed (pain).     [name]  fluticasone (FLONASE) 50 MCG/ACT nasal spray Place 1 spray into both nostrils daily as needed for allergies. 48 g 3  . lidocaine (LIDODERM) 5 % Place 1 patch onto the skin as needed. Remove & Discard patch within 12 hours or as directed by MD    . losartan-hydrochlorothiazide (HYZAAR) 50-12.5 MG per tablet Take 0.5 tablets by mouth every  morning.     . Multiple Vitamins-Minerals (OCUVITE PRESERVISION PO) Take 1 tablet by mouth 2 (two) times daily.    . Olodaterol HCl (STRIVERDI RESPIMAT) 2.5 MCG/ACT AERS Inhale 2 puffs into the lungs every morning. 3 Inhaler 3  . omeprazole (PRILOSEC) 40 MG capsule Take 40 mg by mouth 2 (two) times daily.     . predniSONE (DELTASONE) 10 MG tablet Take  4 each am x 2 days,   2 each am x 2 days,  1 each am x 2 days and stop 14 tablet 0  . Tiotropium Bromide-Olodaterol (STIOLTO RESPIMAT) 2.5-2.5 MCG/ACT AERS Inhale 1 puff into the lungs daily. 1 Inhaler 0   No current facility-administered medications for this visit.     PHYSICAL EXAMINATION: ECOG PERFORMANCE STATUS: 1 - Symptomatic but completely ambulatory  Vitals:   01/09/17 1403  BP: 122/64  Pulse: 96  Resp: 18  Temp: 98.4 F (36.9 C)   Filed Weights   01/09/17 1403  Weight: 132 lb 1.6 oz (59.9 kg)    GENERAL:alert, no distress and comfortable SKIN: skin color, texture, turgor are normal, no rashes or significant lesions EYES: normal, Conjunctiva are pink and non-injected, sclera clear OROPHARYNX:no exudate, no erythema and lips, buccal mucosa, and tongue normal  NECK: supple, thyroid normal size, non-tender, without nodularity LYMPH:  no palpable lymphadenopathy in the cervical, axillary or inguinal LUNGS: clear to auscultation and percussion with normal breathing effort HEART: regular rate & rhythm and no murmurs and no lower extremity edema ABDOMEN:abdomen soft, non-tender and normal bowel sounds MUSCULOSKELETAL:no cyanosis of digits and no clubbing  NEURO: alert & oriented x 3 with fluent speech, no focal motor/sensory deficits EXTREMITIES: No lower extremity edema  LABORATORY DATA:  I have reviewed the data as listed   Chemistry      Component Value Date/Time   NA 144 06/20/2016 1420   K 3.8 06/20/2016 1420   CL 105 06/10/2016 1755   CO2 28 06/20/2016 1420   BUN 11.9 06/20/2016 1420   CREATININE 0.7 06/20/2016  1420      Component Value Date/Time   CALCIUM 9.2 06/20/2016 1420   ALKPHOS 84 06/20/2016 1420   AST 17 06/20/2016 1420   ALT 15 06/20/2016 1420   BILITOT 0.39 06/20/2016 1420       Lab Results  Component Value Date   WBC 9.0 06/20/2016   HGB 12.6 06/20/2016   HCT 38.3 06/20/2016   MCV 98.5 06/20/2016   PLT 401 (H) 06/20/2016   NEUTROABS 5.8 06/20/2016    ASSESSMENT & PLAN:  Breast cancer of upper-outer quadrant of right female breast ([place]) Right lumpectomy 05/03/2016: IDC grade 2, 1.3 cm, DCIS with calcifications and necrosis, lymphovascular invasion is identified, margins negative, 0/3 lymph nodes negative, ER 95%, PR 60%, HER-2 negative, Ki-67 20%, T1 cN0 stage IA  Treatment plan: 1. Patient did not want to go through adjuvant radiation therapy 2. Startedantiestrogen therapy with tamoxifen (prior diagnosis of osteoporosis) on 05/12/2016 discontinued December 2017 due to hot flashes  Patient does not want to go on any further antiestrogen therapy. Return to clinic in 1 year for follow-up  I spent 15 minutes talking to the patient of which more than half was spent in counseling and coordination of care.  Orders Placed This Encounter  Procedures  . MM DIAG BREAST TOMO BILATERAL    Standing Status:   Future    Standing Expiration Date:   03/11/2018    Order Specific Question:   Reason for Exam (SYMPTOM  OR DIAGNOSIS REQUIRED)    Answer:   Annual evaluation for breast cancer    Order Specific Question:   Preferred imaging location?    Answer:   External    Comments:   [facility]   The patient has a good understanding of the overall plan. she agrees with it. she will call with any problems that may develop before the next visit here.   Rulon Eisenmenger, MD 01/09/17

## 2017-01-09 NOTE — Progress Notes (Signed)
Subjective:    Patient ID: Carla [name], female    DOB: March 08, 1926, 83 y.o.   MRN: [id number]  Synopsis: former patient of Dr. Gwenette Greet with bronchiectasis Dr. Gwenette Greet summarized her case as follows: CT chest 04/2014:  Bronchiectasis with nodules and tree in bud  [name] 06/2014:  No obstruction by flows, but flow volume loop suggests obstruction CT chest 04/2014:  Multiple nodules with 30m in RLL.  Chest CT 10/2014:  RLL nodule decreased to 75m other small nodules most c/w ?MAC 2017 sputum culture pseudomonas 2017 AFB sputum culture positive for MAI 1   01/09/2017 acute extended ov/Aneisha Skyles re: flare of sob/wheeze on streverdi and gerd rx  Chief Complaint  Patient presents with  . Acute Visit    Pt c/o increased SOB and wheezing for the past month.  She is coughing more, but producing less sputum, cream in color.   on steverdi  Already rx with levaquin and no better, assoc with nasal congestion / poor hfa use and doesn't find it particularly effective when she tries it.  Cough is worse at hs, but  s excess/ purulent sputum or mucus plugs or hemoptysis Doe = MMRC2 = can't walk a nl pace on a flat grade s sob but does fine slow and flat eg    No obvious day to day or daytime variability or assoc  or cp or chest tightness, subjective wheeze or overt sinus or hb symptoms. No unusual exp hx or h/o childhood pna/ asthma or knowledge of premature birth.  Sleeping ok without nocturnal  or early am exacerbation  of respiratory  c/o's or need for noct saba. Also denies any obvious fluctuation of symptoms with weather or environmental changes or other aggravating or alleviating factors except as outlined above   Current Medications, Allergies, Complete Past Medical History, Past Surgical History, Family History, and Social History were reviewed in [employer].  ROS  The following are not active complaints unless bolded sore throat, dysphagia, dental problems, itching,  sneezing,  nasal congestion or excess/ purulent secretions, ear ache,   fever, chills, sweats, unintended wt loss, classically pleuritic or exertional cp, hemoptysis,  orthopnea pnd or leg swelling, presyncope, palpitations, abdominal pain, anorexia, nausea, vomiting, diarrhea  or change in bowel or bladder habits, change in stools or urine, dysuria,hematuria,  rash, arthralgias, visual complaints, headache, numbness, weakness or ataxia or problems with walking or coordination,  change in mood/affect or memory.                 Objective:   Physical Exam    amb wf nad    Wt Readings from Last 3 Encounters:  01/09/17 131 lb 12.8 oz (59.8 kg)  01/09/17 132 lb 1.6 oz (59.9 kg)  11/29/16 134 lb (60.8 kg)    Vital signs reviewed  - Note on arrival 02 sats  95 % on RA     HEENT: nl dentition, turbinates bilaterally, and oropharynx. Nl external ear canals without cough reflex   NECK :  without JVD/Nodes/TM/ nl carotid upstrokes bilaterally   LUNGS: no acc muscle use,  Nl contour chest with a few insp rhonchi /crackles in bases and mid exp wheeze bilaterally    CV:  RRR  no s3 or murmur or increase in P2, and no edema   ABD:  soft and nontender with nl inspiratory excursion in the supine position. No bruits or organomegaly appreciated, bowel sounds nl  MS:  Nl gait/ ext warm without deformities,  calf tenderness, cyanosis or clubbing No obvious joint restrictions   SKIN: warm and dry without lesions    NEURO:  alert, approp, nl sensorium with  no motor or cerebellar deficits apparent.           Assessment & Plan:

## 2017-01-09 NOTE — Assessment & Plan Note (Signed)
Right lumpectomy 05/03/2016: IDC grade 2, 1.3 cm, DCIS with calcifications and necrosis, lymphovascular invasion is identified, margins negative, 0/3 lymph nodes negative, ER 95%, PR 60%, HER-2 negative, Ki-67 20%, T1 cN0 stage IA  Treatment plan: 1. Patient did not want to go through adjuvant radiation therapy 2. Startedantiestrogen therapy with tamoxifen (prior diagnosis of osteoporosis) on 05/12/2016  Tamoxifen toxicities: ED visit after a fall with bruising around the ankle not related to tamoxifen therapy. Patient took a couple of months time off from antiestrogen therapy. She restarted on December 2017  Return to clinic in 1 year for follow-up

## 2017-01-09 NOTE — Telephone Encounter (Signed)
Called and spoke with pt and she stated that she is better but still feels she has a long way to go.  She stated that the levaquin has helped, and she will wait it out a couple of more days and see how she is.  If she feels that she needs to come in she will call for appt. Will forward back to BQ as FYI.

## 2017-01-09 NOTE — Telephone Encounter (Signed)
Thanks for calling

## 2017-01-10 ENCOUNTER — Encounter: Payer: Self-pay | Admitting: Internal Medicine

## 2017-01-10 DIAGNOSIS — J45991 Cough variant asthma: Secondary | ICD-10-CM | POA: Insufficient documentation

## 2017-01-10 DIAGNOSIS — J45909 Unspecified asthma, uncomplicated: Secondary | ICD-10-CM | POA: Insufficient documentation

## 2017-01-10 NOTE — Assessment & Plan Note (Addendum)
[name] 06/2014:  No obstruction by flows, but flow volume loop suggests obstruction 01/09/2017  After extensive coaching HFA effectiveness =    75% from baseline of < 50    DDX of  difficult airways management almost all start with A and  include Adherence, Ace Inhibitors, Acid Reflux, Active Sinus Disease, Alpha 1 Antitripsin deficiency, Anxiety masquerading as Airways dz,  ABPA,  Allergy(esp in young), Aspiration (esp in elderly), Adverse effects of meds,  Active smokers, A bunch of PE's (a small clot burden can't cause this syndrome unless there is already severe underlying pulm or vascular dz with poor reserve) plus two Bs  = Bronchiectasis and Beta blocker use..and one C= CHF  Adherence is always the initial "prime suspect" and is a multilayered concern that requires a "trust but verify" approach in every patient - starting with knowing how to use medications, especially inhalers, correctly, keeping up with refills and understanding the fundamental difference between maintenance and prns vs those medications only taken for a very short course and then stopped and not refilled.  - see hfa teaching above/ completed - concerns with cost of care may be interfering in compliance> formulary issues reviewed - needs to return with formulary and all meds in hand using a trust but verify approach to confirm accurate Medication  Reconciliation The principal here is that until we are certain that the  patients are doing what we've asked, it makes no sense to ask them to do more.   ? Acid (or non-acid) GERD > always difficult to exclude as up to 75% of pts in some series report no assoc GI/ Heartburn symptoms> rec continue max (24h)  acid suppression and diet restrictions/ reviewed     ? Allergy/airway inflammatory component > Prednisone 10 mg take  4 each am x 2 days,   2 each am x 2 days,  1 each am x 2 days and stop and ? Whether would be better to use low dose ics eg symb 80 2bid vs LABA she's on if  affordable  ? Active sinus/rhinitis > reviewed how to use afrin/flonase together ? Need for CT sinus next   ? Anxiety > usually at the bottom of this list of usual suspects but should be much higher on this pt's based on H and P and note already on psychotropics  (valium)   ? Bronchiectasis > rx already with levaquin, no further abx needed at this point but ? Need for HRCT Chest next     I had an extended discussion with the patient reviewing all relevant studies completed to date and  lasting 25 minutes of a 40  minute acute office  visit for pt not previously known to me with severe / non-specific but potentially very serious refractory respiratory symptoms of unknown etiology.  Each maintenance medication was reviewed in detail including most importantly the difference between maintenance and prns and under what circumstances the prns are to be triggered using an action plan format that is not reflected in the computer generated alphabetically organized AVS.    Please see AVS for specific instructions unique to this office visit that I personally wrote and verbalized to the the pt in detail and then reviewed with pt  by my nurse highlighting any changes in therapy/plan of care  recommended at today's visit.

## 2017-01-17 DIAGNOSIS — L988 Other specified disorders of the skin and subcutaneous tissue: Secondary | ICD-10-CM | POA: Diagnosis not present

## 2017-01-17 DIAGNOSIS — F418 Other specified anxiety disorders: Secondary | ICD-10-CM | POA: Diagnosis not present

## 2017-01-17 DIAGNOSIS — Z6825 Body mass index (BMI) 25.0-25.9, adult: Secondary | ICD-10-CM | POA: Diagnosis not present

## 2017-01-17 DIAGNOSIS — M25561 Pain in right knee: Secondary | ICD-10-CM | POA: Diagnosis not present

## 2017-01-17 DIAGNOSIS — I8390 Asymptomatic varicose veins of unspecified lower extremity: Secondary | ICD-10-CM | POA: Diagnosis not present

## 2017-01-17 DIAGNOSIS — J302 Other seasonal allergic rhinitis: Secondary | ICD-10-CM | POA: Diagnosis not present

## 2017-01-17 DIAGNOSIS — Z0000 Encounter for general adult medical examination without abnormal findings: Secondary | ICD-10-CM | POA: Diagnosis not present

## 2017-01-17 DIAGNOSIS — I10 Essential (primary) hypertension: Secondary | ICD-10-CM | POA: Diagnosis not present

## 2017-01-17 DIAGNOSIS — H268 Other specified cataract: Secondary | ICD-10-CM | POA: Diagnosis not present

## 2017-01-17 DIAGNOSIS — K219 Gastro-esophageal reflux disease without esophagitis: Secondary | ICD-10-CM | POA: Diagnosis not present

## 2017-01-17 DIAGNOSIS — Z1389 Encounter for screening for other disorder: Secondary | ICD-10-CM | POA: Diagnosis not present

## 2017-01-27 ENCOUNTER — Encounter: Payer: Medicare Other | Admitting: Adult Health

## 2017-01-31 ENCOUNTER — Encounter: Payer: Self-pay | Admitting: Adult Health

## 2017-01-31 ENCOUNTER — Ambulatory Visit (INDEPENDENT_AMBULATORY_CARE_PROVIDER_SITE_OTHER): Payer: Medicare Other | Admitting: Adult Health

## 2017-01-31 ENCOUNTER — Ambulatory Visit (INDEPENDENT_AMBULATORY_CARE_PROVIDER_SITE_OTHER)
Admission: RE | Admit: 2017-01-31 | Discharge: 2017-01-31 | Disposition: A | Discharge status: Home / Self Care | Payer: Medicare Other | Source: Ambulatory Visit | Attending: Adult Health | Admitting: Adult Health

## 2017-01-31 VITALS — BP 110/76 | HR 86 | Ht 60.0 in | Wt 131.8 lb

## 2017-01-31 DIAGNOSIS — Z853 Personal history of malignant neoplasm of breast: Secondary | ICD-10-CM | POA: Diagnosis not present

## 2017-01-31 DIAGNOSIS — J479 Bronchiectasis, uncomplicated: Secondary | ICD-10-CM

## 2017-01-31 DIAGNOSIS — J42 Unspecified chronic bronchitis: Secondary | ICD-10-CM | POA: Diagnosis not present

## 2017-01-31 MED ORDER — LEVOFLOXACIN 500 MG PO TABS: 500.0000 mg | ORAL_TABLET | Freq: Every day | ORAL | 0 refills | Status: AC

## 2017-01-31 NOTE — Patient Instructions (Addendum)
Stop Stiolto .  Continue on Striverdi 2 puffs daily.  Mucinex Twice daily  As needed  Cough/congestion w/ flutter valve.  Levaquin to have on hold if cough worsens with discolored mucus , call our office if you start this .  Chest xray today .  Follow up with Dr. Lake Bells in 3 months and As needed

## 2017-01-31 NOTE — Progress Notes (Signed)
Left message for patient to contact office.
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
## 2017-01-31 NOTE — Assessment & Plan Note (Addendum)
Recent flare now resolving  Typically has rx for Levaquin to have on hold -she is aware to call if has to use.  Will stay on Striverdi as it will be affordable for her w/ pt assistance program.  Check cxr   Plan  Patient Instructions  Stop Stiolto .  Continue on Striverdi 2 puffs daily.  Mucinex Twice daily  As needed  Cough/congestion w/ flutter valve.  Levaquin to have on hold if cough worsens with discolored mucus , call our office if you start this .  Chest xray today .  Follow up with Dr. Lake Bells in 3 months and As needed

## 2017-02-01 ENCOUNTER — Telehealth: Payer: Self-pay | Admitting: Pulmonary Disease

## 2017-02-01 NOTE — Telephone Encounter (Signed)
Pt's daughter, Jan [facility]) is aware of results and voiced her understanding. Nothing further needed.   Notes recorded by Melvenia Needles, NP on 01/31/2017 at 3:48 PM EDT Chronic changes with no sign of PNA .  Cont w/ ov recs Please contact office for sooner follow up if symptoms do not improve or worsen or seek emergency care

## 2017-02-01 NOTE — Progress Notes (Signed)
Reviewed, agree 

## 2017-02-21 DIAGNOSIS — I8311 Varicose veins of right lower extremity with inflammation: Secondary | ICD-10-CM | POA: Diagnosis not present

## 2017-02-21 DIAGNOSIS — L578 Other skin changes due to chronic exposure to nonionizing radiation: Secondary | ICD-10-CM | POA: Diagnosis not present

## 2017-02-21 DIAGNOSIS — L570 Actinic keratosis: Secondary | ICD-10-CM | POA: Diagnosis not present

## 2017-02-21 DIAGNOSIS — I8312 Varicose veins of left lower extremity with inflammation: Secondary | ICD-10-CM | POA: Diagnosis not present

## 2017-04-26 DIAGNOSIS — C50911 Malignant neoplasm of unspecified site of right female breast: Secondary | ICD-10-CM | POA: Diagnosis not present

## 2017-04-26 DIAGNOSIS — R928 Other abnormal and inconclusive findings on diagnostic imaging of breast: Secondary | ICD-10-CM | POA: Diagnosis not present

## 2017-05-23 ENCOUNTER — Ambulatory Visit (INDEPENDENT_AMBULATORY_CARE_PROVIDER_SITE_OTHER): Payer: Medicare Other | Admitting: Pulmonary Disease

## 2017-05-23 ENCOUNTER — Encounter: Payer: Self-pay | Admitting: Pulmonary Disease

## 2017-05-23 ENCOUNTER — Ambulatory Visit: Payer: Medicare Other

## 2017-05-23 VITALS — BP 126/68 | HR 81 | Ht 60.0 in | Wt 134.4 lb

## 2017-05-23 DIAGNOSIS — K219 Gastro-esophageal reflux disease without esophagitis: Secondary | ICD-10-CM

## 2017-05-23 DIAGNOSIS — J45991 Cough variant asthma: Secondary | ICD-10-CM | POA: Diagnosis not present

## 2017-05-23 DIAGNOSIS — J479 Bronchiectasis, uncomplicated: Secondary | ICD-10-CM | POA: Diagnosis not present

## 2017-05-23 MED ORDER — LEVOFLOXACIN 750 MG PO TABS: 750.0000 mg | ORAL_TABLET | Freq: Every day | ORAL | 0 refills | Status: DC
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
## 2017-05-23 NOTE — Patient Instructions (Signed)
For your gastroesophageal reflux disease: Follow the gastroesophageal reflux disease lifestyle modification sheet we gave you Keep taking Prilosec as you're doing Be careful not to eat within 3 hours of bedtime  For your bronchiectasis: Keep taking Striverdi as you are doing Please provide [place] with a sample of your mucus so that we can send it for culture  For your cough: You need to try to suppress your cough to allow your larynx (voice box) to heal.  For three days don't talk, laugh, sing, or clear your throat. Do everything you can to suppress the cough during this time. Use hard candies (sugarless Jolly Ranchers) or non-mint or non-menthol containing cough drops during this time to soothe your throat.  Use a cough suppressant (Delsym or what I have prescribed you) around the clock during this time.  After three days, gradually increase the use of your voice and back off on the cough suppressants.  We will see you back in 6 months or sooner if needed

## 2017-06-01 ENCOUNTER — Other Ambulatory Visit: Payer: Medicare Other

## 2017-06-01 DIAGNOSIS — J479 Bronchiectasis, uncomplicated: Secondary | ICD-10-CM | POA: Diagnosis not present

## 2017-06-02 DIAGNOSIS — H04123 Dry eye syndrome of bilateral lacrimal glands: Secondary | ICD-10-CM | POA: Diagnosis not present

## 2017-06-02 DIAGNOSIS — H353132 Nonexudative age-related macular degeneration, bilateral, intermediate dry stage: Secondary | ICD-10-CM | POA: Diagnosis not present

## 2017-06-02 DIAGNOSIS — H04551 Acquired stenosis of right nasolacrimal duct: Secondary | ICD-10-CM | POA: Diagnosis not present

## 2017-06-02 LAB — RESPIRATORY CULTURE OR RESPIRATORY AND SPUTUM CULTURE: MICRO NUMBER: 80979401

## 2017-06-19 ENCOUNTER — Other Ambulatory Visit: Payer: Self-pay

## 2017-06-19 ENCOUNTER — Telehealth: Payer: Self-pay | Admitting: Pulmonary Disease

## 2017-06-19 DIAGNOSIS — J479 Bronchiectasis, uncomplicated: Secondary | ICD-10-CM

## 2017-06-19 DIAGNOSIS — A310 Pulmonary mycobacterial infection: Secondary | ICD-10-CM

## 2017-06-19 NOTE — Telephone Encounter (Signed)
Spoke with pt's daughter, reviewed again the process for collecting and turning in a sputum sample.  Nothing further needed.

## 2017-06-22 ENCOUNTER — Other Ambulatory Visit: Payer: Medicare Other

## 2017-06-22 DIAGNOSIS — A310 Pulmonary mycobacterial infection: Secondary | ICD-10-CM

## 2017-06-22 DIAGNOSIS — J479 Bronchiectasis, uncomplicated: Secondary | ICD-10-CM

## 2017-06-25 LAB — RESPIRATORY CULTURE OR RESPIRATORY AND SPUTUM CULTURE
MICRO NUMBER:: 81074003
RESULT: NORMAL
SPECIMEN QUALITY:: ADEQUATE

## 2017-06-30 LAB — FUNGUS CULTURE W SMEAR
MICRO NUMBER:: 80979402
SMEAR: NONE SEEN
SPECIMEN QUALITY: ADEQUATE

## 2017-07-05 ENCOUNTER — Other Ambulatory Visit: Payer: Self-pay

## 2017-07-05 ENCOUNTER — Other Ambulatory Visit: Payer: Medicare Other

## 2017-07-05 DIAGNOSIS — J479 Bronchiectasis, uncomplicated: Secondary | ICD-10-CM

## 2017-07-05 LAB — MYCOBACTERIA,CULT W/FLUOROCHROME SMEAR
MICRO NUMBER:: 80979415
SMEAR: NONE SEEN
SPECIMEN QUALITY: ADEQUATE

## 2017-07-09 LAB — RESPIRATORY CULTURE OR RESPIRATORY AND SPUTUM CULTURE
MICRO NUMBER:: 81129412
SPECIMEN QUALITY:: ADEQUATE

## 2017-07-17 DIAGNOSIS — Z23 Encounter for immunization: Secondary | ICD-10-CM | POA: Diagnosis not present

## 2017-07-21 LAB — FUNGUS CULTURE W SMEAR
MICRO NUMBER: 81073984
SMEAR: NONE SEEN
SPECIMEN QUALITY:: ADEQUATE

## 2017-07-24 ENCOUNTER — Telehealth: Payer: Self-pay

## 2017-07-24 NOTE — Telephone Encounter (Signed)
MYCOBACTERIA, CULTURE, WITH FLUOROCHROME SMEAR  Order: [id number]  Status:  Preliminary result Visible to patient:  No (Not Released) Next appt:  08/09/2017 at 04:15 PM in Pulmonology Simonne Maffucci, MD) Dx:  Bronchiectasis without acute exacerba...  Component 2wk ago  MICRO NUMBER: [id number] P   SPECIMEN QUALITY: ADEQUATEP   Source: SPUTUMP   STATUS: PRELIMINARYP   SMEAR: Rare (1 +) acid-fast bacilli seen using the fluorochrome method.P    ISOLATE 1: Mycobacterium, avium-intracellulare group P    Resulting Agency [facility]    Specimen Collected: 07/05/17 10:37 Last Resulted: 07/22/17 15:10            P=Value has a preliminary status         BQ lab called to let you know about this lab.

## 2017-07-31 LAB — MYCOBACTERIA,CULT W/FLUOROCHROME SMEAR
MICRO NUMBER:: 81129417
SPECIMEN QUALITY: ADEQUATE

## 2017-08-03 LAB — FUNGUS CULTURE W SMEAR
MICRO NUMBER: 81129411
SMEAR:: NONE SEEN
SPECIMEN QUALITY: ADEQUATE

## 2017-08-09 ENCOUNTER — Ambulatory Visit (INDEPENDENT_AMBULATORY_CARE_PROVIDER_SITE_OTHER): Payer: Medicare Other | Admitting: Pulmonary Disease

## 2017-08-09 ENCOUNTER — Encounter: Payer: Self-pay | Admitting: Pulmonary Disease

## 2017-08-09 VITALS — BP 116/70 | HR 99 | Ht 60.0 in | Wt 135.0 lb

## 2017-08-09 DIAGNOSIS — A310 Pulmonary mycobacterial infection: Secondary | ICD-10-CM | POA: Diagnosis not present

## 2017-08-09 DIAGNOSIS — J479 Bronchiectasis, uncomplicated: Secondary | ICD-10-CM

## 2017-08-09 DIAGNOSIS — H43813 Vitreous degeneration, bilateral: Secondary | ICD-10-CM | POA: Diagnosis not present

## 2017-08-09 DIAGNOSIS — H5213 Myopia, bilateral: Secondary | ICD-10-CM | POA: Diagnosis not present

## 2017-08-09 DIAGNOSIS — H04222 Epiphora due to insufficient drainage, left lacrimal gland: Secondary | ICD-10-CM | POA: Diagnosis not present

## 2017-08-09 DIAGNOSIS — H04221 Epiphora due to insufficient drainage, right lacrimal gland: Secondary | ICD-10-CM | POA: Diagnosis not present

## 2017-08-09 NOTE — Patient Instructions (Signed)
Bronchiectasis: Keep taking Striverdi daily Let us know if you have fever, increasing shortness of breath, or increasing mucus production I am glad your flu shot is up-to-date Let us know if you need to take Levaquin  Mycobacterium avium intracellular colonization: At this point I do not feel that you are infected If you have weight loss, worsening fever, mucus production or chills call me right away  Not quiteWe will see you back in 6 months or sooner if needed
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
## 2017-08-23 LAB — M. AVIUM MIC PANEL
AMIKACIN: 16
CIPROFLOXACIN: 8
ETHAMBUTOL: 8
ETHIONAMIDE: 20
ISONIAZID: 8
LINEZOLID: 16
RIFABUTIN: 1
RIFAMPIN: 4
STREPTOMYCIN: 32

## 2017-08-23 LAB — MYCOBACTERIA,CULT W/FLUOROCHROME SMEAR
MICRO NUMBER:: 81073997
SPECIMEN QUALITY: ADEQUATE

## 2017-09-11 ENCOUNTER — Telehealth: Payer: Self-pay | Admitting: Pulmonary Disease

## 2017-09-11 NOTE — Telephone Encounter (Signed)
Called and spoke with pt. Pt wanted to make BQ aware that she has started Levaquin on Friday. Pt is experiencing prod cough with tan mucus.  Pt states cough is improving with abx.  Will route to BQ to make aware. Thanks.   Patient Instructions    Bronchiectasis: Keep taking Striverdi daily Let us know if you have fever, increasing shortness of breath, or increasing mucus production I am glad your flu shot is up-to-date Let us know if you need to take Levaquin  Mycobacterium avium intracellular colonization: At this point I do not feel that you are infected If you have weight loss, worsening fever, mucus production or chills call me right away  Not quiteWe will see you back in 6 months or sooner if needed

## 2017-09-12 NOTE — Telephone Encounter (Signed)
OK, please tell her to call us if she is not making improvement by the end of the week.

## 2017-09-12 NOTE — Telephone Encounter (Signed)
Spoke with pt's daughter and advised message from BQ. Nothing further is needed.

## 2017-10-12 ENCOUNTER — Telehealth: Payer: Self-pay | Admitting: Pulmonary Disease

## 2017-10-12 NOTE — Telephone Encounter (Signed)
Form filled out and placed in BQ's folder for signature.

## 2017-10-17 NOTE — Telephone Encounter (Signed)
Awaiting BQ's return to clinic for signature.

## 2017-10-23 NOTE — Telephone Encounter (Signed)
Ashley, please advise on update. Thanks.  

## 2017-10-24 ENCOUNTER — Other Ambulatory Visit: Payer: Self-pay

## 2017-10-24 MED ORDER — OLODATEROL HCL 2.5 MCG/ACT IN AERS: 2.0000 | INHALATION_SPRAY | Freq: Every morning | RESPIRATORY_TRACT | 3 refills | Status: DC

## 2017-10-24 NOTE — Telephone Encounter (Signed)
Form faxed, Jan (daughter) aware.  Copy sent to daughter as requested.  Nothing further needed.

## 2018-01-09 ENCOUNTER — Telehealth: Payer: Self-pay | Admitting: Hematology and Oncology

## 2018-01-09 ENCOUNTER — Inpatient Hospital Stay: Payer: Medicare Other | Attending: Hematology and Oncology | Admitting: Hematology and Oncology

## 2018-01-09 DIAGNOSIS — Z170 Estrogen receptor positive status [ER+]: Secondary | ICD-10-CM

## 2018-01-09 DIAGNOSIS — C50411 Malignant neoplasm of upper-outer quadrant of right female breast: Secondary | ICD-10-CM

## 2018-01-09 DIAGNOSIS — M810 Age-related osteoporosis without current pathological fracture: Secondary | ICD-10-CM | POA: Diagnosis not present

## 2018-01-09 DIAGNOSIS — Z853 Personal history of malignant neoplasm of breast: Secondary | ICD-10-CM | POA: Diagnosis not present

## 2018-01-09 NOTE — Assessment & Plan Note (Signed)
Right lumpectomy 05/03/2016: IDC grade 2, 1.3 cm, DCIS with calcifications and necrosis, lymphovascular invasion is identified, margins negative, 0/3 lymph nodes negative, ER 95%, PR 60%, HER-2 negative, Ki-67 20%, T1 cN0 stage IA  Treatment plan: 1. Patient did not want to go through adjuvant radiation therapy 2. Startedantiestrogen therapy with tamoxifen (prior diagnosis of osteoporosis) on 05/12/2016 discontinued December 2017 due to hot flashes  Patient does not want to go on any further antiestrogen therapy.  Surveillance: 1.  Breast exam 01/09/2018: Benign 2. mammogram: I discussed the pros and cons of doing mammograms.  Given her advanced age, patient decided to not do any further mammograms.  Return to clinic in 1 year for follow-up with Mendel Ryder with long-term survivorship care

## 2018-01-09 NOTE — Progress Notes (Signed)
Patient Care Team: [name], MD as PCP - General (Internal Medicine) Jovita Kussmaul, MD as Consulting Physician (General Surgery) Nicholas Lose, MD as Consulting Physician (Hematology and Oncology) Gery Pray, MD as Consulting Physician (Radiation Oncology)  DIAGNOSIS:  Encounter Diagnosis  Name Primary?  . Malignant neoplasm of upper-outer quadrant of right breast in female, estrogen receptor positive ([place])     SUMMARY OF ONCOLOGIC HISTORY:   Breast cancer of upper-outer quadrant of right female breast ([place])   04/05/2016 Initial Diagnosis    Right breast biopsy 12:00:IDC, LVI present, grade 2-3, ER 95%, PR 60%, HER-2 negative ratio 1.42, Ki-67 20%, screening detected right breast mass 7 mm axilla negative,T1bN0 stage IA clinical stage      05/03/2016 Surgery    Right lumpectomy: IDC grade 2, 1.3 cm, DCIS with calcifications and necrosis, lymphovascular invasion is identified, margins negative, 0/3 lymph nodes negative, ER 95%, PR 60%, HER-2 negative, Ki-67 20%, T1 cN0 stage IA      05/23/2016 - 09/10/2016 Anti-estrogen oral therapy    Tamoxifen 20 mg daily, stopped in December 2017 due to hot flashes patient refused to try any other medication because of osteoporosis      06/20/2016 Genetic Testing    CHEK2 positive. Genes tested include: ATM, BARD1, BRCA1, BRCA2, BRIP1, CDH1, CHEK2, EPCAM, FANCC, MLH1, MSH2, MSH6, NBN, PALB2, PMS2, PTEN, RAD51C, RAD51D, TP53, and XRCC2.          CHIEF COMPLIANT: Surveillance of breast cancer  INTERVAL HISTORY: Carla [name] is a 84 year old with above-mentioned history of right breast cancer treated with lumpectomy and did not want to undergo any adjuvant radiation or tamoxifen therapy.  She is currently in surveillance.  She denies any lumps or nodules in the breast.  REVIEW OF SYSTEMS:   Constitutional: Denies fevers, chills or abnormal weight loss Eyes: Denies blurriness of vision Ears, nose, mouth, throat, and face: Denies  mucositis or sore throat Respiratory: Denies cough, dyspnea or wheezes Cardiovascular: Denies palpitation, chest discomfort Gastrointestinal:  Denies nausea, heartburn or change in bowel habits Skin: Denies abnormal skin rashes Lymphatics: Denies new lymphadenopathy or easy bruising Neurological:Denies numbness, tingling or new weaknesses Behavioral/Psych: Mood is stable, no new changes  Extremities: No lower extremity edema Breast:  denies any pain or lumps or nodules in either breasts All other systems were reviewed with the patient and are negative.  I have reviewed the past medical history, past surgical history, social history and family history with the patient and they are unchanged from previous note.  ALLERGIES:  has No Known Allergies.  MEDICATIONS:  Current Outpatient Medications  Medication Sig Dispense Refill  . albuterol (PROVENTIL HFA;VENTOLIN HFA) 108 (90 BASE) MCG/ACT inhaler Inhale 2 puffs into the lungs every 6 (six) hours as needed for wheezing or shortness of breath. 3.7 g 5  . aspirin EC 81 MG tablet Take 81 mg by mouth daily.    . Biotin 10 MG TABS Take 10 mg by mouth daily.    . cetirizine (ZYRTEC) 10 MG tablet Take 10 mg by mouth daily.    . diazepam (VALIUM) 5 MG tablet Take 1 tablet (5 mg total) by mouth every 6 (six) hours as needed for anxiety. 60 tablet 0  . diclofenac sodium (VOLTAREN) 1 % GEL Apply 2 g topically as needed (pain).     . fluticasone (FLONASE) 50 MCG/ACT nasal spray Place 1 spray into both nostrils daily as needed for allergies. 48 g 3  . levofloxacin (LEVAQUIN) 750 MG tablet  Take 1 tablet (750 mg total) by mouth daily. (Patient not taking: Reported on 08/09/2017) 7 tablet 0  . lidocaine (LIDODERM) 5 % Place 1 patch onto the skin as needed. Remove & Discard patch within 12 hours or as directed by MD    . losartan-hydrochlorothiazide (HYZAAR) 50-12.5 MG per tablet Take 0.5 tablets by mouth every morning.     . Multiple Vitamins-Minerals  (OCUVITE PRESERVISION PO) Take 1 tablet by mouth 2 (two) times daily.    . Olodaterol HCl (STRIVERDI RESPIMAT) 2.5 MCG/ACT AERS Inhale 2 puffs into the lungs every morning. 3 Inhaler 3  . omeprazole (PRILOSEC) 40 MG capsule Take 40 mg by mouth 2 (two) times daily.      No current facility-administered medications for this visit.     PHYSICAL EXAMINATION: ECOG PERFORMANCE STATUS: 1 - Symptomatic but completely ambulatory  Vitals:   01/09/18 1347  BP: 123/66  Pulse: 86  Resp: 17  Temp: 97.7 F (36.5 C)  SpO2: 93%   Filed Weights   01/09/18 1347  Weight: 136 lb 4.8 oz (61.8 kg)    GENERAL:alert, no distress and comfortable SKIN: skin color, texture, turgor are normal, no rashes or significant lesions EYES: normal, Conjunctiva are pink and non-injected, sclera clear OROPHARYNX:no exudate, no erythema and lips, buccal mucosa, and tongue normal  NECK: supple, thyroid normal size, non-tender, without nodularity LYMPH:  no palpable lymphadenopathy in the cervical, axillary or inguinal LUNGS: clear to auscultation and percussion with normal breathing effort HEART: regular rate & rhythm and no murmurs and no lower extremity edema ABDOMEN:abdomen soft, non-tender and normal bowel sounds MUSCULOSKELETAL:no cyanosis of digits and no clubbing  NEURO: alert & oriented x 3 with fluent speech, no focal motor/sensory deficits EXTREMITIES: No lower extremity edema BREAST: No palpable masses or nodules in either right or left breasts. No palpable axillary supraclavicular or infraclavicular adenopathy no breast tenderness or nipple discharge. (exam performed in the presence of a chaperone)  LABORATORY DATA:  I have reviewed the data as listed CMP Latest Ref Rng & Units 06/20/2016 06/10/2016 04/28/2016  Glucose 70 - 140 mg/dl 91 102(H) 111(H)  BUN 7.0 - 26.0 mg/dL 11.9 21(H) 13  Creatinine 0.6 - 1.1 mg/dL 0.7 0.70 0.76  Sodium 136 - 145 mEq/L 144 140 141  Potassium 3.5 - 5.1 mEq/L 3.8 3.6 3.6    Chloride 101 - 111 mmol/L - 105 106  CO2 22 - 29 mEq/L _0 Calcium 8.4 - 10.4 mg/dL 9.2 9.0 9.2  Total Protein 6.4 - 8.3 g/dL 6.5 6.6 -  Total Bilirubin 0.20 - 1.20 mg/dL 0.39 0.3 -  Alkaline Phos 40 - 150 U/L 84 78 -  AST 5 - 34 U/L 17 22 -  ALT 0 - 55 U/L 15 16 -    Lab Results  Component Value Date   WBC 9.0 06/20/2016   HGB 12.6 06/20/2016   HCT 38.3 06/20/2016   MCV 98.5 06/20/2016   PLT 401 (H) 06/20/2016   NEUTROABS 5.8 06/20/2016    ASSESSMENT & PLAN:  Breast cancer of upper-outer quadrant of right female breast ([place]) Right lumpectomy 05/03/2016: IDC grade 2, 1.3 cm, DCIS with calcifications and necrosis, lymphovascular invasion is identified, margins negative, 0/3 lymph nodes negative, ER 95%, PR 60%, HER-2 negative, Ki-67 20%, T1 cN0 stage IA CHEK-2 mutation  Treatment plan: 1. Patient did not want to go through adjuvant radiation therapy 2. Startedantiestrogen therapy with tamoxifen (prior diagnosis of osteoporosis) on 05/12/2016 discontinued December 2017 due  to hot flashes  Patient does not want to go on any further antiestrogen therapy. Left breast tenderness: No palpable lumps or nodules. Skin lesions: I instructed her to see dermatology.  Surveillance: 1.  Breast exam 01/09/2018: Benign 2. mammogram: Patient gets her mammograms at [facility].  She will get the mammogram in July.  Return to clinic in 1 year for follow-up    Orders Placed This Encounter  Procedures  . MM DIAG BREAST TOMO BILATERAL    Standing Status:   Future    Standing Expiration Date:   01/10/2019    Order Specific Question:   Reason for Exam (SYMPTOM  OR DIAGNOSIS REQUIRED)    Answer:   Annual mammograms    Order Specific Question:   Preferred imaging location?    Answer:   External    Comments:   [facility]   The patient has a good understanding of the overall plan. she agrees with it. she will call with any problems that may develop  before the next visit here.   Harriette Ohara, MD 01/09/18

## 2018-01-09 NOTE — Telephone Encounter (Signed)
Gave avs and calendar ° °

## 2018-01-22 DIAGNOSIS — R82998 Other abnormal findings in urine: Secondary | ICD-10-CM | POA: Diagnosis not present

## 2018-01-22 DIAGNOSIS — I10 Essential (primary) hypertension: Secondary | ICD-10-CM | POA: Diagnosis not present

## 2018-01-26 DIAGNOSIS — Z0000 Encounter for general adult medical examination without abnormal findings: Secondary | ICD-10-CM | POA: Diagnosis not present

## 2018-01-26 DIAGNOSIS — C50919 Malignant neoplasm of unspecified site of unspecified female breast: Secondary | ICD-10-CM | POA: Diagnosis not present

## 2018-01-26 DIAGNOSIS — A310 Pulmonary mycobacterial infection: Secondary | ICD-10-CM | POA: Diagnosis not present

## 2018-01-26 DIAGNOSIS — D508 Other iron deficiency anemias: Secondary | ICD-10-CM | POA: Diagnosis not present

## 2018-01-26 DIAGNOSIS — F418 Other specified anxiety disorders: Secondary | ICD-10-CM | POA: Diagnosis not present

## 2018-01-26 DIAGNOSIS — K219 Gastro-esophageal reflux disease without esophagitis: Secondary | ICD-10-CM | POA: Diagnosis not present

## 2018-01-26 DIAGNOSIS — Z1212 Encounter for screening for malignant neoplasm of rectum: Secondary | ICD-10-CM | POA: Diagnosis not present

## 2018-01-26 DIAGNOSIS — E46 Unspecified protein-calorie malnutrition: Secondary | ICD-10-CM | POA: Diagnosis not present

## 2018-01-26 DIAGNOSIS — Z6825 Body mass index (BMI) 25.0-25.9, adult: Secondary | ICD-10-CM | POA: Diagnosis not present

## 2018-01-26 DIAGNOSIS — D539 Nutritional anemia, unspecified: Secondary | ICD-10-CM | POA: Diagnosis not present

## 2018-01-26 DIAGNOSIS — R808 Other proteinuria: Secondary | ICD-10-CM | POA: Diagnosis not present

## 2018-01-26 DIAGNOSIS — Z1389 Encounter for screening for other disorder: Secondary | ICD-10-CM | POA: Diagnosis not present

## 2018-01-26 DIAGNOSIS — A312 Disseminated mycobacterium avium-intracellulare complex (DMAC): Secondary | ICD-10-CM | POA: Diagnosis not present

## 2018-01-26 DIAGNOSIS — I8390 Asymptomatic varicose veins of unspecified lower extremity: Secondary | ICD-10-CM | POA: Diagnosis not present

## 2018-02-05 DIAGNOSIS — A932 Colorado tick fever: Secondary | ICD-10-CM | POA: Diagnosis not present

## 2018-02-05 DIAGNOSIS — S30860A Insect bite (nonvenomous) of lower back and pelvis, initial encounter: Secondary | ICD-10-CM | POA: Diagnosis not present

## 2018-03-06 DIAGNOSIS — L820 Inflamed seborrheic keratosis: Secondary | ICD-10-CM | POA: Diagnosis not present

## 2018-03-06 DIAGNOSIS — L821 Other seborrheic keratosis: Secondary | ICD-10-CM | POA: Diagnosis not present

## 2018-03-06 DIAGNOSIS — L570 Actinic keratosis: Secondary | ICD-10-CM | POA: Diagnosis not present

## 2018-04-30 DIAGNOSIS — R928 Other abnormal and inconclusive findings on diagnostic imaging of breast: Secondary | ICD-10-CM | POA: Diagnosis not present

## 2018-04-30 DIAGNOSIS — Z853 Personal history of malignant neoplasm of breast: Secondary | ICD-10-CM | POA: Diagnosis not present

## 2018-05-13 DIAGNOSIS — J45909 Unspecified asthma, uncomplicated: Secondary | ICD-10-CM | POA: Diagnosis not present

## 2018-05-13 DIAGNOSIS — R0902 Hypoxemia: Secondary | ICD-10-CM | POA: Diagnosis not present

## 2018-05-13 DIAGNOSIS — A419 Sepsis, unspecified organism: Secondary | ICD-10-CM | POA: Diagnosis not present

## 2018-05-13 DIAGNOSIS — R0602 Shortness of breath: Secondary | ICD-10-CM | POA: Diagnosis not present

## 2018-05-13 DIAGNOSIS — R531 Weakness: Secondary | ICD-10-CM | POA: Diagnosis not present

## 2018-05-13 DIAGNOSIS — M549 Dorsalgia, unspecified: Secondary | ICD-10-CM | POA: Diagnosis not present

## 2018-05-13 DIAGNOSIS — N390 Urinary tract infection, site not specified: Secondary | ICD-10-CM | POA: Diagnosis not present

## 2018-05-13 DIAGNOSIS — R197 Diarrhea, unspecified: Secondary | ICD-10-CM | POA: Diagnosis not present

## 2018-05-13 DIAGNOSIS — R000 Tachycardia, unspecified: Secondary | ICD-10-CM | POA: Diagnosis not present

## 2018-05-13 DIAGNOSIS — A4151 Sepsis due to Escherichia coli [E. coli]: Secondary | ICD-10-CM | POA: Diagnosis not present

## 2018-05-13 DIAGNOSIS — N3001 Acute cystitis with hematuria: Secondary | ICD-10-CM | POA: Diagnosis not present

## 2018-05-13 DIAGNOSIS — R1032 Left lower quadrant pain: Secondary | ICD-10-CM | POA: Diagnosis not present

## 2018-05-13 DIAGNOSIS — R509 Fever, unspecified: Secondary | ICD-10-CM | POA: Diagnosis not present

## 2018-05-13 DIAGNOSIS — B9620 Unspecified Escherichia coli [E. coli] as the cause of diseases classified elsewhere: Secondary | ICD-10-CM | POA: Diagnosis not present

## 2018-05-13 DIAGNOSIS — R1111 Vomiting without nausea: Secondary | ICD-10-CM | POA: Diagnosis not present

## 2018-05-13 DIAGNOSIS — B9689 Other specified bacterial agents as the cause of diseases classified elsewhere: Secondary | ICD-10-CM | POA: Diagnosis not present

## 2018-05-13 DIAGNOSIS — I10 Essential (primary) hypertension: Secondary | ICD-10-CM | POA: Diagnosis not present

## 2018-05-13 DIAGNOSIS — K219 Gastro-esophageal reflux disease without esophagitis: Secondary | ICD-10-CM | POA: Diagnosis not present

## 2018-05-14 DIAGNOSIS — R0602 Shortness of breath: Secondary | ICD-10-CM | POA: Diagnosis not present

## 2018-05-14 DIAGNOSIS — Z90710 Acquired absence of both cervix and uterus: Secondary | ICD-10-CM | POA: Diagnosis not present

## 2018-05-14 DIAGNOSIS — M6281 Muscle weakness (generalized): Secondary | ICD-10-CM | POA: Diagnosis not present

## 2018-05-14 DIAGNOSIS — B9689 Other specified bacterial agents as the cause of diseases classified elsewhere: Secondary | ICD-10-CM | POA: Diagnosis present

## 2018-05-14 DIAGNOSIS — Z7401 Bed confinement status: Secondary | ICD-10-CM | POA: Diagnosis not present

## 2018-05-14 DIAGNOSIS — J309 Allergic rhinitis, unspecified: Secondary | ICD-10-CM | POA: Diagnosis not present

## 2018-05-14 DIAGNOSIS — T7840XD Allergy, unspecified, subsequent encounter: Secondary | ICD-10-CM | POA: Diagnosis not present

## 2018-05-14 DIAGNOSIS — A419 Sepsis, unspecified organism: Secondary | ICD-10-CM | POA: Diagnosis not present

## 2018-05-14 DIAGNOSIS — H3530 Unspecified macular degeneration: Secondary | ICD-10-CM | POA: Diagnosis not present

## 2018-05-14 DIAGNOSIS — R002 Palpitations: Secondary | ICD-10-CM | POA: Diagnosis not present

## 2018-05-14 DIAGNOSIS — R5381 Other malaise: Secondary | ICD-10-CM | POA: Diagnosis not present

## 2018-05-14 DIAGNOSIS — N390 Urinary tract infection, site not specified: Secondary | ICD-10-CM | POA: Diagnosis present

## 2018-05-14 DIAGNOSIS — R000 Tachycardia, unspecified: Secondary | ICD-10-CM | POA: Diagnosis not present

## 2018-05-14 DIAGNOSIS — R0902 Hypoxemia: Secondary | ICD-10-CM | POA: Diagnosis not present

## 2018-05-14 DIAGNOSIS — M2550 Pain in unspecified joint: Secondary | ICD-10-CM | POA: Diagnosis not present

## 2018-05-14 DIAGNOSIS — J45909 Unspecified asthma, uncomplicated: Secondary | ICD-10-CM | POA: Diagnosis present

## 2018-05-14 DIAGNOSIS — J479 Bronchiectasis, uncomplicated: Secondary | ICD-10-CM | POA: Diagnosis not present

## 2018-05-14 DIAGNOSIS — Z9981 Dependence on supplemental oxygen: Secondary | ICD-10-CM | POA: Diagnosis not present

## 2018-05-14 DIAGNOSIS — R278 Other lack of coordination: Secondary | ICD-10-CM | POA: Diagnosis not present

## 2018-05-14 DIAGNOSIS — H9193 Unspecified hearing loss, bilateral: Secondary | ICD-10-CM | POA: Diagnosis not present

## 2018-05-14 DIAGNOSIS — R9431 Abnormal electrocardiogram [ECG] [EKG]: Secondary | ICD-10-CM | POA: Diagnosis not present

## 2018-05-14 DIAGNOSIS — R41841 Cognitive communication deficit: Secondary | ICD-10-CM | POA: Diagnosis not present

## 2018-05-14 DIAGNOSIS — R2681 Unsteadiness on feet: Secondary | ICD-10-CM | POA: Diagnosis not present

## 2018-05-14 DIAGNOSIS — I491 Atrial premature depolarization: Secondary | ICD-10-CM | POA: Diagnosis not present

## 2018-05-14 DIAGNOSIS — R531 Weakness: Secondary | ICD-10-CM | POA: Diagnosis not present

## 2018-05-14 DIAGNOSIS — K219 Gastro-esophageal reflux disease without esophagitis: Secondary | ICD-10-CM | POA: Diagnosis present

## 2018-05-14 DIAGNOSIS — J9600 Acute respiratory failure, unspecified whether with hypoxia or hypercapnia: Secondary | ICD-10-CM | POA: Diagnosis not present

## 2018-05-14 DIAGNOSIS — E876 Hypokalemia: Secondary | ICD-10-CM | POA: Diagnosis not present

## 2018-05-14 DIAGNOSIS — R197 Diarrhea, unspecified: Secondary | ICD-10-CM | POA: Diagnosis not present

## 2018-05-14 DIAGNOSIS — I10 Essential (primary) hypertension: Secondary | ICD-10-CM | POA: Diagnosis present

## 2018-05-14 DIAGNOSIS — A4151 Sepsis due to Escherichia coli [E. coli]: Secondary | ICD-10-CM | POA: Diagnosis present

## 2018-05-15 ENCOUNTER — Ambulatory Visit: Payer: Medicare Other | Admitting: Pulmonary Disease

## 2018-05-16 ENCOUNTER — Ambulatory Visit: Payer: Medicare Other | Admitting: Pulmonary Disease

## 2018-05-17 DIAGNOSIS — M2550 Pain in unspecified joint: Secondary | ICD-10-CM | POA: Diagnosis not present

## 2018-05-17 DIAGNOSIS — R2681 Unsteadiness on feet: Secondary | ICD-10-CM | POA: Diagnosis not present

## 2018-05-17 DIAGNOSIS — R0602 Shortness of breath: Secondary | ICD-10-CM | POA: Diagnosis not present

## 2018-05-17 DIAGNOSIS — A419 Sepsis, unspecified organism: Secondary | ICD-10-CM | POA: Diagnosis not present

## 2018-05-17 DIAGNOSIS — M6281 Muscle weakness (generalized): Secondary | ICD-10-CM | POA: Diagnosis not present

## 2018-05-17 DIAGNOSIS — H9193 Unspecified hearing loss, bilateral: Secondary | ICD-10-CM | POA: Diagnosis not present

## 2018-05-17 DIAGNOSIS — R278 Other lack of coordination: Secondary | ICD-10-CM | POA: Diagnosis not present

## 2018-05-17 DIAGNOSIS — Z7401 Bed confinement status: Secondary | ICD-10-CM | POA: Diagnosis not present

## 2018-05-17 DIAGNOSIS — R0902 Hypoxemia: Secondary | ICD-10-CM | POA: Diagnosis not present

## 2018-05-17 DIAGNOSIS — N390 Urinary tract infection, site not specified: Secondary | ICD-10-CM | POA: Diagnosis not present

## 2018-05-17 DIAGNOSIS — R197 Diarrhea, unspecified: Secondary | ICD-10-CM | POA: Diagnosis not present

## 2018-05-17 DIAGNOSIS — R41841 Cognitive communication deficit: Secondary | ICD-10-CM | POA: Diagnosis not present

## 2018-05-17 DIAGNOSIS — J479 Bronchiectasis, uncomplicated: Secondary | ICD-10-CM | POA: Diagnosis not present

## 2018-05-17 DIAGNOSIS — J9600 Acute respiratory failure, unspecified whether with hypoxia or hypercapnia: Secondary | ICD-10-CM | POA: Diagnosis not present

## 2018-05-17 DIAGNOSIS — K219 Gastro-esophageal reflux disease without esophagitis: Secondary | ICD-10-CM | POA: Diagnosis not present

## 2018-05-17 DIAGNOSIS — J45909 Unspecified asthma, uncomplicated: Secondary | ICD-10-CM | POA: Diagnosis not present

## 2018-05-17 DIAGNOSIS — H3530 Unspecified macular degeneration: Secondary | ICD-10-CM | POA: Diagnosis not present

## 2018-05-17 DIAGNOSIS — A4151 Sepsis due to Escherichia coli [E. coli]: Secondary | ICD-10-CM | POA: Diagnosis not present

## 2018-05-17 DIAGNOSIS — T7840XD Allergy, unspecified, subsequent encounter: Secondary | ICD-10-CM | POA: Diagnosis not present

## 2018-05-17 DIAGNOSIS — J309 Allergic rhinitis, unspecified: Secondary | ICD-10-CM | POA: Diagnosis not present

## 2018-05-17 DIAGNOSIS — R002 Palpitations: Secondary | ICD-10-CM | POA: Diagnosis not present

## 2018-05-17 DIAGNOSIS — G4709 Other insomnia: Secondary | ICD-10-CM | POA: Diagnosis not present

## 2018-05-17 DIAGNOSIS — R531 Weakness: Secondary | ICD-10-CM | POA: Diagnosis not present

## 2018-05-17 DIAGNOSIS — R5381 Other malaise: Secondary | ICD-10-CM | POA: Diagnosis not present

## 2018-05-17 DIAGNOSIS — I10 Essential (primary) hypertension: Secondary | ICD-10-CM | POA: Diagnosis not present

## 2018-05-17 DIAGNOSIS — Z9981 Dependence on supplemental oxygen: Secondary | ICD-10-CM | POA: Diagnosis not present

## 2018-05-23 DIAGNOSIS — A4151 Sepsis due to Escherichia coli [E. coli]: Secondary | ICD-10-CM | POA: Diagnosis not present

## 2018-05-23 DIAGNOSIS — R002 Palpitations: Secondary | ICD-10-CM | POA: Diagnosis not present

## 2018-05-23 DIAGNOSIS — G4709 Other insomnia: Secondary | ICD-10-CM | POA: Diagnosis not present

## 2018-05-23 DIAGNOSIS — J479 Bronchiectasis, uncomplicated: Secondary | ICD-10-CM | POA: Diagnosis not present

## 2018-05-23 DIAGNOSIS — I10 Essential (primary) hypertension: Secondary | ICD-10-CM | POA: Diagnosis not present

## 2018-05-23 DIAGNOSIS — M6281 Muscle weakness (generalized): Secondary | ICD-10-CM | POA: Diagnosis not present

## 2018-05-24 ENCOUNTER — Other Ambulatory Visit: Payer: Self-pay | Admitting: *Deleted

## 2018-05-24 NOTE — Patient Outreach (Signed)
[facility]) Care Management  05/24/2018  Carla [name] 18-Jun-1926 [id number]   Met with discharge planner Ebony Hail at [facility] in [place] regarding patient. Patient previously lived alone with several caregivers who would check in on her during the week. She has a daughter who is active in her care. Patient is doing well but still working on safety awareness issues. No discharge date set.  Attempted to see patient at the bedside but patient was actively participating in therapy. Will plan to see patient next week.  Collaborated with [facility] UM team member Jari Pigg who was visiting [employer].   Rutherford Limerick RN, BSN [place] Acute Care Coordinator [phone]) Business Mobile [phone]) Toll free office

## 2018-05-31 ENCOUNTER — Other Ambulatory Visit: Payer: Self-pay | Admitting: *Deleted

## 2018-05-31 NOTE — Patient Outreach (Signed)
[facility]) Care Management  05/31/2018  Carla [name] [birth year] [id number]   Met with discharge planning team at [facility] at [facility]. Team included nursing, discharge planner, PT, admissions coordinator and administration.  Discharge planner stated patient planned to discharge tomorrow 06/01/18. PT stated patient doing well with therapy and feels patient is definitely ready to go home.   Saw patient ambulating in the hallway with walker.   Met with patient after her therapy at her bedside.  Patient confirmed discharge date and stated she would leave after lunch. Patient stated she lives alone. Patient stated she goes to [employer] daily.  Patient stated she uses [employer] for transportation.  Patient stated she has two daughters who take turns checking in on her.  [facility] Management services and patient agreed to speak with a nurse for transition of care.  Patient stated she was admitted to the hospital with a UTI and it had caused her to become extremely weak. She stated she was feeling much better.  Patient stated at this time her MD was assisting her to obtain her inhaler.   Co-visit with [facility] UM Team Member [place].  Referral sent for [place] to reach out for transition of care.   Rutherford Limerick RN, BSN [place] Acute Care Coordinator [phone]) Business Mobile [phone]) Toll free office

## 2018-06-02 DIAGNOSIS — J479 Bronchiectasis, uncomplicated: Secondary | ICD-10-CM | POA: Diagnosis not present

## 2018-06-02 DIAGNOSIS — I10 Essential (primary) hypertension: Secondary | ICD-10-CM | POA: Diagnosis not present

## 2018-06-02 DIAGNOSIS — H3530 Unspecified macular degeneration: Secondary | ICD-10-CM | POA: Diagnosis not present

## 2018-06-02 DIAGNOSIS — A4151 Sepsis due to Escherichia coli [E. coli]: Secondary | ICD-10-CM | POA: Diagnosis not present

## 2018-06-02 DIAGNOSIS — Z7982 Long term (current) use of aspirin: Secondary | ICD-10-CM | POA: Diagnosis not present

## 2018-06-02 DIAGNOSIS — K219 Gastro-esophageal reflux disease without esophagitis: Secondary | ICD-10-CM | POA: Diagnosis not present

## 2018-06-02 DIAGNOSIS — G4709 Other insomnia: Secondary | ICD-10-CM | POA: Diagnosis not present

## 2018-06-02 DIAGNOSIS — Z9981 Dependence on supplemental oxygen: Secondary | ICD-10-CM | POA: Diagnosis not present

## 2018-06-02 DIAGNOSIS — N390 Urinary tract infection, site not specified: Secondary | ICD-10-CM | POA: Diagnosis not present

## 2018-06-02 DIAGNOSIS — Z853 Personal history of malignant neoplasm of breast: Secondary | ICD-10-CM | POA: Diagnosis not present

## 2018-06-02 DIAGNOSIS — R911 Solitary pulmonary nodule: Secondary | ICD-10-CM | POA: Diagnosis not present

## 2018-06-02 DIAGNOSIS — H9193 Unspecified hearing loss, bilateral: Secondary | ICD-10-CM | POA: Diagnosis not present

## 2018-06-04 DIAGNOSIS — N390 Urinary tract infection, site not specified: Secondary | ICD-10-CM | POA: Diagnosis not present

## 2018-06-04 DIAGNOSIS — A4151 Sepsis due to Escherichia coli [E. coli]: Secondary | ICD-10-CM | POA: Diagnosis not present

## 2018-06-05 ENCOUNTER — Other Ambulatory Visit: Payer: Self-pay

## 2018-06-05 DIAGNOSIS — Z6825 Body mass index (BMI) 25.0-25.9, adult: Secondary | ICD-10-CM | POA: Diagnosis not present

## 2018-06-05 DIAGNOSIS — M1990 Unspecified osteoarthritis, unspecified site: Secondary | ICD-10-CM | POA: Diagnosis not present

## 2018-06-05 DIAGNOSIS — F418 Other specified anxiety disorders: Secondary | ICD-10-CM | POA: Diagnosis not present

## 2018-06-05 DIAGNOSIS — A4151 Sepsis due to Escherichia coli [E. coli]: Secondary | ICD-10-CM | POA: Diagnosis not present

## 2018-06-05 DIAGNOSIS — I10 Essential (primary) hypertension: Secondary | ICD-10-CM | POA: Diagnosis not present

## 2018-06-05 DIAGNOSIS — K219 Gastro-esophageal reflux disease without esophagitis: Secondary | ICD-10-CM | POA: Diagnosis not present

## 2018-06-05 DIAGNOSIS — R5381 Other malaise: Secondary | ICD-10-CM | POA: Diagnosis not present

## 2018-06-05 NOTE — Patient Outreach (Signed)
Transition of care: Discharged from [facility] on 06/02/2018  Placed call to patient. No answer.   PLAN: will send outreach letter and attempt again.  Tomasa Rand, RN, BSN, CEN [facility] ([facility]) [employer] [phone]

## 2018-06-06 DIAGNOSIS — N390 Urinary tract infection, site not specified: Secondary | ICD-10-CM | POA: Diagnosis not present

## 2018-06-06 DIAGNOSIS — A4151 Sepsis due to Escherichia coli [E. coli]: Secondary | ICD-10-CM | POA: Diagnosis not present

## 2018-06-08 ENCOUNTER — Other Ambulatory Visit: Payer: Self-pay

## 2018-06-08 NOTE — Patient Outreach (Signed)
Transition of care:  Placed call to patient and explained reason for call. Patient reports that she is feeling stronger and home health is active.   Reports she has all her medications and is taking them as prescribed.  Reports she has already seen her primary MD.  Patient having a hard time hearing on the phone.  PLAN: offered home visit to assess needs and establish care plan. Patient agreed to home visit on 06/14/2018. Confirmed address. Provided contact number   Tomasa Rand, RN, BSN, CEN [place] Coordinator [phone]

## 2018-06-11 DIAGNOSIS — A4151 Sepsis due to Escherichia coli [E. coli]: Secondary | ICD-10-CM | POA: Diagnosis not present

## 2018-06-11 DIAGNOSIS — N390 Urinary tract infection, site not specified: Secondary | ICD-10-CM | POA: Diagnosis not present

## 2018-06-12 ENCOUNTER — Telehealth: Payer: Self-pay

## 2018-06-12 DIAGNOSIS — A4151 Sepsis due to Escherichia coli [E. coli]: Secondary | ICD-10-CM | POA: Diagnosis not present

## 2018-06-12 DIAGNOSIS — N390 Urinary tract infection, site not specified: Secondary | ICD-10-CM | POA: Diagnosis not present

## 2018-06-12 NOTE — Patient Outreach (Signed)
Spoke with patients daughter after verifying HIPAA compliant information.  Daughter states she was unaware of [facility] program. CMA explained patient was referred by PCP and has upcoming appointment on 06/14/18 with community RN.  Daughter would like call to be documented for future reference for when she calls back now that she is aware of what we do and feels we could benefit her mother.  CMA will send message to RN to follow up on call.  [place] Management Assistant

## 2018-06-13 ENCOUNTER — Ambulatory Visit (INDEPENDENT_AMBULATORY_CARE_PROVIDER_SITE_OTHER): Payer: Medicare Other | Admitting: Pulmonary Disease

## 2018-06-13 ENCOUNTER — Other Ambulatory Visit: Payer: Self-pay

## 2018-06-13 ENCOUNTER — Encounter: Payer: Self-pay | Admitting: Pulmonary Disease

## 2018-06-13 VITALS — BP 138/78 | HR 107 | Ht 60.43 in | Wt 127.8 lb

## 2018-06-13 DIAGNOSIS — A310 Pulmonary mycobacterial infection: Secondary | ICD-10-CM

## 2018-06-13 DIAGNOSIS — J479 Bronchiectasis, uncomplicated: Secondary | ICD-10-CM

## 2018-06-13 DIAGNOSIS — Z23 Encounter for immunization: Secondary | ICD-10-CM

## 2018-06-13 MED ORDER — LEVOFLOXACIN 500 MG PO TABS: 500.0000 mg | ORAL_TABLET | Freq: Every day | ORAL | 0 refills | Status: AC

## 2018-06-13 NOTE — Patient Instructions (Signed)
Keep taking Striverdi daily. Use Mucinex over-the-counter (guaifenesin) twice a day for the next week Use a flutter valve 4 to 5 breaths, 4-5 times a day High-dose flu shot today Practice good hand hygiene Call us if you have to take the emergency prescription for the antibiotic we gave you (Levaquin)  History of Mycobacterium avium colonization: Let me know if you have weight loss, fever, or worsening shortness of breath  We will plan on seeing you back in 3 to 4 months or sooner if needed
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
## 2018-06-13 NOTE — Patient Outreach (Signed)
Case closure:  In basket message received from [facility] office stating that patients daughter called and wanted her case closed and to cancel home visit.  PLAN: will send update to MD and close case.  Tomasa Rand, RN, BSN, CEN [facility] [employer] [phone]

## 2018-06-13 NOTE — Patient Outreach (Signed)
Care Coordination:  Received in basket message stating daughter called to inquire about [facility] program.  Placed call back to daughter and reviewed program.  Explained about home visit planned for tomorrow.   PLAN: Daughter wishes to talk to there mother later today and call me back to confirm or decline services.   Tomasa Rand, RN, BSN, CEN [facility] [employer] [phone]

## 2018-06-14 ENCOUNTER — Ambulatory Visit: Payer: Medicare Other

## 2018-06-14 DIAGNOSIS — N390 Urinary tract infection, site not specified: Secondary | ICD-10-CM | POA: Diagnosis not present

## 2018-06-14 DIAGNOSIS — A4151 Sepsis due to Escherichia coli [E. coli]: Secondary | ICD-10-CM | POA: Diagnosis not present

## 2018-06-18 DIAGNOSIS — N390 Urinary tract infection, site not specified: Secondary | ICD-10-CM | POA: Diagnosis not present

## 2018-06-18 DIAGNOSIS — A4151 Sepsis due to Escherichia coli [E. coli]: Secondary | ICD-10-CM | POA: Diagnosis not present

## 2018-08-01 DIAGNOSIS — A312 Disseminated mycobacterium avium-intracellulare complex (DMAC): Secondary | ICD-10-CM | POA: Diagnosis not present

## 2018-08-01 DIAGNOSIS — C50919 Malignant neoplasm of unspecified site of unspecified female breast: Secondary | ICD-10-CM | POA: Diagnosis not present

## 2018-08-01 DIAGNOSIS — I10 Essential (primary) hypertension: Secondary | ICD-10-CM | POA: Diagnosis not present

## 2018-08-01 DIAGNOSIS — K219 Gastro-esophageal reflux disease without esophagitis: Secondary | ICD-10-CM | POA: Diagnosis not present

## 2018-08-01 DIAGNOSIS — R197 Diarrhea, unspecified: Secondary | ICD-10-CM | POA: Diagnosis not present

## 2018-08-01 DIAGNOSIS — Z6825 Body mass index (BMI) 25.0-25.9, adult: Secondary | ICD-10-CM | POA: Diagnosis not present

## 2018-08-14 DIAGNOSIS — H353124 Nonexudative age-related macular degeneration, left eye, advanced atrophic with subfoveal involvement: Secondary | ICD-10-CM | POA: Diagnosis not present

## 2018-08-14 DIAGNOSIS — H524 Presbyopia: Secondary | ICD-10-CM | POA: Diagnosis not present

## 2018-08-14 DIAGNOSIS — H04123 Dry eye syndrome of bilateral lacrimal glands: Secondary | ICD-10-CM | POA: Diagnosis not present

## 2018-08-14 DIAGNOSIS — H353113 Nonexudative age-related macular degeneration, right eye, advanced atrophic without subfoveal involvement: Secondary | ICD-10-CM | POA: Diagnosis not present

## 2018-08-29 ENCOUNTER — Ambulatory Visit (INDEPENDENT_AMBULATORY_CARE_PROVIDER_SITE_OTHER): Payer: Medicare Other | Admitting: Pulmonary Disease

## 2018-08-29 ENCOUNTER — Encounter: Payer: Self-pay | Admitting: Pulmonary Disease

## 2018-08-29 VITALS — BP 132/72 | HR 75 | Ht 60.0 in | Wt 132.0 lb

## 2018-08-29 DIAGNOSIS — A310 Pulmonary mycobacterial infection: Secondary | ICD-10-CM | POA: Diagnosis not present

## 2018-08-29 DIAGNOSIS — J479 Bronchiectasis, uncomplicated: Secondary | ICD-10-CM | POA: Diagnosis not present
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
## 2018-08-29 NOTE — Patient Instructions (Signed)
Bronchiectasis: History of MAI colonization but no evidence of active infection Continue flutter valve use at minimum twice a day, we will prescribe a device which can be cleaned in your home Use albuterol as needed for chest tightness wheezing or shortness of breath Continue taking Striverdi daily as you are doing  We will see you back in 6 months or sooner if needed

## 2018-09-09 DIAGNOSIS — S8012XA Contusion of left lower leg, initial encounter: Secondary | ICD-10-CM | POA: Diagnosis not present

## 2018-09-21 ENCOUNTER — Emergency Department (HOSPITAL_COMMUNITY): Payer: Medicare Other

## 2018-09-21 ENCOUNTER — Encounter (HOSPITAL_COMMUNITY): Payer: Self-pay | Admitting: Emergency Medicine

## 2018-09-21 ENCOUNTER — Emergency Department (HOSPITAL_BASED_OUTPATIENT_CLINIC_OR_DEPARTMENT_OTHER): Payer: Medicare Other

## 2018-09-21 ENCOUNTER — Emergency Department (HOSPITAL_COMMUNITY)
Admission: EM | Admit: 2018-09-21 | Discharge: 2018-09-21 | Disposition: A | Discharge status: Home / Self Care | Payer: Medicare Other | Attending: Emergency Medicine | Admitting: Emergency Medicine

## 2018-09-21 DIAGNOSIS — T148XXA Other injury of unspecified body region, initial encounter: Secondary | ICD-10-CM

## 2018-09-21 DIAGNOSIS — M7989 Other specified soft tissue disorders: Secondary | ICD-10-CM

## 2018-09-21 DIAGNOSIS — I10 Essential (primary) hypertension: Secondary | ICD-10-CM | POA: Insufficient documentation

## 2018-09-21 DIAGNOSIS — J45909 Unspecified asthma, uncomplicated: Secondary | ICD-10-CM | POA: Diagnosis not present

## 2018-09-21 DIAGNOSIS — M79662 Pain in left lower leg: Secondary | ICD-10-CM | POA: Diagnosis not present

## 2018-09-21 DIAGNOSIS — W19XXXA Unspecified fall, initial encounter: Secondary | ICD-10-CM | POA: Insufficient documentation

## 2018-09-21 DIAGNOSIS — Y999 Unspecified external cause status: Secondary | ICD-10-CM | POA: Insufficient documentation

## 2018-09-21 DIAGNOSIS — Z7982 Long term (current) use of aspirin: Secondary | ICD-10-CM | POA: Insufficient documentation

## 2018-09-21 DIAGNOSIS — R600 Localized edema: Secondary | ICD-10-CM | POA: Insufficient documentation

## 2018-09-21 DIAGNOSIS — Y939 Activity, unspecified: Secondary | ICD-10-CM | POA: Insufficient documentation

## 2018-09-21 DIAGNOSIS — S8992XA Unspecified injury of left lower leg, initial encounter: Secondary | ICD-10-CM | POA: Diagnosis not present

## 2018-09-21 DIAGNOSIS — S8012XA Contusion of left lower leg, initial encounter: Secondary | ICD-10-CM | POA: Insufficient documentation

## 2018-09-21 DIAGNOSIS — Z79899 Other long term (current) drug therapy: Secondary | ICD-10-CM | POA: Insufficient documentation

## 2018-09-21 DIAGNOSIS — Z85828 Personal history of other malignant neoplasm of skin: Secondary | ICD-10-CM | POA: Insufficient documentation

## 2018-09-21 DIAGNOSIS — Z853 Personal history of malignant neoplasm of breast: Secondary | ICD-10-CM | POA: Diagnosis not present

## 2018-09-21 DIAGNOSIS — Y929 Unspecified place or not applicable: Secondary | ICD-10-CM | POA: Insufficient documentation

## 2018-09-21 LAB — CBC WITH DIFFERENTIAL/PLATELET
Abs Immature Granulocytes: 0.06 10*3/uL (ref 0.00–0.07)
Basophils Absolute: 0.1 10*3/uL (ref 0.0–0.1)
Basophils Relative: 1 %
EOS ABS: 0.2 10*3/uL (ref 0.0–0.5)
EOS PCT: 2 %
HEMATOCRIT: 40.1 % (ref 36.0–46.0)
Hemoglobin: 12.4 g/dL (ref 12.0–15.0)
Immature Granulocytes: 1 %
LYMPHS PCT: 26 %
Lymphs Abs: 2.8 10*3/uL (ref 0.7–4.0)
MCH: 32.5 pg (ref 26.0–34.0)
MCHC: 30.9 g/dL (ref 30.0–36.0)
MCV: 105 fL — ABNORMAL HIGH (ref 80.0–100.0)
MONO ABS: 0.8 10*3/uL (ref 0.1–1.0)
Monocytes Relative: 8 %
NRBC: 0 % (ref 0.0–0.2)
Neutro Abs: 6.7 10*3/uL (ref 1.7–7.7)
Neutrophils Relative %: 62 %
Platelets: 430 10*3/uL — ABNORMAL HIGH (ref 150–400)
RBC: 3.82 MIL/uL — ABNORMAL LOW (ref 3.87–5.11)
RDW: 13.9 % (ref 11.5–15.5)
WBC: 10.6 10*3/uL — AB (ref 4.0–10.5)

## 2018-09-21 LAB — BASIC METABOLIC PANEL
Anion gap: 10 (ref 5–15)
BUN: 18 mg/dL (ref 8–23)
CALCIUM: 9.3 mg/dL (ref 8.9–10.3)
CHLORIDE: 102 mmol/L (ref 98–111)
CO2: 29 mmol/L (ref 22–32)
CREATININE: 0.86 mg/dL (ref 0.44–1.00)
GFR calc non Af Amer: 59 mL/min — ABNORMAL LOW (ref >60)
Glucose, Bld: 105 mg/dL — ABNORMAL HIGH (ref 70–99)
Potassium: 3.9 mmol/L (ref 3.5–5.1)
SODIUM: 141 mmol/L (ref 135–145)
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
## 2018-09-21 NOTE — ED Notes (Signed)
Ultrasound in room

## 2018-09-21 NOTE — Discharge Instructions (Signed)
You were seen in the emergency department for persistent left lower leg swelling.  You had some basic blood work and an ultrasound that did not show any signs of blood clot in the vessels.  You also had an ultrasound which showed that that swelling is due to a hematoma.  It does not look like a drainable collection now.  This will usually improve on its own but you may want to follow-up with an orthopedic surgeon for evaluation if they want to drain it.

## 2018-09-21 NOTE — ED Triage Notes (Signed)
Pt reports that she fell 2 weeks ago and got hematoma on left lower leg and saw PCP who said apply heat and will go away. Pt still having swelling and redness and pain to left lower leg.

## 2018-10-18 ENCOUNTER — Telehealth: Payer: Self-pay | Admitting: Pulmonary Disease

## 2018-10-18 DIAGNOSIS — M25562 Pain in left knee: Secondary | ICD-10-CM | POA: Diagnosis not present

## 2018-10-18 DIAGNOSIS — Z471 Aftercare following joint replacement surgery: Secondary | ICD-10-CM | POA: Diagnosis not present

## 2018-10-18 DIAGNOSIS — Z96653 Presence of artificial knee joint, bilateral: Secondary | ICD-10-CM | POA: Diagnosis not present

## 2018-10-18 DIAGNOSIS — S8012XA Contusion of left lower leg, initial encounter: Secondary | ICD-10-CM | POA: Diagnosis not present

## 2018-10-22 NOTE — Telephone Encounter (Signed)
Pt's daughter Mary Sella is calling back [phone]

## 2018-10-22 NOTE — Telephone Encounter (Signed)
Spoke with pt's daughter, Jan. She would like to have this mailed to the pt's address. Will route to [place] as she is the one with the forms.

## 2018-10-22 NOTE — Telephone Encounter (Signed)
This phone note has not been touched or routed to me to follow up on... Forms have been filled out and signed by BQ, and have been faxed to  [facility] patient assistance program with a confirmation fax received.  A copy of forms was made for my records until we receive confirmation of pt's approval.    lmtcb X1 for daughter Jan (dpr on file) to see if she wants to pick up originals or if she wants them mailed to her.  Originals are in my possession (my pink "to-do" binder) until I hear how to proceed. Will await call back.

## 2018-10-22 NOTE — Telephone Encounter (Signed)
Original forms mailed back to pt's address on file.  Daughter Jan aware.  Nothing further needed at this time.

## 2019-01-10 ENCOUNTER — Ambulatory Visit: Payer: Medicare Other | Admitting: Hematology and Oncology

## 2019-01-14 ENCOUNTER — Other Ambulatory Visit: Payer: Self-pay | Admitting: *Deleted

## 2019-01-14 DIAGNOSIS — C50411 Malignant neoplasm of upper-outer quadrant of right female breast: Secondary | ICD-10-CM

## 2019-01-14 DIAGNOSIS — Z170 Estrogen receptor positive status [ER+]: Principal | ICD-10-CM

## 2019-01-14 NOTE — Progress Notes (Signed)
Pt daughter Hortencia Pilar [phone]) called for pt stating pt needed her yearly mammogram done.  Pt prefers to have imaging done at [facility].  Orders faxed to [facility] at [facility] [phone]). Pt daughter Jan notified and I instructed her that [facility] will reach out to her to schedule apt and if she does not hear from them in a week to give them a call.  Verbalized understanding.

## 2019-03-04 DIAGNOSIS — R319 Hematuria, unspecified: Secondary | ICD-10-CM | POA: Diagnosis not present

## 2019-03-04 DIAGNOSIS — R82998 Other abnormal findings in urine: Secondary | ICD-10-CM | POA: Diagnosis not present

## 2019-04-04 DIAGNOSIS — N390 Urinary tract infection, site not specified: Secondary | ICD-10-CM | POA: Diagnosis not present

## 2019-04-04 DIAGNOSIS — R319 Hematuria, unspecified: Secondary | ICD-10-CM | POA: Diagnosis not present

## 2019-04-18 DIAGNOSIS — D3501 Benign neoplasm of right adrenal gland: Secondary | ICD-10-CM | POA: Diagnosis not present

## 2019-04-18 DIAGNOSIS — N329 Bladder disorder, unspecified: Secondary | ICD-10-CM | POA: Diagnosis not present

## 2019-04-18 DIAGNOSIS — N8110 Cystocele, unspecified: Secondary | ICD-10-CM | POA: Diagnosis not present

## 2019-04-18 DIAGNOSIS — N390 Urinary tract infection, site not specified: Secondary | ICD-10-CM | POA: Diagnosis not present

## 2019-04-18 DIAGNOSIS — R310 Gross hematuria: Secondary | ICD-10-CM | POA: Diagnosis not present

## 2019-04-18 DIAGNOSIS — D3500 Benign neoplasm of unspecified adrenal gland: Secondary | ICD-10-CM | POA: Diagnosis not present

## 2019-05-03 DIAGNOSIS — R928 Other abnormal and inconclusive findings on diagnostic imaging of breast: Secondary | ICD-10-CM | POA: Diagnosis not present

## 2019-05-09 ENCOUNTER — Telehealth: Payer: Self-pay | Admitting: Hematology and Oncology

## 2019-05-09 NOTE — Assessment & Plan Note (Signed)
Right lumpectomy 05/03/2016: IDC grade 2, 1.3 cm, DCIS with calcifications and necrosis, lymphovascular invasion is identified, margins negative, 0/3 lymph nodes negative, ER 95%, PR 60%, HER-2 negative, Ki-67 20%, T1 cN0 stage IA CHEK-2 mutation  Treatment plan: 1. Patient did not want to go through adjuvant radiation therapy 2. Startedantiestrogen therapy with tamoxifen (prior diagnosis of osteoporosis) on 08/17/2017discontinued December 2017 due to hot flashes  Patient does not want to go on any further antiestrogen therapy. Left breast tenderness: No palpable lumps or nodules. Skin lesions: I instructed her to see dermatology.  Surveillance: 1.  Breast exam 01/09/2018: Benign 2. mammogram: Patient gets her mammograms at [facility].    She underwent mammograms August 2020  Return to clinic in1 yearfor follow-up

## 2019-05-09 NOTE — Telephone Encounter (Signed)
I talk with patient regarding phone visit  °

## 2019-05-10 ENCOUNTER — Encounter: Payer: Self-pay | Admitting: Hematology and Oncology

## 2019-05-15 NOTE — Progress Notes (Signed)
  HEMATOLOGY-ONCOLOGY TELEPHONE VISIT PROGRESS NOTE  I connected with Carla [name] on 05/16/2019 at  3:00 PM EDT by telephone and verified that I am speaking with the correct person using two identifiers.  I discussed the limitations, risks, security and privacy concerns of performing an evaluation and management service by telephone and the availability of in person appointments.  I also discussed with the patient that there may be a patient responsible charge related to this service. The patient expressed understanding and agreed to proceed.   History of Present Illness: Carla [name] is a 85 y.o. female with above-mentioned history of right breast cancer treated with lumpectomy, and who declined adjuvant radiation and tamoxifen therapy. She is currently on surveillance. I last saw her a year ago. Mammogram on 05/03/19 showed no evidence of malignancy bilaterally. She presents over the phone today for follow-up.   Oncology History  Breast cancer of upper-outer quadrant of right female breast ([place])  04/05/2016 Initial Diagnosis   Right breast biopsy 12:00:IDC, LVI present, grade 2-3, ER 95%, PR 60%, HER-2 negative ratio 1.42, Ki-67 20%, screening detected right breast mass 7 mm axilla negative,T1bN0 stage IA clinical stage   05/03/2016 Surgery   Right lumpectomy: IDC grade 2, 1.3 cm, DCIS with calcifications and necrosis, lymphovascular invasion is identified, margins negative, 0/3 lymph nodes negative, ER 95%, PR 60%, HER-2 negative, Ki-67 20%, T1 cN0 stage IA   05/23/2016 - 09/10/2016 Anti-estrogen oral therapy   Tamoxifen 20 mg daily, stopped in December 2017 due to hot flashes patient refused to try any other medication because of osteoporosis   06/20/2016 Genetic Testing   CHEK2 positive. Genes tested include: ATM, BARD1, BRCA1, BRCA2, BRIP1, CDH1, CHEK2, EPCAM, FANCC, MLH1, MSH2, MSH6, NBN, PALB2, PMS2, PTEN, RAD51C, RAD51D, TP53, and XRCC2.       Observations/Objective:  No evidence of  disease recurrence.   Assessment Plan:  Breast cancer of upper-outer quadrant of right female breast ([place]) Right lumpectomy 05/03/2016: IDC grade 2, 1.3 cm, DCIS with calcifications and necrosis, lymphovascular invasion is identified, margins negative, 0/3 lymph nodes negative, ER 95%, PR 60%, HER-2 negative, Ki-67 20%, T1 cN0 stage IA CHEK-2 mutation  Treatment plan: 1. Patient did not want to go through adjuvant radiation therapy 2. Startedantiestrogen therapy with tamoxifen (prior diagnosis of osteoporosis) on 08/17/2017discontinued December 2017 due to hot flashes  Patient does not want to go on any further antiestrogen therapy. Left breast tenderness: No palpable lumps or nodules. Skin lesions: I instructed her to see dermatology.  Surveillance: 1.  Breast exam 01/09/2018: Benign 2. mammogram: Patient gets her mammograms at [facility].    She underwent mammograms August 2020: Benign  Return to clinic in1 yearfor follow-up after mammograms to be done at [facility].  I discussed the assessment and treatment plan with the patient. The patient was provided an opportunity to ask questions and all were answered. The patient agreed with the plan and demonstrated an understanding of the instructions. The patient was advised to call back or seek an in-person evaluation if the symptoms worsen or if the condition fails to improve as anticipated.   I provided 15 minutes of non-face-to-face time during this encounter.   Rulon Eisenmenger, MD 05/16/2019    I, Molly Dorshimer, am acting as scribe for Nicholas Lose, MD.  I have reviewed the above documentation for accuracy and completeness, and I agree with the above.

## 2019-05-16 ENCOUNTER — Inpatient Hospital Stay: Payer: Medicare Other | Attending: Hematology and Oncology | Admitting: Hematology and Oncology

## 2019-05-16 DIAGNOSIS — Z170 Estrogen receptor positive status [ER+]: Secondary | ICD-10-CM | POA: Diagnosis not present

## 2019-05-16 DIAGNOSIS — Z79810 Long term (current) use of selective estrogen receptor modulators (SERMs): Secondary | ICD-10-CM

## 2019-05-16 DIAGNOSIS — C50411 Malignant neoplasm of upper-outer quadrant of right female breast: Secondary | ICD-10-CM | POA: Diagnosis not present

## 2019-05-17 ENCOUNTER — Telehealth: Payer: Self-pay | Admitting: Hematology and Oncology

## 2019-05-17 NOTE — Telephone Encounter (Signed)
I talk with patient regarding schedule and daughter will call about mammo

## 2019-05-23 DIAGNOSIS — R319 Hematuria, unspecified: Secondary | ICD-10-CM | POA: Diagnosis not present

## 2019-05-23 DIAGNOSIS — Z87440 Personal history of urinary (tract) infections: Secondary | ICD-10-CM | POA: Diagnosis not present

## 2019-05-23 DIAGNOSIS — N3289 Other specified disorders of bladder: Secondary | ICD-10-CM | POA: Diagnosis not present

## 2019-05-23 DIAGNOSIS — Z7189 Other specified counseling: Secondary | ICD-10-CM | POA: Diagnosis not present

## 2019-05-23 DIAGNOSIS — N390 Urinary tract infection, site not specified: Secondary | ICD-10-CM | POA: Diagnosis not present

## 2019-06-17 ENCOUNTER — Ambulatory Visit: Payer: Medicare Other | Admitting: Primary Care

## 2019-06-17 ENCOUNTER — Ambulatory Visit: Payer: Medicare Other | Admitting: Internal Medicine

## 2019-06-18 ENCOUNTER — Ambulatory Visit (INDEPENDENT_AMBULATORY_CARE_PROVIDER_SITE_OTHER): Payer: Medicare Other | Admitting: Pulmonary Disease

## 2019-06-18 ENCOUNTER — Encounter: Payer: Self-pay | Admitting: Pulmonary Disease

## 2019-06-18 ENCOUNTER — Telehealth: Payer: Self-pay

## 2019-06-18 ENCOUNTER — Other Ambulatory Visit: Payer: Self-pay

## 2019-06-18 DIAGNOSIS — J479 Bronchiectasis, uncomplicated: Secondary | ICD-10-CM | POA: Diagnosis not present

## 2019-06-18 MED ORDER — FLUTICASONE PROPIONATE 50 MCG/ACT NA SUSP: 1.0000 | Freq: Every day | NASAL | 3 refills | Status: DC | PRN

## 2019-06-18 MED ORDER — LEVOFLOXACIN 500 MG PO TABS: 500.0000 mg | ORAL_TABLET | Freq: Every day | ORAL | 0 refills | Status: DC

## 2019-06-18 NOTE — Patient Instructions (Signed)
Bronchiectasis  Stable symptoms  Medications being refilled We will facilitate your Striverdi application form Prescription for Levaquin sent into pharmacy  Other prescriptions to go through [facility] in 6 months

## 2019-06-18 NOTE — Progress Notes (Signed)
  Virtual Visit via Telephone Note  I connected with Carla [name] on 06/18/19 at  2:00 PM EDT by telephone and verified that I am speaking with the correct person using two identifiers.  Location: Patient: Carla [name]  Provider: Adrian Prince discussed the limitations, risks, security and privacy concerns of performing an evaluation and management service by telephone and the availability of in person appointments. I also discussed with the patient that there may be a patient responsible charge related to this service. The patient expressed understanding and agreed to proceed.   History of Present Illness: Patient being evaluated for follow-up of bronchiectasis Has not had any significant issues recently No significant shortness of breath She remains active She coughs only in the morning, minimal secretions  She is had no fevers, no chills Appetite is maintained No weight loss  No obvious exacerbation of her bronchiectasis  Observations/Objective: She sounds well over the phone, does not sound short of breath  Assessment and Plan: Bronchiectasis -Stable  She continues to be active  Encouraged her to continue using inhalers  Encouraged to call with any significant concerns  Patient usually keeps a prescription of Levaquin on hand to start if she were having any problems, she will give [place] a call as soon as able if she were to start herself on Levaquin  Follow Up Instructions:  We will follow-up in 6 months Encouraged to call if any exacerbation  Medications will be refilled  Patient assistance forms will be facilitated  I discussed the assessment and treatment plan with the patient. The patient was provided an opportunity to ask questions and all were answered. The patient agreed with the plan and demonstrated an understanding of the instructions.   The patient was advised to call back or seek an in-person evaluation if the symptoms worsen or if the condition fails to  improve as anticipated.  I provided 15 minutes of non-face-to-face time during this encounter.   Laurin Coder, MD

## 2019-06-18 NOTE — Telephone Encounter (Signed)
Patient had a telephone visit with Dr. Ander Slade today. Her daughter voiced that she will be do for her BI cares application again soon for her Striverdi inhaler. I have mailed the patient portion to her and she will return it so that we can send all information together. The provider portion is being held so that we can send it off once patient mails her portion back.

## 2019-06-27 DIAGNOSIS — Z23 Encounter for immunization: Secondary | ICD-10-CM | POA: Diagnosis not present

## 2019-07-10 DIAGNOSIS — I10 Essential (primary) hypertension: Secondary | ICD-10-CM | POA: Diagnosis not present

## 2019-07-10 DIAGNOSIS — D539 Nutritional anemia, unspecified: Secondary | ICD-10-CM | POA: Diagnosis not present

## 2019-07-17 DIAGNOSIS — N390 Urinary tract infection, site not specified: Secondary | ICD-10-CM | POA: Diagnosis not present

## 2019-07-17 DIAGNOSIS — Z0000 Encounter for general adult medical examination without abnormal findings: Secondary | ICD-10-CM | POA: Diagnosis not present

## 2019-07-17 DIAGNOSIS — C50919 Malignant neoplasm of unspecified site of unspecified female breast: Secondary | ICD-10-CM | POA: Diagnosis not present

## 2019-07-17 DIAGNOSIS — I10 Essential (primary) hypertension: Secondary | ICD-10-CM | POA: Diagnosis not present

## 2019-07-17 DIAGNOSIS — J479 Bronchiectasis, uncomplicated: Secondary | ICD-10-CM | POA: Diagnosis not present

## 2019-07-17 DIAGNOSIS — Z1331 Encounter for screening for depression: Secondary | ICD-10-CM | POA: Diagnosis not present

## 2019-08-05 ENCOUNTER — Telehealth: Payer: Self-pay | Admitting: Pulmonary Disease

## 2019-08-05 NOTE — Telephone Encounter (Signed)
Spoke with the pt's daughter  She states pt is wanting to switch to Dr Carlis Abbott  Please advise if this is okay, thanks

## 2019-08-06 NOTE — Telephone Encounter (Signed)
Dr. Carlis Abbott please advise if you are ok with taking on this patient.  Thanks!

## 2019-08-06 NOTE — Telephone Encounter (Signed)
That is fine, I am happy to see her.  Arby Barrette

## 2019-08-06 NOTE — Telephone Encounter (Signed)
Yes  Okay to switch

## 2019-08-06 NOTE — Telephone Encounter (Signed)
Spoke with pt, aware of provider change. Pt not due for appt until 11/2019.  Recall placed in chart to schedule appt closer to time.  Nothing further needed at this time- will close encounter.

## 2019-08-20 DIAGNOSIS — H43813 Vitreous degeneration, bilateral: Secondary | ICD-10-CM | POA: Diagnosis not present

## 2019-08-20 DIAGNOSIS — H524 Presbyopia: Secondary | ICD-10-CM | POA: Diagnosis not present

## 2019-08-20 DIAGNOSIS — H04123 Dry eye syndrome of bilateral lacrimal glands: Secondary | ICD-10-CM | POA: Diagnosis not present

## 2019-08-20 DIAGNOSIS — H353132 Nonexudative age-related macular degeneration, bilateral, intermediate dry stage: Secondary | ICD-10-CM | POA: Diagnosis not present

## 2019-10-23 ENCOUNTER — Encounter: Payer: Self-pay | Admitting: Critical Care Medicine

## 2019-10-23 NOTE — Progress Notes (Signed)
Striverdi patient assistance forms completed.  Julian Hy, DO 10/23/19 6:33 PM [facility]

## 2019-10-30 ENCOUNTER — Other Ambulatory Visit: Payer: Self-pay

## 2019-10-30 MED ORDER — STRIVERDI RESPIMAT 2.5 MCG/ACT IN AERS: 2.0000 | INHALATION_SPRAY | Freq: Every morning | RESPIRATORY_TRACT | 3 refills | Status: DC

## 2020-01-01 ENCOUNTER — Ambulatory Visit (INDEPENDENT_AMBULATORY_CARE_PROVIDER_SITE_OTHER): Payer: Medicare Other | Admitting: Critical Care Medicine

## 2020-01-01 ENCOUNTER — Other Ambulatory Visit: Payer: Self-pay

## 2020-01-01 ENCOUNTER — Encounter: Payer: Self-pay | Admitting: Critical Care Medicine

## 2020-01-01 VITALS — BP 116/60 | HR 85 | Temp 97.9°F | Ht 61.0 in | Wt 132.6 lb

## 2020-01-01 DIAGNOSIS — A310 Pulmonary mycobacterial infection: Secondary | ICD-10-CM | POA: Diagnosis not present

## 2020-01-01 DIAGNOSIS — J479 Bronchiectasis, uncomplicated: Secondary | ICD-10-CM | POA: Diagnosis not present

## 2020-01-01 NOTE — Patient Instructions (Addendum)
Thank you for visiting Dr. Carlis Abbott at [facility]. We recommend the following:  Keep taking Straverdi once daily.  If your symptoms are getting worse- start using nebulizer two times daily with flutter valve.     Return in about 6 months (around 07/02/2020).    Please do your part to reduce the spread of COVID-19.
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
## 2020-04-06 DIAGNOSIS — S00212A Abrasion of left eyelid and periocular area, initial encounter: Secondary | ICD-10-CM | POA: Diagnosis not present

## 2020-04-06 DIAGNOSIS — M7989 Other specified soft tissue disorders: Secondary | ICD-10-CM | POA: Diagnosis not present

## 2020-04-06 DIAGNOSIS — S52322A Displaced transverse fracture of shaft of left radius, initial encounter for closed fracture: Secondary | ICD-10-CM | POA: Diagnosis not present

## 2020-04-06 DIAGNOSIS — Y998 Other external cause status: Secondary | ICD-10-CM | POA: Diagnosis not present

## 2020-04-06 DIAGNOSIS — S52615A Nondisplaced fracture of left ulna styloid process, initial encounter for closed fracture: Secondary | ICD-10-CM | POA: Diagnosis not present

## 2020-04-06 DIAGNOSIS — S6992XA Unspecified injury of left wrist, hand and finger(s), initial encounter: Secondary | ICD-10-CM | POA: Diagnosis not present

## 2020-04-06 DIAGNOSIS — Z23 Encounter for immunization: Secondary | ICD-10-CM | POA: Diagnosis not present

## 2020-04-06 DIAGNOSIS — S0012XA Contusion of left eyelid and periocular area, initial encounter: Secondary | ICD-10-CM | POA: Diagnosis not present

## 2020-04-06 DIAGNOSIS — W1839XA Other fall on same level, initial encounter: Secondary | ICD-10-CM | POA: Diagnosis not present

## 2020-04-06 DIAGNOSIS — S52602A Unspecified fracture of lower end of left ulna, initial encounter for closed fracture: Secondary | ICD-10-CM | POA: Diagnosis not present

## 2020-04-06 DIAGNOSIS — S52612A Displaced fracture of left ulna styloid process, initial encounter for closed fracture: Secondary | ICD-10-CM | POA: Diagnosis not present

## 2020-04-06 DIAGNOSIS — S52502A Unspecified fracture of the lower end of left radius, initial encounter for closed fracture: Secondary | ICD-10-CM | POA: Diagnosis not present

## 2020-04-14 DIAGNOSIS — S52615D Nondisplaced fracture of left ulna styloid process, subsequent encounter for closed fracture with routine healing: Secondary | ICD-10-CM | POA: Diagnosis not present

## 2020-04-14 DIAGNOSIS — S52592D Other fractures of lower end of left radius, subsequent encounter for closed fracture with routine healing: Secondary | ICD-10-CM | POA: Diagnosis not present

## 2020-04-14 DIAGNOSIS — S52502A Unspecified fracture of the lower end of left radius, initial encounter for closed fracture: Secondary | ICD-10-CM | POA: Diagnosis not present

## 2020-04-14 DIAGNOSIS — S52602A Unspecified fracture of lower end of left ulna, initial encounter for closed fracture: Secondary | ICD-10-CM | POA: Diagnosis not present

## 2020-04-24 DIAGNOSIS — S52602A Unspecified fracture of lower end of left ulna, initial encounter for closed fracture: Secondary | ICD-10-CM | POA: Diagnosis not present

## 2020-04-24 DIAGNOSIS — S52502A Unspecified fracture of the lower end of left radius, initial encounter for closed fracture: Secondary | ICD-10-CM | POA: Diagnosis not present

## 2020-05-04 DIAGNOSIS — Z853 Personal history of malignant neoplasm of breast: Secondary | ICD-10-CM | POA: Diagnosis not present

## 2020-05-04 DIAGNOSIS — R928 Other abnormal and inconclusive findings on diagnostic imaging of breast: Secondary | ICD-10-CM | POA: Diagnosis not present

## 2020-05-11 ENCOUNTER — Telehealth: Payer: Self-pay | Admitting: Critical Care Medicine

## 2020-05-11 DIAGNOSIS — A310 Pulmonary mycobacterial infection: Secondary | ICD-10-CM

## 2020-05-11 DIAGNOSIS — J479 Bronchiectasis, uncomplicated: Secondary | ICD-10-CM

## 2020-05-11 NOTE — Telephone Encounter (Signed)
Ok to refill albuterol nebs TID PRN with 11 refills.  Julian Hy, DO 05/11/20 6:00 PM [facility]

## 2020-05-11 NOTE — Telephone Encounter (Signed)
Called and spoke with Patient. Patient requested a refill for her neb medication to be sent to [facility] in [place]. Patient denied needing any neb machine supplies. Per LOV 01/01/20- Nebs are mentioned, but no neb medication listed.  No neb medication listed on current med list. Patient stated she uses Albuterol nebs.  Patient is not completely out at this time, and said we can call her tomorrow, and leave message on machine, to let her know neb refill has been sent to pharmacy.  LOV 01/01/20- Dr Carlis Abbott  Thank you for visiting Dr. Carlis Abbott at [facility]. We recommend the following:  Keep taking Straverdi once daily.  If your symptoms are getting worse- start using nebulizer two times daily with flutter valve.     Return in about 6 months (around 07/02/2020).   Dr Vira Browns advise on Albuterol neb prescription

## 2020-05-12 NOTE — Telephone Encounter (Signed)
Called and spoke to pt's daughter, Helene Kelp. Informed her of the recs per Dr. Carlis Abbott. She verbalized understanding and states the pt still needs nebulizer supplies. Helene Kelp is requesting both med and neb supplies go through [facility].   Will forward Mandy to print script and have Dr. Carlis Abbott sign script. I will pend the order for the DME and script.

## 2020-05-12 NOTE — Telephone Encounter (Signed)
Ellery Plunk daughter would like for our office to give her a call. Helene Kelp phone number is [phone].

## 2020-05-13 DIAGNOSIS — Z23 Encounter for immunization: Secondary | ICD-10-CM | POA: Diagnosis not present

## 2020-05-17 NOTE — Progress Notes (Signed)
  HEMATOLOGY-ONCOLOGY TELEPHONE VISIT PROGRESS NOTE  I connected with Carla [name] on 05/18/2020 at  3:00 PM EDT by telephone and verified that I am speaking with the correct person using two identifiers.  I discussed the limitations, risks, security and privacy concerns of performing an evaluation and management service by telephone and the availability of in person appointments.  I also discussed with the patient that there may be a patient responsible charge related to this service. The patient expressed understanding and agreed to proceed.   History of Present Illness: Carla [name] is a 86 y.o. female with above-mentioned history of right breast cancer treated with lumpectomy, and who declined adjuvant radiation and tamoxifen therapy. She is currently on surveillance. She presents over the phone today for follow-up.   Oncology History  Breast cancer of upper-outer quadrant of right female breast ([place])  04/05/2016 Initial Diagnosis   Right breast biopsy 12:00:IDC, LVI present, grade 2-3, ER 95%, PR 60%, HER-2 negative ratio 1.42, Ki-67 20%, screening detected right breast mass 7 mm axilla negative,T1bN0 stage IA clinical stage   05/03/2016 Surgery   Right lumpectomy: IDC grade 2, 1.3 cm, DCIS with calcifications and necrosis, lymphovascular invasion is identified, margins negative, 0/3 lymph nodes negative, ER 95%, PR 60%, HER-2 negative, Ki-67 20%, T1 cN0 stage IA   05/23/2016 - 09/10/2016 Anti-estrogen oral therapy   Tamoxifen 20 mg daily, stopped in December 2017 due to hot flashes patient refused to try any other medication because of osteoporosis   06/20/2016 Genetic Testing   CHEK2 positive. Genes tested include: ATM, BARD1, BRCA1, BRCA2, BRIP1, CDH1, CHEK2, EPCAM, FANCC, MLH1, MSH2, MSH6, NBN, PALB2, PMS2, PTEN, RAD51C, RAD51D, TP53, and XRCC2.        Observations/Objective:     Assessment Plan:  Breast cancer of upper-outer quadrant of right female breast ([place]) Right lumpectomy  05/03/2016: IDC grade 2, 1.3 cm, DCIS with calcifications and necrosis, lymphovascular invasion is identified, margins negative, 0/3 lymph nodes negative, ER 95%, PR 60%, HER-2 negative, Ki-67 20%, T1 cN0 stage IA CHEK-2 mutation  Treatment plan: 1. Patient did not want to go through adjuvant radiation therapy 2. Startedantiestrogen therapy with tamoxifen (prior diagnosis of osteoporosis) on 08/17/2017discontinued December 2017 due to hot flashes  Patient does not want to go on any further antiestrogen therapy. Left breast tenderness: No palpable lumps or nodules. Skin lesions: I instructed her to see dermatology.  Surveillance: 1.Breast exam 05/18/2020: Benign 2.mammogram 05/04/2020:Benign  Return to clinic in1 yearfor follow-up after mammograms to be done at [facility].    I discussed the assessment and treatment plan with the patient. The patient was provided an opportunity to ask questions and all were answered. The patient agreed with the plan and demonstrated an understanding of the instructions. The patient was advised to call back or seek an in-person evaluation if the symptoms worsen or if the condition fails to improve as anticipated.   I provided 12 minutes of non-face-to-face time during this encounter.   Rulon Eisenmenger, MD 05/18/2020    I, Molly Dorshimer, am acting as scribe for Nicholas Lose, MD.  I have reviewed the above documentation for accuracy and completeness, and I agree with the above.

## 2020-05-18 ENCOUNTER — Inpatient Hospital Stay: Payer: Medicare Other | Attending: Hematology and Oncology | Admitting: Hematology and Oncology

## 2020-05-18 DIAGNOSIS — Z1589 Genetic susceptibility to other disease: Secondary | ICD-10-CM | POA: Diagnosis not present

## 2020-05-18 DIAGNOSIS — Z170 Estrogen receptor positive status [ER+]: Secondary | ICD-10-CM | POA: Diagnosis not present

## 2020-05-18 DIAGNOSIS — C50411 Malignant neoplasm of upper-outer quadrant of right female breast: Secondary | ICD-10-CM | POA: Diagnosis not present

## 2020-05-18 DIAGNOSIS — Z1502 Genetic susceptibility to malignant neoplasm of ovary: Secondary | ICD-10-CM

## 2020-05-18 DIAGNOSIS — Z1509 Genetic susceptibility to other malignant neoplasm: Secondary | ICD-10-CM | POA: Diagnosis not present

## 2020-05-18 DIAGNOSIS — C50919 Malignant neoplasm of unspecified site of unspecified female breast: Secondary | ICD-10-CM | POA: Diagnosis not present

## 2020-05-18 MED ORDER — ALBUTEROL SULFATE (2.5 MG/3ML) 0.083% IN NEBU: 2.5000 mg | INHALATION_SOLUTION | Freq: Four times a day (QID) | RESPIRATORY_TRACT | 12 refills | Status: DC | PRN

## 2020-05-18 NOTE — Assessment & Plan Note (Signed)
Right lumpectomy 05/03/2016: IDC grade 2, 1.3 cm, DCIS with calcifications and necrosis, lymphovascular invasion is identified, margins negative, 0/3 lymph nodes negative, ER 95%, PR 60%, HER-2 negative, Ki-67 20%, T1 cN0 stage IA CHEK-2 mutation  Treatment plan: 1. Patient did not want to go through adjuvant radiation therapy 2. Startedantiestrogen therapy with tamoxifen (prior diagnosis of osteoporosis) on 08/17/2017discontinued December 2017 due to hot flashes  Patient does not want to go on any further antiestrogen therapy. Left breast tenderness: No palpable lumps or nodules. Skin lesions: I instructed her to see dermatology.  Surveillance: 1.Breast exam 05/18/2020: Benign 2.mammogram:Patient gets her mammograms at [facility].   She underwent mammograms August 2020: Benign  Return to clinic in1 yearfor follow-up after mammograms to be done at [facility].

## 2020-05-18 NOTE — Telephone Encounter (Signed)
DME order sent in for supplies and RX for nebulizer medication sent to [facility]. Nothing further needed at this time.

## 2020-05-18 NOTE — Addendum Note (Signed)
Addended by: Lia Foyer R on: 05/18/2020 10:18 AM   Modules accepted: Orders

## 2020-05-19 ENCOUNTER — Telehealth: Payer: Self-pay | Admitting: Hematology and Oncology

## 2020-05-19 NOTE — Telephone Encounter (Signed)
Scheduled appts per 8/23 los. Was not able to leave voicemail. Mailed reminder letter and calendar.

## 2020-05-25 ENCOUNTER — Telehealth: Payer: Self-pay | Admitting: Critical Care Medicine

## 2020-05-26 DIAGNOSIS — M25532 Pain in left wrist: Secondary | ICD-10-CM | POA: Diagnosis not present

## 2020-05-26 MED ORDER — ALBUTEROL SULFATE (2.5 MG/3ML) 0.083% IN NEBU: INHALATION_SOLUTION | RESPIRATORY_TRACT | 12 refills | Status: DC

## 2020-05-26 NOTE — Telephone Encounter (Signed)
New Rx for pt's albuterol neb sol has been sent to [facility] stating for pt to use 85ml every 6 hours and prn. Nothing further needed.

## 2020-06-03 ENCOUNTER — Telehealth: Payer: Self-pay | Admitting: Adult Health

## 2020-06-03 NOTE — Telephone Encounter (Signed)
Called pt- someone answered the phone and then hung up, WCB.

## 2020-06-04 NOTE — Telephone Encounter (Signed)
Pt returning call - at home for rest of evening - tomorrow morning gone until about 12:30 - [phone]

## 2020-06-04 NOTE — Telephone Encounter (Signed)
Called and and line rings with no answer and no VM

## 2020-06-05 ENCOUNTER — Telehealth: Payer: Self-pay | Admitting: Adult Health

## 2020-06-05 NOTE — Telephone Encounter (Signed)
That is worrisome. I don't see when she was prescribed antibiotics. Regardless, if she isn't doing well on outpatient treatment she needs to be seen in the ED. I am worried that antibiotics and nebulizers have not helped and she is getting worse. Please recommend that she go to the ED for evaluation.  Julian Hy, DO 06/05/20 4:13 PM [facility]

## 2020-06-05 NOTE — Telephone Encounter (Signed)
Called and spoke with patient, advised of recommendations by Dr. Carlis Abbott.  Patient scheduled to see Eric Form NP on 9/15 at 3 pm.  She verbalized understanding.  Nothing further needed.

## 2020-06-05 NOTE — Telephone Encounter (Signed)
I will close this encounter . Patient has a sick call waiting for response from provider.

## 2020-06-05 NOTE — Telephone Encounter (Signed)
Spoke with patient regarding prior message. Patient stated she has finished her medication LEVAQUIN. Patient stated  She has coughed up cream color mucus and having some wheezing and sob. Patient stated she has used her nebs and her inhaler every 6 hrs prn. Patient stated is not helping. Dr.Clark can you please advise  Thank you.

## 2020-06-05 NOTE — Telephone Encounter (Signed)
Spoke with patient. I asked when she was prescribed Levaquin. She stated "it was prescribed by the doctor before Dr. Carlis Abbott" and that he had given her several refills. She has been storing the refills in a closet. She started the last round of Levaquin 500mg  on last Friday and finished Wednesday. She has another round left in her closet.   I advised her that Dr. Carlis Abbott recommended that she be seen in the ED. She refused and stated that she is not running any fevers.   I called both [facility] and [facility] in [place]. Per [facility], she last had the Levaquin filled on 07/02/18 for 7 tabs. [facility] did not have any information on file for her.

## 2020-06-05 NOTE — Telephone Encounter (Signed)
She can continue taking levofloxacin that she has, but I continue to recommend ER evaluation if her SOB is worsening on antibiotics and bronchodilators. She can have an acute visit next week if she likes, but my worry is her deteriorating respiratory status.  Julian Hy, DO 06/05/20 4:56 PM [facility]

## 2020-06-10 ENCOUNTER — Other Ambulatory Visit: Payer: Self-pay

## 2020-06-10 ENCOUNTER — Ambulatory Visit (INDEPENDENT_AMBULATORY_CARE_PROVIDER_SITE_OTHER): Payer: Medicare Other

## 2020-06-10 ENCOUNTER — Encounter: Payer: Self-pay | Admitting: Acute Care

## 2020-06-10 ENCOUNTER — Ambulatory Visit (INDEPENDENT_AMBULATORY_CARE_PROVIDER_SITE_OTHER): Payer: Medicare Other | Admitting: Acute Care

## 2020-06-10 VITALS — BP 112/74 | HR 78 | Temp 97.9°F | Ht 62.0 in | Wt 132.0 lb

## 2020-06-10 DIAGNOSIS — R0602 Shortness of breath: Secondary | ICD-10-CM | POA: Diagnosis not present

## 2020-06-10 DIAGNOSIS — R05 Cough: Secondary | ICD-10-CM | POA: Diagnosis not present

## 2020-06-10 DIAGNOSIS — J479 Bronchiectasis, uncomplicated: Secondary | ICD-10-CM

## 2020-06-10 MED ORDER — PREDNISONE 10 MG (21) PO TBPK: ORAL_TABLET | Freq: Every day | ORAL | 0 refills | Status: DC

## 2020-06-10 MED ORDER — SODIUM CHLORIDE 3 % IN NEBU: INHALATION_SOLUTION | RESPIRATORY_TRACT | 0 refills | Status: DC | PRN

## 2020-06-10 NOTE — Progress Notes (Signed)
I attempted to call these results to the patient's daughter. There was no answer on her cell phone. I left a message on her voice mail explaining that her mother's CXR was negative for pneumonia. I asked her to follow the plan of care we created in the office today. I asked her to call the office is she had any questions.
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
## 2020-06-10 NOTE — Patient Instructions (Addendum)
It is good to see you today. We will do a CXR today We will call you with results Re-start Mucinex 1200 mg once daily Take with a full glass of water. Use Flutter Valve 3-4 blows at least 3 times daily We will order 3% saline nebs to use when you are having a flare. ( To thin secretions) Use these in the morning and in the evening when you are sick, for 3 days only Please use albuterol twice a day when you are having a flare>>  scheduled, one in the morning and once in the evening. Can use an additional 2 times during the day as needed for wheezing When not having a flare then use as needed.  Prednisone taper; 10 mg tablets: 4 tabs x 2 days, 3 tabs x 2 days, 2 tabs x 2 days 1 tab x 2 days then stop. Please provide paperwork for financial assistance for Striverdi Respimat. Continue using Striverdi Respimat Follow up in 1 week with Judson Roch NP or Dr. Carlis Abbott Please contact office for sooner follow up if symptoms do not improve or worsen or seek emergency care   Will consider CT Chest if this regimen does not help resolve flare to better evaluate chest Will need to get sputum for Culture, AFB and Fungus once off antibiotics for a week ( Completed dosing 9/15)

## 2020-06-11 ENCOUNTER — Encounter: Payer: Self-pay | Admitting: Acute Care

## 2020-06-17 ENCOUNTER — Encounter: Payer: Self-pay | Admitting: Hematology and Oncology

## 2020-06-22 ENCOUNTER — Ambulatory Visit (INDEPENDENT_AMBULATORY_CARE_PROVIDER_SITE_OTHER): Payer: Medicare Other | Admitting: Acute Care

## 2020-06-22 ENCOUNTER — Encounter: Payer: Self-pay | Admitting: Acute Care

## 2020-06-22 ENCOUNTER — Other Ambulatory Visit: Payer: Self-pay

## 2020-06-22 VITALS — BP 120/64 | HR 90 | Temp 98.2°F | Ht 62.0 in | Wt 133.4 lb

## 2020-06-22 DIAGNOSIS — J479 Bronchiectasis, uncomplicated: Secondary | ICD-10-CM | POA: Diagnosis not present

## 2020-06-22 DIAGNOSIS — I509 Heart failure, unspecified: Secondary | ICD-10-CM | POA: Diagnosis not present

## 2020-06-22 DIAGNOSIS — A310 Pulmonary mycobacterial infection: Secondary | ICD-10-CM | POA: Diagnosis not present

## 2020-06-22 NOTE — Patient Instructions (Addendum)
It is good to see you today. I am so glad you are feeling better. We will order a 2 D echo>> Call [phone] to schedule ( Daughter Helene Kelp) We will call you with results ContinueStriverdidaily as you have been doing. Rinse mouth after use Continue  Mucinex 600 mg twice daily  Take with a full glass of water. Use Flutter Valve 3-4 blows at least 3 times daily Please use albuterol twice a day >>  scheduled, one in the morning and once in the evening. Can use an additional 2 times during the day as needed for wheezing When not having a flare then use as needed.  Please provide paperwork for financial assistance for Striverdi Respimat. We will send you with sputum collection cups. Follow directions for  sputum collection Follow up in 2 months with Carla [name] or Carla [name] Please contact office for sooner follow up if symptoms do not improve or worsen or seek emergency care
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
## 2020-06-23 ENCOUNTER — Other Ambulatory Visit: Payer: Medicare Other

## 2020-06-23 ENCOUNTER — Encounter: Payer: Self-pay | Admitting: Acute Care

## 2020-06-23 DIAGNOSIS — J479 Bronchiectasis, uncomplicated: Secondary | ICD-10-CM

## 2020-06-23 DIAGNOSIS — A310 Pulmonary mycobacterial infection: Secondary | ICD-10-CM

## 2020-06-25 ENCOUNTER — Ambulatory Visit (HOSPITAL_COMMUNITY): Payer: Medicare Other | Attending: Cardiology

## 2020-06-25 ENCOUNTER — Other Ambulatory Visit: Payer: Self-pay

## 2020-06-25 DIAGNOSIS — I509 Heart failure, unspecified: Secondary | ICD-10-CM | POA: Diagnosis not present

## 2020-06-25 LAB — ECHOCARDIOGRAM COMPLETE
AR max vel: 1.42 cm2
AV Area VTI: 1.5 cm2
AV Area mean vel: 1.38 cm2
AV Mean grad: 9 mmHg
AV Peak grad: 13.3 mmHg
Ao pk vel: 1.82 m/s
Area-P 1/2: 3.6 cm2
S' Lateral: 2.1 cm

## 2020-06-30 ENCOUNTER — Telehealth: Payer: Self-pay | Admitting: Acute Care

## 2020-06-30 NOTE — Telephone Encounter (Signed)
ATC Juliann Pulse unable to reach and can't leave a message

## 2020-06-30 NOTE — Telephone Encounter (Signed)
Spoke with Juliann Pulse at [facility], states that pt's sputum culture has come back 1+ positive for AFB.  Full culture/sensitivities is still in process.  Preliminary results are being faxed to our office since they are not yet available in [place].    Pt last seen by SG on 9/27.  Will route to SG as FYI.  SG please advise if anything further is needed at this time. Thanks!

## 2020-06-30 NOTE — Telephone Encounter (Signed)
Will wait for final cultures Will call her tomorrow Thanks

## 2020-07-01 NOTE — Progress Notes (Signed)
Sputum fungal culture shows normal flora . Thanks

## 2020-07-02 DIAGNOSIS — L853 Xerosis cutis: Secondary | ICD-10-CM | POA: Diagnosis not present

## 2020-07-02 NOTE — Progress Notes (Signed)
Please call patient and let her know her echo looks good. She can discuss further at her follow up with Dr. Carlis Abbott in November. Thanks

## 2020-07-03 ENCOUNTER — Encounter: Payer: Self-pay | Admitting: *Deleted

## 2020-07-03 LAB — AFB ID BY DNA PROBE
M avium complex: POSITIVE — AB
M tuberculosis complex: NEGATIVE

## 2020-07-03 LAB — AFB CULTURE WITH SMEAR (NOT AT ARMC)
Acid Fast Culture: POSITIVE — AB
Acid Fast Smear: POSITIVE — AB

## 2020-07-08 ENCOUNTER — Telehealth: Payer: Self-pay | Admitting: Acute Care

## 2020-07-08 NOTE — Telephone Encounter (Signed)
I  Have spoken to the patient's daughter Carla [name] regarding the finding of MAC on most recent sputum cultures. This is not a new diagnosis for this patient. I will request susceptibility testing , but will not recollect another sputum, as we know thus to be an active diagnosis. I spoke with patient's daughter. She would not consider putting her mother through treatment for MAC.She is in agreement with susceptibility testing.   Amy, no need to get sputum cups for repeat culture.  Just call [facility] for the susceptibility testing. Thanks so much

## 2020-07-09 NOTE — Telephone Encounter (Signed)
I have called and spoke with Patrice at [facility] and she has added the test to be done. Nothing further needed at this time.

## 2020-07-10 ENCOUNTER — Other Ambulatory Visit (HOSPITAL_COMMUNITY): Payer: Medicare Other

## 2020-07-16 DIAGNOSIS — R82998 Other abnormal findings in urine: Secondary | ICD-10-CM | POA: Diagnosis not present

## 2020-07-16 DIAGNOSIS — R0600 Dyspnea, unspecified: Secondary | ICD-10-CM | POA: Diagnosis not present

## 2020-07-16 DIAGNOSIS — R062 Wheezing: Secondary | ICD-10-CM | POA: Diagnosis not present

## 2020-07-16 DIAGNOSIS — K219 Gastro-esophageal reflux disease without esophagitis: Secondary | ICD-10-CM | POA: Diagnosis not present

## 2020-07-16 DIAGNOSIS — Z853 Personal history of malignant neoplasm of breast: Secondary | ICD-10-CM | POA: Diagnosis not present

## 2020-07-16 DIAGNOSIS — Z0000 Encounter for general adult medical examination without abnormal findings: Secondary | ICD-10-CM | POA: Diagnosis not present

## 2020-07-16 DIAGNOSIS — A310 Pulmonary mycobacterial infection: Secondary | ICD-10-CM | POA: Diagnosis not present

## 2020-07-16 DIAGNOSIS — J069 Acute upper respiratory infection, unspecified: Secondary | ICD-10-CM | POA: Diagnosis not present

## 2020-07-16 DIAGNOSIS — Z23 Encounter for immunization: Secondary | ICD-10-CM | POA: Diagnosis not present

## 2020-07-16 DIAGNOSIS — F419 Anxiety disorder, unspecified: Secondary | ICD-10-CM | POA: Diagnosis not present

## 2020-07-16 DIAGNOSIS — I10 Essential (primary) hypertension: Secondary | ICD-10-CM | POA: Diagnosis not present

## 2020-07-20 LAB — RESPIRATORY CULTURE OR RESPIRATORY AND SPUTUM CULTURE
MICRO NUMBER:: 11004680
RESULT:: NORMAL
SPECIMEN QUALITY:: ADEQUATE

## 2020-07-20 LAB — FUNGUS CULTURE W SMEAR
MICRO NUMBER:: 11004679
SMEAR:: NONE SEEN
SPECIMEN QUALITY:: ADEQUATE

## 2020-07-28 DIAGNOSIS — S52602A Unspecified fracture of lower end of left ulna, initial encounter for closed fracture: Secondary | ICD-10-CM | POA: Diagnosis not present

## 2020-07-28 DIAGNOSIS — M810 Age-related osteoporosis without current pathological fracture: Secondary | ICD-10-CM | POA: Diagnosis not present

## 2020-07-28 DIAGNOSIS — S52502A Unspecified fracture of the lower end of left radius, initial encounter for closed fracture: Secondary | ICD-10-CM | POA: Diagnosis not present

## 2020-07-28 DIAGNOSIS — S52502D Unspecified fracture of the lower end of left radius, subsequent encounter for closed fracture with routine healing: Secondary | ICD-10-CM | POA: Diagnosis not present

## 2020-07-30 DIAGNOSIS — L853 Xerosis cutis: Secondary | ICD-10-CM | POA: Diagnosis not present

## 2020-07-30 DIAGNOSIS — L821 Other seborrheic keratosis: Secondary | ICD-10-CM | POA: Diagnosis not present

## 2020-07-30 DIAGNOSIS — D485 Neoplasm of uncertain behavior of skin: Secondary | ICD-10-CM | POA: Diagnosis not present

## 2020-07-31 LAB — MAC SUSCEPTIBILITY BROTH
Amikacin: 8
Ciprofloxacin: >8
Clarithromycin: 1
Doxycycline: >8
Linezolid: 32
Minocycline: 8
Moxifloxacin: 4
Rifabutin: 0.5
Rifampin: >4
Streptomycin: 32

## 2020-07-31 LAB — SPECIMEN STATUS REPORT

## 2020-08-10 ENCOUNTER — Encounter: Payer: Self-pay | Admitting: Critical Care Medicine

## 2020-08-10 ENCOUNTER — Other Ambulatory Visit: Payer: Self-pay

## 2020-08-10 ENCOUNTER — Ambulatory Visit (INDEPENDENT_AMBULATORY_CARE_PROVIDER_SITE_OTHER): Payer: Medicare Other | Admitting: Critical Care Medicine

## 2020-08-10 VITALS — BP 124/70 | HR 88 | Temp 98.7°F | Ht 61.0 in | Wt 136.4 lb

## 2020-08-10 DIAGNOSIS — A310 Pulmonary mycobacterial infection: Secondary | ICD-10-CM

## 2020-08-10 DIAGNOSIS — J479 Bronchiectasis, uncomplicated: Secondary | ICD-10-CM

## 2020-08-10 MED ORDER — PREDNISONE 10 MG PO TABS: 20.0000 mg | ORAL_TABLET | Freq: Every day | ORAL | 0 refills | Status: DC

## 2020-08-10 MED ORDER — AMOXICILLIN-POT CLAVULANATE 875-125 MG PO TABS: 1.0000 | ORAL_TABLET | Freq: Two times a day (BID) | ORAL | 0 refills | Status: DC

## 2020-08-10 MED ORDER — STRIVERDI RESPIMAT 2.5 MCG/ACT IN AERS: 2.0000 | INHALATION_SPRAY | Freq: Every morning | RESPIRATORY_TRACT | 3 refills | Status: DC

## 2020-08-10 NOTE — Patient Instructions (Addendum)
Thank you for visiting Dr. Carlis Abbott at [facility]. We recommend the following:  Keep all medications the same.  Keep using your flutter valve.  Meds ordered this encounter  Medications  . predniSONE (DELTASONE) 10 MG tablet    Sig: Take 2 tablets (20 mg total) by mouth daily with breakfast.    Dispense:  10 tablet    Refill:  0  . amoxicillin-clavulanate (AUGMENTIN) 875-125 MG tablet    Sig: Take 1 tablet by mouth 2 (two) times daily.    Dispense:  28 tablet    Refill:  0    Return in about 1 month (around 09/09/2020). with Dr. Silas Flood (30 minutes visit).    Please do your part to reduce the spread of COVID-19.
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
## 2020-08-11 NOTE — Progress Notes (Signed)
Please call patient's daughter and let her know the additional testing confirmed MAC, which is not a new diagnosis for this patient. Thanks so much. She is scheduled to see Dr. Silas Flood 12/20 at 1:30 pm.

## 2020-08-13 ENCOUNTER — Telehealth: Payer: Self-pay | Admitting: Critical Care Medicine

## 2020-08-13 NOTE — Telephone Encounter (Signed)
Called and spoke with pt letting her know that it was okay for her to be baptized. Pt verbalized understanding. Nothing further needed.

## 2020-08-23 DIAGNOSIS — B029 Zoster without complications: Secondary | ICD-10-CM | POA: Diagnosis not present

## 2020-09-14 ENCOUNTER — Encounter: Payer: Self-pay | Admitting: Pulmonary Disease

## 2020-09-14 ENCOUNTER — Ambulatory Visit (INDEPENDENT_AMBULATORY_CARE_PROVIDER_SITE_OTHER): Payer: Medicare Other | Admitting: Pulmonary Disease

## 2020-09-14 ENCOUNTER — Other Ambulatory Visit: Payer: Self-pay

## 2020-09-14 VITALS — BP 118/72 | HR 84 | Temp 97.6°F | Ht 61.0 in | Wt 133.6 lb

## 2020-09-14 DIAGNOSIS — J479 Bronchiectasis, uncomplicated: Secondary | ICD-10-CM | POA: Diagnosis not present

## 2020-09-14 DIAGNOSIS — A310 Pulmonary mycobacterial infection: Secondary | ICD-10-CM | POA: Diagnosis not present

## 2020-09-14 MED ORDER — SODIUM CHLORIDE 3 % IN NEBU: INHALATION_SOLUTION | Freq: Two times a day (BID) | RESPIRATORY_TRACT | 11 refills | Status: DC

## 2020-09-14 MED ORDER — DM-GUAIFENESIN ER 30-600 MG PO TB12: 1.0000 | ORAL_TABLET | Freq: Two times a day (BID) | ORAL | 11 refills | Status: DC

## 2020-09-14 MED ORDER — AMOXICILLIN-POT CLAVULANATE 875-125 MG PO TABS: 1.0000 | ORAL_TABLET | Freq: Two times a day (BID) | ORAL | 0 refills | Status: AC

## 2020-09-14 NOTE — Patient Instructions (Signed)
Nice to meet you!  Please avoid taking azithromycin - this medication is important for treating MAC if we need to  I provided a supply of Augmentin for if you experience an exacerbation. Please call us if you start taking. Increase the hypertonic saline nebulization to 4 times a day when taking the antibiotic or having an exacerbation.  Use hypertonic saline 2 times a day, everyday! Refilled today.  I sent Mucinex to the pharmacy to see if cheaper than over the counter.  Come back for follow up with Dr. Silas Flood in 3 months.
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
## 2020-09-23 ENCOUNTER — Telehealth: Payer: Self-pay | Admitting: Pulmonary Disease

## 2020-09-23 NOTE — Telephone Encounter (Signed)
disregard

## 2020-09-25 ENCOUNTER — Other Ambulatory Visit: Payer: Self-pay | Admitting: Pulmonary Disease

## 2020-09-30 ENCOUNTER — Telehealth: Payer: Self-pay | Admitting: Pulmonary Disease

## 2020-09-30 ENCOUNTER — Other Ambulatory Visit: Payer: Self-pay | Admitting: Pulmonary Disease

## 2020-09-30 MED ORDER — SODIUM CHLORIDE 3 % IN NEBU: INHALATION_SOLUTION | Freq: Two times a day (BID) | RESPIRATORY_TRACT | 11 refills | Status: DC

## 2020-09-30 NOTE — Telephone Encounter (Signed)
Called and spoke with Carla [name], listed on DPR regarding the sodium chloride hypertonic 3% nebulizer solution.  She stated that [facility] mail order pharmacy does not cover it and would like [facility] [place].  Advised I would send it electronically.  Script sent.  Nothing further needed.

## 2020-11-30 ENCOUNTER — Telehealth: Payer: Self-pay | Admitting: Pulmonary Disease

## 2020-11-30 MED ORDER — ALBUTEROL SULFATE (2.5 MG/3ML) 0.083% IN NEBU: INHALATION_SOLUTION | RESPIRATORY_TRACT | 12 refills | Status: DC

## 2020-11-30 NOTE — Telephone Encounter (Signed)
Spoke with Thayer Headings [facility] ) and advised that Albuterol neb refill was sent to [place]. She verbalized understanding and stated that she will let pt know this has been taken care of. Nothing further needed.

## 2020-12-15 ENCOUNTER — Ambulatory Visit: Payer: Medicare Other | Admitting: Pulmonary Disease

## 2020-12-16 ENCOUNTER — Telehealth: Payer: Self-pay | Admitting: Pulmonary Disease

## 2020-12-16 NOTE — Telephone Encounter (Signed)
LMTCB

## 2020-12-17 NOTE — Telephone Encounter (Signed)
Pt daughter returning call from office . Please leave detailed message     Please advise . Call back [phone]

## 2020-12-17 NOTE — Telephone Encounter (Signed)
No answer, will keep open

## 2020-12-17 NOTE — Telephone Encounter (Signed)
Will route to Dr. Silas Flood as Juluis Rainier until we hear from patient

## 2020-12-18 NOTE — Telephone Encounter (Signed)
Thayer Headings is returning phone call. Thayer Headings phone number is [phone].

## 2020-12-23 NOTE — Telephone Encounter (Signed)
Spoke with the pt's daughter ok per DPR  She states pt started augmentin on 12/15/20 bc she was coughing up more discolored sputum  She is on her last day of abx and is doing better  No increased SOB, fever  She has appt with [facility] 12/28/20 and I advised make sure she keeps this appt, and call sooner if needed  Will go ahead and close encounter

## 2020-12-28 ENCOUNTER — Other Ambulatory Visit: Payer: Self-pay

## 2020-12-28 ENCOUNTER — Encounter: Payer: Self-pay | Admitting: Pulmonary Disease

## 2020-12-28 ENCOUNTER — Ambulatory Visit (INDEPENDENT_AMBULATORY_CARE_PROVIDER_SITE_OTHER): Payer: Medicare Other | Admitting: Pulmonary Disease

## 2020-12-28 VITALS — BP 120/70 | HR 88 | Temp 98.0°F | Ht 61.0 in | Wt 134.0 lb

## 2020-12-28 DIAGNOSIS — J479 Bronchiectasis, uncomplicated: Secondary | ICD-10-CM

## 2020-12-28 MED ORDER — TRELEGY ELLIPTA 200-62.5-25 MCG/INH IN AEPB: 1.0000 | INHALATION_SPRAY | Freq: Every day | RESPIRATORY_TRACT | 0 refills | Status: DC

## 2020-12-28 NOTE — Progress Notes (Signed)
Patient seen in the office today and instructed on use of Trelegy 274mcg.  Patient expressed understanding and demonstrated technique.  Benetta Spar [facility] 12/28/20

## 2020-12-28 NOTE — Patient Instructions (Addendum)
Nice to see you again  Stop using Striverdi for now.  Use Trelegy 1 puff every day.  You can start this afternoon and take every morning from then on.  Rinse your mouth out after every use.  Continue to use the albuterol nebulizer as needed for wheezing, shortness of breath.  I am hopeful this new inhaler will calm down the wheezing, cough.  I worry this is more related to asthma than the bronchiectasis.  These medicines are very strong to treat this.  After 2 to 4 weeks of treatment if you are feeling better, I would recommend resuming the hypertonic saline nebulized twice a day.  We will send in new order for your nebulizer mouthpiece and tubing.  We can send in a new order for portable nebulizer as well and see what insurance will cover and how much it would cost.  Please call me if I can help in any way or if things change.  Return to clinic in 3 months for follow-up with Dr. Silas Flood
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
## 2021-01-21 ENCOUNTER — Telehealth: Payer: Self-pay | Admitting: Pulmonary Disease

## 2021-01-21 MED ORDER — TRELEGY ELLIPTA 200-62.5-25 MCG/INH IN AEPB: 1.0000 | INHALATION_SPRAY | Freq: Every day | RESPIRATORY_TRACT | 0 refills | Status: DC

## 2021-01-21 NOTE — Telephone Encounter (Signed)
Jan (ok per [facility]) calling to get a prescription of Trelegy called in to the [employer]  in [place]. They [employer] said that the pt would need to spend $600 oop to qualify for patient assistance. Jan can be reached at [phone]. Would like a call back tomorrow afternoon

## 2021-01-21 NOTE — Telephone Encounter (Signed)
I called and spoke with the pt's daughter, Carla [name] ok per DPR  She states pt needing pt assistance application and samples for the trelegy 200  I have left the [place] forms and 4 samples of trelegy 200 at the front to be picked up  She is aware to complete and have pt sign application and then drop off for [place] to fax along with rx  I will forward to Cherina to keep an eye out for her to return the forms, thanks!

## 2021-01-22 MED ORDER — TRELEGY ELLIPTA 200-62.5-25 MCG/INH IN AEPB: 1.0000 | INHALATION_SPRAY | Freq: Every day | RESPIRATORY_TRACT | 5 refills | Status: DC

## 2021-01-22 NOTE — Telephone Encounter (Signed)
Called and spoke with patient's daughter Thayer Headings. Advised her that I would go ahead and send the Trelegy to [facility] in [place]. Their goal is to pay for the Trelegy for one month to get to the $600 requirement and then apply again for [place] assistance. She was able to come by the office yesterday to get the samples. She stated that the Trelegy is covered but the insurance will only pay but so much for the medication.   I advised her to call us back if anything else was needed.

## 2021-02-08 DIAGNOSIS — H5310 Unspecified subjective visual disturbances: Secondary | ICD-10-CM | POA: Diagnosis not present

## 2021-02-08 DIAGNOSIS — H353133 Nonexudative age-related macular degeneration, bilateral, advanced atrophic without subfoveal involvement: Secondary | ICD-10-CM | POA: Diagnosis not present

## 2021-02-08 DIAGNOSIS — H0100A Unspecified blepharitis right eye, upper and lower eyelids: Secondary | ICD-10-CM | POA: Diagnosis not present

## 2021-02-08 DIAGNOSIS — H5213 Myopia, bilateral: Secondary | ICD-10-CM | POA: Diagnosis not present

## 2021-02-15 DIAGNOSIS — R5383 Other fatigue: Secondary | ICD-10-CM | POA: Diagnosis not present

## 2021-02-15 DIAGNOSIS — I6782 Cerebral ischemia: Secondary | ICD-10-CM | POA: Diagnosis not present

## 2021-02-15 DIAGNOSIS — R531 Weakness: Secondary | ICD-10-CM | POA: Diagnosis not present

## 2021-02-15 DIAGNOSIS — B961 Klebsiella pneumoniae [K. pneumoniae] as the cause of diseases classified elsewhere: Secondary | ICD-10-CM | POA: Diagnosis not present

## 2021-02-15 DIAGNOSIS — D72829 Elevated white blood cell count, unspecified: Secondary | ICD-10-CM | POA: Diagnosis not present

## 2021-02-15 DIAGNOSIS — N3000 Acute cystitis without hematuria: Secondary | ICD-10-CM | POA: Diagnosis not present

## 2021-02-15 DIAGNOSIS — J3489 Other specified disorders of nose and nasal sinuses: Secondary | ICD-10-CM | POA: Diagnosis not present

## 2021-02-15 DIAGNOSIS — J45909 Unspecified asthma, uncomplicated: Secondary | ICD-10-CM | POA: Diagnosis not present

## 2021-02-15 DIAGNOSIS — N3090 Cystitis, unspecified without hematuria: Secondary | ICD-10-CM | POA: Diagnosis not present

## 2021-02-15 DIAGNOSIS — R0902 Hypoxemia: Secondary | ICD-10-CM | POA: Diagnosis not present

## 2021-02-15 DIAGNOSIS — Z87440 Personal history of urinary (tract) infections: Secondary | ICD-10-CM | POA: Diagnosis not present

## 2021-02-15 DIAGNOSIS — I959 Hypotension, unspecified: Secondary | ICD-10-CM | POA: Diagnosis not present

## 2021-02-15 DIAGNOSIS — I10 Essential (primary) hypertension: Secondary | ICD-10-CM | POA: Diagnosis not present

## 2021-02-15 DIAGNOSIS — J320 Chronic maxillary sinusitis: Secondary | ICD-10-CM | POA: Diagnosis not present

## 2021-02-16 ENCOUNTER — Telehealth: Payer: Self-pay | Admitting: Pulmonary Disease

## 2021-02-16 DIAGNOSIS — N390 Urinary tract infection, site not specified: Secondary | ICD-10-CM | POA: Diagnosis not present

## 2021-02-16 DIAGNOSIS — J45909 Unspecified asthma, uncomplicated: Secondary | ICD-10-CM | POA: Diagnosis present

## 2021-02-16 DIAGNOSIS — I10 Essential (primary) hypertension: Secondary | ICD-10-CM | POA: Diagnosis present

## 2021-02-16 DIAGNOSIS — N3000 Acute cystitis without hematuria: Secondary | ICD-10-CM | POA: Diagnosis present

## 2021-02-16 DIAGNOSIS — B961 Klebsiella pneumoniae [K. pneumoniae] as the cause of diseases classified elsewhere: Secondary | ICD-10-CM | POA: Diagnosis present

## 2021-02-16 DIAGNOSIS — R0902 Hypoxemia: Secondary | ICD-10-CM | POA: Diagnosis not present

## 2021-02-16 DIAGNOSIS — I959 Hypotension, unspecified: Secondary | ICD-10-CM | POA: Diagnosis present

## 2021-02-16 DIAGNOSIS — J90 Pleural effusion, not elsewhere classified: Secondary | ICD-10-CM | POA: Diagnosis not present

## 2021-02-16 DIAGNOSIS — Z853 Personal history of malignant neoplasm of breast: Secondary | ICD-10-CM | POA: Diagnosis not present

## 2021-02-16 DIAGNOSIS — Z87440 Personal history of urinary (tract) infections: Secondary | ICD-10-CM | POA: Diagnosis not present

## 2021-02-16 NOTE — Telephone Encounter (Signed)
Pt daughter calling due to pt being hosptilized in [place] for UTI.  pt O2 is dropping down to 80s when she stands up & when want it to be 90s..  The provider at ER has  Substituted  trelegy  for  breo  100 mg & incruise 62.5 mg. pt is wondering if she able to take trelegy with the breo & incruise due to the fact the trelegy was doing great. States breo & incruise aren't helping as well as the trelegy did  please advise [phone]

## 2021-02-16 NOTE — Telephone Encounter (Signed)
Called and spoke with [name] letting her know that pt IS NOT to use the Trelegy with the Incruse and the Breo as Trelegy contains those meds in it.  Stated to her since pt was in the hospital, the hospital probably substituted the Trelegy with Incruse and Breo due to not having Trelegy there.  Stated to her once pt was out of the hospital, if pt wanted to go back to using her Trelegy she could but made it clear that pt SHOULD NOT use the Trelegy and then use the Breo and Incruse on the same day after and Carla [name] verbalized understanding.   Carla [name] wanted to know if we thought that her not having the Trelegy would make her O2 sats be low like they were when she stood at the hospital and I told her that it could be a number of things, especially with her being elderly and having the UTI as that can take a toll on the entire body and she verbalized understanding.  Knowing that pt's O2 sats did drop as low as they did while pt was in the hospital, Dr. Silas Flood, please advise if you would like [place] to get pt scheduled for a HFU once pt is out of the hospital? Carla [name] said that she believes pt might be able to be discharged hopefully tomorrow once she is finished with the IV abx.

## 2021-02-18 NOTE — Telephone Encounter (Signed)
Spoke with daughter Jan, aware of recs. Scheduled to see Beth on 6/3. Nothing further needed at this time- will close encounter.

## 2021-02-26 ENCOUNTER — Encounter: Payer: Self-pay | Admitting: Primary Care

## 2021-02-26 ENCOUNTER — Ambulatory Visit (INDEPENDENT_AMBULATORY_CARE_PROVIDER_SITE_OTHER): Payer: Medicare Other | Admitting: Primary Care

## 2021-02-26 ENCOUNTER — Other Ambulatory Visit: Payer: Self-pay

## 2021-02-26 DIAGNOSIS — J479 Bronchiectasis, uncomplicated: Secondary | ICD-10-CM | POA: Diagnosis not present

## 2021-02-26 MED ORDER — TRELEGY ELLIPTA 200-62.5-25 MCG/INH IN AEPB: 1.0000 | INHALATION_SPRAY | Freq: Every day | RESPIRATORY_TRACT | 0 refills | Status: DC

## 2021-02-26 NOTE — Assessment & Plan Note (Addendum)
Patient was hospitalized briefly in May 2022 for Sepsis related to UTI. She was noted to have transient hypoxia when working with Physical therapy. She is doing well today without acute respiratory complaints. Denies dyspnea symptoms or cough. Ambulatory walk test today did not show any oxygen desaturations. O2 95% RA rest, lowest O2 93% RA after 1 lap. Continue Trelegy and hypertonic saline neb BID with flutter valve. Ok to cancel apt in July. Follow-up in 3 months with Dr. Silas Flood.   Recommendations: - Continue Trelegy 200, one puff daily in morning  - Use hypertonic saline nebulizer minimum of 1-2 times a day followed by  flutter valve. Advised her to increase hypertonic saline nebulizer to 4 times a day during times of acute illness/bronchitis - Call our office if you develop shortness of breath, chest tightness/wheezing or mucus production

## 2021-02-26 NOTE — Patient Instructions (Addendum)
Recommendations: - Continue Trelegy 200, take one puff daily in morning (rinse mouth after use) - Use hypertonic saline nebulizer minimum of 1-2 times a day followed by your flutter valve - Increase hypertonic saline nebulizer to 4 times a day during times of acute illness/bronchitis - Call our office if you develop shortness of breath, chest tightness/wheezing or mucus production   Orders: - Ambulatory walk test today to assess for oxygen desaturations   Follow-up: - 3 months with Dr. Silas Flood or sooner if needed    Bronchiectasis  Bronchiectasis is a condition in which the airways in the lungs (bronchi) are damaged and widened. The condition makes it hard for the lungs to get rid of mucus, and it causes mucus to gather in the bronchi. This condition often leads to lung infections, which can make the condition worse. What are the causes? You can be born with this condition or you can develop it later in life. Common causes of this condition include:  Cystic fibrosis.  Repeated lung infections, such as pneumonia or tuberculosis.  An object or other blockage in the lungs.  Breathing in fluid, food, or other objects (aspiration).  A problem with the immune system and lung structure that is present at birth (congenital). Sometimes the cause is not known. What are the signs or symptoms? Common symptoms of this condition include:  A daily cough that brings up mucus and lasts for more than 3 weeks.  Lung infections that happen often.  Shortness of breath and wheezing.  Weakness and fatigue. How is this diagnosed? This condition is diagnosed with tests, such as:  Chest X-rays or CT scans. These are done to check for changes in the lungs.  Breathing tests. These are done to check how well your lungs are working.  A test of a sample of your saliva (sputum culture). This test is done to check for infection.  Blood tests and other tests. These are done to check for related  diseases or causes. How is this treated? Treatment for this condition depends on the severity of the illness and its cause. Treatment may include:  Medicines that loosen mucus so it can be coughed up (mucolytics).  Medicines that relax the muscles of the bronchi (bronchodilators).  Antibiotic medicines to prevent or treat infection.  Physical therapy to help clear mucus from the lungs. Techniques may include: ? Postural drainage. This is when you sit or lie in certain positions so that mucus can drain by gravity. ? Chest percussion. This involves tapping the chest or back with a cupped hand. ? Chest vibration. For this therapy, a hand or special equipment vibrates your chest and back.  Surgery to remove the affected part of the lung. This may be done in severe cases. Follow these instructions at home: Medicines  Take over-the-counter and prescription medicines only as told by your health care provider.  If you were prescribed an antibiotic medicine, take it as told by your health care provider. Do not stop taking the antibiotic even if you start to feel better.  Avoid taking sedatives and antihistamines unless your health care provider tells you to take them. These medicines tend to thicken the mucus in the lungs. Managing symptoms  Perform breathing exercises or techniques to clear your lungs as told by your health care provider.  Consider using a cold steam vaporizer or humidifier in your room or home to help loosen secretions.  If you have a cough that gets worse at night, try sleeping in a  semi-upright position. General instructions  Get plenty of rest.  Drink enough fluid to keep your urine clear or pale yellow.  Stay inside when pollution and ozone levels are high.  Stay up to date with vaccinations and immunizations.  Avoid cigarette smoke and other lung irritants.  Do not use any products that contain nicotine or tobacco, such as cigarettes and e-cigarettes. If you  need help quitting, ask your health care provider.  Keep all follow-up visits as told by your health care provider. This is important. Contact a health care provider if:  You cough up more sputum than before and the sputum is yellow or green in color.  You have a fever.  You cannot control your cough and are losing sleep. Get help right away if:  You cough up blood.  You have chest pain.  You have increasing shortness of breath.  You have pain that gets worse or is not controlled with medicines.  You have a fever and your symptoms suddenly get worse. Summary  Bronchiectasis is a condition in which the airways in the lungs (bronchi) are damaged and widened. The condition makes it hard for the lungs to get rid of mucus, and it causes mucus to gather in the bronchi.  Treatment usually includes therapy to help clear mucus from the lungs.  Avoid cigarette smoke and other lung irritants.  Stay up to date with vaccinations and immunizations. This information is not intended to replace advice given to you by your health care provider. Make sure you discuss any questions you have with your health care provider. Document Revised: 05/14/2020 Document Reviewed: 05/14/2020 Elsevier Patient Education  2021 [employer].
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
## 2021-05-11 DIAGNOSIS — R922 Inconclusive mammogram: Secondary | ICD-10-CM | POA: Diagnosis not present

## 2021-05-11 DIAGNOSIS — R928 Other abnormal and inconclusive findings on diagnostic imaging of breast: Secondary | ICD-10-CM | POA: Diagnosis not present

## 2021-05-18 NOTE — Assessment & Plan Note (Signed)
Right lumpectomy 05/03/2016: IDC grade 2, 1.3 cm, DCIS with calcifications and necrosis, lymphovascular invasion is identified, margins negative, 0/3 lymph nodes negative, ER 95%, PR 60%, HER-2 negative, Ki-67 20%, T1 cN0 stage IA CHEK-2 mutation  Treatment plan: 1. Patient did not want to go through adjuvant radiation therapy 2. Startedantiestrogen therapy with tamoxifen (prior diagnosis of osteoporosis) on 08/17/2017discontinued December 2017 due to hot flashes  Patient does not want to go on any further antiestrogen therapy. Left breast tenderness: No palpable lumps or nodules. Skin lesions: I instructed her to see dermatology.  Surveillance: 1.Breast exam 05/19/2021: Benign 2.mammogram 05/04/2020:Benign  Return to clinic in1 yearfor follow-upafter mammograms to be done at [facility].

## 2021-05-18 NOTE — Progress Notes (Signed)
  HEMATOLOGY-ONCOLOGY TELEPHONE VISIT PROGRESS NOTE  I connected with Carla [name] on 05/19/2021 at  3:00 PM EDT by telephone and verified that I am speaking with the correct person using two identifiers.  I discussed the limitations, risks, security and privacy concerns of performing an evaluation and management service by telephone and the availability of in person appointments.  I also discussed with the patient that there may be a patient responsible charge related to this service. The patient expressed understanding and agreed to proceed.   History of Present Illness: Carla [name] is a 87 y.o. female with above-mentioned history of right breast cancer treated with lumpectomy, and who declined adjuvant radiation and tamoxifen therapy. She is currently on surveillance. She presents over the phone today for follow-up.  She reports no new problems or concerns with the breast.  Oncology History  Breast cancer of upper-outer quadrant of right female breast ([place])  04/05/2016 Initial Diagnosis   Right breast biopsy 12:00:IDC, LVI present, grade 2-3, ER 95%, PR 60%, HER-2 negative ratio 1.42, Ki-67 20%, screening detected right breast mass 7 mm axilla negative,T1bN0 stage IA clinical stage   05/03/2016 Surgery   Right lumpectomy: IDC grade 2, 1.3 cm, DCIS with calcifications and necrosis, lymphovascular invasion is identified, margins negative, 0/3 lymph nodes negative, ER 95%, PR 60%, HER-2 negative, Ki-67 20%, T1 cN0 stage IA   05/23/2016 - 09/10/2016 Anti-estrogen oral therapy   Tamoxifen 20 mg daily, stopped in December 2017 due to hot flashes patient refused to try any other medication because of osteoporosis   06/20/2016 Genetic Testing   CHEK2 positive. Genes tested include: ATM, BARD1, BRCA1, BRCA2, BRIP1, CDH1, CHEK2, EPCAM, FANCC, MLH1, MSH2, MSH6, NBN, PALB2, PMS2, PTEN, RAD51C, RAD51D, TP53, and XRCC2.        Observations/Objective:     Assessment Plan:  Breast cancer of  upper-outer quadrant of right female breast ([place]) Right lumpectomy 05/03/2016: IDC grade 2, 1.3 cm, DCIS with calcifications and necrosis, lymphovascular invasion is identified, margins negative, 0/3 lymph nodes negative, ER 95%, PR 60%, HER-2 negative, Ki-67 20%, T1 cN0 stage IA CHEK-2 mutation   Treatment plan: 1. Patient did not want to go through adjuvant radiation therapy 2. Started antiestrogen therapy with tamoxifen (prior diagnosis of osteoporosis) on 05/12/2016 discontinued December 2017 due to hot flashes   Patient did not want to go on any further antiestrogen therapy.  Surveillance: mammogram 05/11/2021: Benign   Return to clinic on an as-needed basis.   Mammograms to be done at [facility].    I discussed the assessment and treatment plan with the patient. The patient was provided an opportunity to ask questions and all were answered. The patient agreed with the plan and demonstrated an understanding of the instructions. The patient was advised to call back or seek an in-person evaluation if the symptoms worsen or if the condition fails to improve as anticipated.   Total time spent: 12 mins including non-face to face time and time spent for planning, charting and coordination of care  Rulon Eisenmenger, MD 05/19/2021    I, Thana Ates, am acting as scribe for Nicholas Lose, MD.  I have reviewed the above documentation for accuracy and completeness, and I agree with the above.

## 2021-05-19 ENCOUNTER — Inpatient Hospital Stay: Payer: Medicare Other | Attending: Hematology and Oncology | Admitting: Hematology and Oncology

## 2021-05-19 DIAGNOSIS — Z170 Estrogen receptor positive status [ER+]: Secondary | ICD-10-CM | POA: Diagnosis not present

## 2021-05-19 DIAGNOSIS — C50411 Malignant neoplasm of upper-outer quadrant of right female breast: Secondary | ICD-10-CM

## 2021-05-19 NOTE — Addendum Note (Signed)
Addended by: Nicholas Lose on: 05/19/2021 03:29 PM   Modules accepted: Level of Service

## 2021-05-25 ENCOUNTER — Telehealth: Payer: Self-pay | Admitting: Pulmonary Disease

## 2021-05-25 MED ORDER — TRELEGY ELLIPTA 200-62.5-25 MCG/INH IN AEPB: 1.0000 | INHALATION_SPRAY | Freq: Every day | RESPIRATORY_TRACT | 3 refills | Status: DC

## 2021-05-25 NOTE — Telephone Encounter (Signed)
Received a completed [place] patient assistance application in the mail from the patient. I have printed out a RX for the Trelegy. Will have MH to sign next week once he is back in clinic. Patient requests to have the financial documents mailed back to her. Will do so once everything has been faxed and I have a confirmation.   Leaving encounter open for follow up.

## 2021-05-26 DIAGNOSIS — M25551 Pain in right hip: Secondary | ICD-10-CM | POA: Diagnosis not present

## 2021-06-04 NOTE — Telephone Encounter (Signed)
Application has been completed and signed. I have faxed the application to [place]. I have also made copies to send to scan.   As requested, I have mailed the original copies of all documents back to the patient.   Will close encounter.

## 2021-06-07 ENCOUNTER — Ambulatory Visit (INDEPENDENT_AMBULATORY_CARE_PROVIDER_SITE_OTHER): Payer: Medicare Other | Admitting: Pulmonary Disease

## 2021-06-07 ENCOUNTER — Other Ambulatory Visit: Payer: Self-pay | Admitting: Pulmonary Disease

## 2021-06-07 ENCOUNTER — Encounter: Payer: Self-pay | Admitting: Pulmonary Disease

## 2021-06-07 ENCOUNTER — Other Ambulatory Visit: Payer: Self-pay

## 2021-06-07 VITALS — BP 114/62 | HR 95 | Temp 98.3°F | Ht 61.0 in | Wt 130.8 lb

## 2021-06-07 DIAGNOSIS — J479 Bronchiectasis, uncomplicated: Secondary | ICD-10-CM

## 2021-06-07 MED ORDER — NEBULIZER/TUBING/MOUTHPIECE KIT: 4.0000 | PACK | 11 refills | Status: DC

## 2021-06-07 MED ORDER — TRELEGY ELLIPTA 200-62.5-25 MCG/INH IN AEPB: 1.0000 | INHALATION_SPRAY | Freq: Every day | RESPIRATORY_TRACT | 0 refills | Status: DC

## 2021-06-07 NOTE — Addendum Note (Signed)
Addended by: Fenton Foy on: 06/07/2021 05:00 PM   Modules accepted: Orders

## 2021-06-07 NOTE — Patient Instructions (Addendum)
Neck to see you again  I provided a couple weeks worth of samples of the Trelegy.  We are still waiting to hear back from the drug company.  The forms are in the mail and should be back to you soon.  Please resume the saline nebulizer at least once a day while you are doing well.  If you develop worsening cough or sputum production we may need to increase the frequency to twice a day in the future.  I attempted once again to send orders for new nebulizing tubing and mouthpieces.   Please contact us in early December to request refills for the albuterol nebulizer, Mucinex, hypertonic saline, Trelegy.  Return to clinic in 6 months or sooner as needed
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
## 2021-06-15 ENCOUNTER — Telehealth: Payer: Self-pay | Admitting: Pulmonary Disease

## 2021-06-15 NOTE — Telephone Encounter (Signed)
Pt daughter, Thayer Headings calling because apparently [place] sent in an order/rx to [place] for pt to get nebulizer tubing and mouth piece. Per [employer], they have sent [place] a CMN for this, and have not heard back. Need CMN on file for when insurance authorizes this to be filled(10/6) Any questions [phone]

## 2021-06-15 NOTE — Telephone Encounter (Signed)
I called and spoke with Thayer Headings and she is aware that the order was sent to [employer] on 09/12 and the order was confirmed by them on 09/14.  Nothing further is needed.

## 2021-06-15 NOTE — Telephone Encounter (Signed)
Called and spoke with Carla [name] per DPR to let her know that CMN has been received and that Dr. Silas Flood is not back in the office until 06/22/21. Daughter states that is fine because due to insurance she is not able to get it until 07/01/21. Patient has what she needs right now and will be fine waiting until signed by Dr. Silas Flood.  Will keep until CMN is done.

## 2021-06-15 NOTE — Telephone Encounter (Signed)
CB please advise on the CMN for this pt. It would be for a nebulizer tubing and mouth piece.  Thanks

## 2021-06-15 NOTE — Telephone Encounter (Signed)
I just got it like 10 mins ago and [place] is not back until 06/22/21 to sign it

## 2021-06-28 DIAGNOSIS — Z23 Encounter for immunization: Secondary | ICD-10-CM | POA: Diagnosis not present

## 2021-07-01 NOTE — Telephone Encounter (Signed)
Will close encounter since this has been addressed.

## 2021-07-01 NOTE — Telephone Encounter (Signed)
CMN was signed and faxed back on 06/29/21

## 2021-07-01 NOTE — Telephone Encounter (Signed)
Has this been addressed? Thanks! 

## 2021-07-19 ENCOUNTER — Other Ambulatory Visit: Payer: Self-pay | Admitting: Pulmonary Disease

## 2021-08-03 ENCOUNTER — Ambulatory Visit (HOSPITAL_COMMUNITY)
Admission: RE | Admit: 2021-08-03 | Discharge: 2021-08-03 | Disposition: A | Discharge status: Home / Self Care | Payer: Medicare Other | Source: Ambulatory Visit | Attending: Internal Medicine | Admitting: Internal Medicine

## 2021-08-03 ENCOUNTER — Other Ambulatory Visit: Payer: Self-pay

## 2021-08-03 ENCOUNTER — Other Ambulatory Visit (HOSPITAL_COMMUNITY): Payer: Self-pay | Admitting: Internal Medicine

## 2021-08-03 DIAGNOSIS — J479 Bronchiectasis, uncomplicated: Secondary | ICD-10-CM | POA: Diagnosis not present

## 2021-08-03 DIAGNOSIS — I700 Atherosclerosis of aorta: Secondary | ICD-10-CM | POA: Insufficient documentation

## 2021-08-03 DIAGNOSIS — Z23 Encounter for immunization: Secondary | ICD-10-CM | POA: Diagnosis not present

## 2021-08-03 DIAGNOSIS — I10 Essential (primary) hypertension: Secondary | ICD-10-CM | POA: Diagnosis not present

## 2021-08-03 DIAGNOSIS — W19XXXS Unspecified fall, sequela: Secondary | ICD-10-CM

## 2021-08-03 DIAGNOSIS — M5136 Other intervertebral disc degeneration, lumbar region: Secondary | ICD-10-CM | POA: Insufficient documentation

## 2021-08-03 DIAGNOSIS — Z90710 Acquired absence of both cervix and uterus: Secondary | ICD-10-CM | POA: Diagnosis not present

## 2021-08-03 DIAGNOSIS — K219 Gastro-esophageal reflux disease without esophagitis: Secondary | ICD-10-CM | POA: Diagnosis not present

## 2021-08-03 DIAGNOSIS — M25551 Pain in right hip: Secondary | ICD-10-CM | POA: Insufficient documentation

## 2021-08-03 DIAGNOSIS — M47819 Spondylosis without myelopathy or radiculopathy, site unspecified: Secondary | ICD-10-CM | POA: Diagnosis not present

## 2021-08-03 DIAGNOSIS — M4316 Spondylolisthesis, lumbar region: Secondary | ICD-10-CM | POA: Diagnosis not present

## 2021-08-03 DIAGNOSIS — Z79899 Other long term (current) drug therapy: Secondary | ICD-10-CM | POA: Diagnosis not present

## 2021-08-03 DIAGNOSIS — Z853 Personal history of malignant neoplasm of breast: Secondary | ICD-10-CM | POA: Diagnosis not present

## 2021-08-03 DIAGNOSIS — Z0000 Encounter for general adult medical examination without abnormal findings: Secondary | ICD-10-CM | POA: Diagnosis not present

## 2021-08-03 DIAGNOSIS — F419 Anxiety disorder, unspecified: Secondary | ICD-10-CM | POA: Diagnosis not present

## 2021-08-06 ENCOUNTER — Telehealth: Payer: Self-pay | Admitting: Pulmonary Disease

## 2021-08-06 NOTE — Telephone Encounter (Signed)
MH please advise.    I did not see anything in the last OV note about the vest.  thanks

## 2021-08-09 NOTE — Telephone Encounter (Signed)
Do not think vest therapy is needed at this time. Can discuss at next visit if they would like.

## 2021-08-09 NOTE — Telephone Encounter (Signed)
I called the patient daughter (DPR)  and she reports that she was told by someone at [employer] that measurements were needed for a vest. I do not see an order for vest therapy.   I called [employer] to cancel the order and was placed on hold for a long time. I called [facility] and let them know know to cancel the order. Nothing further needed.

## 2021-08-18 ENCOUNTER — Ambulatory Visit (INDEPENDENT_AMBULATORY_CARE_PROVIDER_SITE_OTHER): Payer: Medicare Other

## 2021-08-18 ENCOUNTER — Ambulatory Visit (INDEPENDENT_AMBULATORY_CARE_PROVIDER_SITE_OTHER): Payer: Medicare Other | Admitting: Orthopaedic Surgery

## 2021-08-18 ENCOUNTER — Other Ambulatory Visit: Payer: Self-pay

## 2021-08-18 DIAGNOSIS — M25551 Pain in right hip: Secondary | ICD-10-CM

## 2021-08-18 DIAGNOSIS — M545 Low back pain, unspecified: Secondary | ICD-10-CM | POA: Diagnosis not present

## 2021-08-18 MED ORDER — METHYLPREDNISOLONE 4 MG PO TABS: ORAL_TABLET | ORAL | 0 refills | Status: DC

## 2021-08-18 NOTE — Progress Notes (Signed)
The patient is a very pleasant 87 year old female who comes in for evaluation treatment of right-sided low back pain and pelvic pain.  She is ambulating with a rolling walker.  She had a mechanical fall down on her backside back in August and since then has still had significant pain.  She has been on Tylenol PM which is helped some.  The pain radiates in her low back and just into the sciatic region but not even down to her knee at all.  She denies any groin pain on the left side or right side.  She points to the posterior pelvis but also low back as source of her pain.  She denies any change in bowel bladder function.  She denies any radicular symptoms.  She cannot take traditional anti-inflammatories due to a history of gastric ulcers.  She is a petite individual.  She denies any change in bowel or bladder function.  On exam she gets up from a chair easily.  She does have reproducible pain in the posterior pelvis on the right side and some of the midline but also the lower back.  Both hips move smoothly including no pain in the hips at all.  She has negative straight leg raise bilaterally and good strength in her bilateral lower extremities.  An AP pelvis and lateral of the right hip shows well-maintained joint spaces on both hips.  There is significant right-sided SI joint arthritic changes.  There is no evidence of fracture or other acute findings.  An AP and lateral lumbar spine shows an L2 compression fracture that appears subacute.  There is about 25% loss of height but no significant malalignment.  There is a grade 1 anterolisthesis of L4 on L5.  I would like to send her for a MRI of her lumbar spine to assess the L2 compression fracture but also assess for significant arthritic changes in the lower aspect of her lumbar spine and potentially even nerve compression to the right side.  I have a feeling that we will be recommending a right-sided SI joint injection and potentially a right-sided epidural  injection but we need to see what the MRI shows.  I will at least send in a 6-day steroid taper since she is not a diabetic.  All questions and concerns were answered and addressed.  We will see her in follow-up after we have the results of the MRI.

## 2021-08-23 ENCOUNTER — Telehealth: Payer: Self-pay | Admitting: Orthopaedic Surgery

## 2021-08-23 ENCOUNTER — Other Ambulatory Visit: Payer: Self-pay

## 2021-08-23 DIAGNOSIS — M4807 Spinal stenosis, lumbosacral region: Secondary | ICD-10-CM

## 2021-08-23 NOTE — Telephone Encounter (Signed)
Called and advised to let us know once she has her MRI done to review. She stated understanding

## 2021-08-23 NOTE — Telephone Encounter (Signed)
Pt's daughter Thayer Headings called requesting a call back. She states that Dr. Ninfa Linden told her they don't need an appt for MRI review need appt for injection. Informed pt that MRI has to be reviewed. The pt daughter would like a call back at [phone].

## 2021-08-23 NOTE — Telephone Encounter (Signed)
Please advise 

## 2021-08-24 ENCOUNTER — Telehealth: Payer: Self-pay | Admitting: Orthopaedic Surgery

## 2021-08-24 NOTE — Telephone Encounter (Signed)
Pt was returning a call from a michael?

## 2021-09-07 ENCOUNTER — Telehealth: Payer: Self-pay | Admitting: Orthopaedic Surgery

## 2021-09-07 ENCOUNTER — Ambulatory Visit
Admission: RE | Admit: 2021-09-07 | Discharge: 2021-09-07 | Disposition: A | Discharge status: Home / Self Care | Payer: Medicare Other | Source: Ambulatory Visit | Attending: Orthopaedic Surgery | Admitting: Orthopaedic Surgery

## 2021-09-07 DIAGNOSIS — M4316 Spondylolisthesis, lumbar region: Secondary | ICD-10-CM | POA: Diagnosis not present

## 2021-09-07 DIAGNOSIS — M4807 Spinal stenosis, lumbosacral region: Secondary | ICD-10-CM

## 2021-09-07 DIAGNOSIS — M48061 Spinal stenosis, lumbar region without neurogenic claudication: Secondary | ICD-10-CM | POA: Diagnosis not present

## 2021-09-07 NOTE — Telephone Encounter (Signed)
Patient's daughter Jan called advised patient has had her MRI. Jan said Dr Ninfa Linden wanted to know as soon as patient's MRI was done. The number to contact Jan is [phone]

## 2021-09-07 NOTE — Telephone Encounter (Signed)
Waiting on report

## 2021-09-08 NOTE — Telephone Encounter (Signed)
Report in chart. Please advise

## 2021-10-01 ENCOUNTER — Ambulatory Visit: Payer: Medicare Other | Admitting: Pulmonary Disease

## 2021-10-04 ENCOUNTER — Other Ambulatory Visit: Payer: Self-pay

## 2021-10-04 ENCOUNTER — Ambulatory Visit (INDEPENDENT_AMBULATORY_CARE_PROVIDER_SITE_OTHER): Payer: Medicare Other | Admitting: Pulmonary Disease

## 2021-10-04 ENCOUNTER — Encounter: Payer: Self-pay | Admitting: Pulmonary Disease

## 2021-10-04 VITALS — BP 120/72 | HR 75 | Temp 98.2°F | Ht 61.0 in | Wt 129.6 lb

## 2021-10-04 DIAGNOSIS — J45991 Cough variant asthma: Secondary | ICD-10-CM

## 2021-10-04 DIAGNOSIS — J479 Bronchiectasis, uncomplicated: Secondary | ICD-10-CM | POA: Diagnosis not present
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
## 2021-10-04 NOTE — Patient Instructions (Signed)
Nice to see you again  I am glad you are doing so well  No medication changes today  Please call if you need refills or with any concerns  Return to clinic in 6 months or sooner as needed with Dr. Silas Flood

## 2021-10-13 ENCOUNTER — Ambulatory Visit: Payer: Medicare Other | Admitting: Orthopaedic Surgery

## 2021-10-14 ENCOUNTER — Encounter: Payer: Self-pay | Admitting: Orthopaedic Surgery

## 2021-10-14 ENCOUNTER — Ambulatory Visit (INDEPENDENT_AMBULATORY_CARE_PROVIDER_SITE_OTHER): Payer: Medicare Other | Admitting: Orthopaedic Surgery

## 2021-10-14 ENCOUNTER — Ambulatory Visit (INDEPENDENT_AMBULATORY_CARE_PROVIDER_SITE_OTHER): Payer: Medicare Other

## 2021-10-14 DIAGNOSIS — S32020D Wedge compression fracture of second lumbar vertebra, subsequent encounter for fracture with routine healing: Secondary | ICD-10-CM

## 2021-10-14 DIAGNOSIS — S32010A Wedge compression fracture of first lumbar vertebra, initial encounter for closed fracture: Secondary | ICD-10-CM

## 2021-10-14 DIAGNOSIS — S32030D Wedge compression fracture of third lumbar vertebra, subsequent encounter for fracture with routine healing: Secondary | ICD-10-CM

## 2021-10-14 MED ORDER — TRAMADOL HCL 50 MG PO TABS
50.0000 mg | ORAL_TABLET | Freq: Four times a day (QID) | ORAL | 0 refills | Status: DC | PRN
Start: 2021-10-14 — End: 2022-01-03

## 2021-10-14 MED ORDER — MELOXICAM 7.5 MG PO TABS: 7.5000 mg | ORAL_TABLET | Freq: Every day | ORAL | 0 refills | Status: DC

## 2021-10-14 NOTE — Progress Notes (Signed)
The patient is a 88 year old female that has been having worsening low back pain since she fell in August.  I did obtain an MRI of her lumbar spine.  It did show acute to subacute compression fractures at L2 and L3.  She also had bilateral sacral insufficiency fractures.  She has had 1 fall since then and really earlier in the week was significantly having severe low back pain.  She has had low back pain for a long period of time.  The MRI also showed significant facet arthritis at L4-L5 and L5-S1.  There is no radicular component of her pain.  I did obtain new plain films of her lumbar spine today and compared them to plain films from November.  There is actually a new compression fracture now at L1.  There is about 20% loss of height.  This correlates with where she is hurting.  Before she fell last week and sustained this injury she was actually feeling better from her other compression fractures.  At this point we will try meloxicam as a once a day anti-inflammatory as well as tramadol for more severe pain.  I would like to see her back in 1 week with a repeat lateral lumbar spine film so we can document whether or not she is losing more height in her vertebral bodies to consider sending her to interventional radiology.

## 2021-10-21 ENCOUNTER — Ambulatory Visit (INDEPENDENT_AMBULATORY_CARE_PROVIDER_SITE_OTHER): Payer: Medicare Other | Admitting: Physician Assistant

## 2021-10-21 ENCOUNTER — Ambulatory Visit: Payer: Self-pay

## 2021-10-21 ENCOUNTER — Other Ambulatory Visit: Payer: Self-pay

## 2021-10-21 ENCOUNTER — Encounter: Payer: Self-pay | Admitting: Physician Assistant

## 2021-10-21 DIAGNOSIS — S32010D Wedge compression fracture of first lumbar vertebra, subsequent encounter for fracture with routine healing: Secondary | ICD-10-CM

## 2021-10-21 NOTE — Addendum Note (Signed)
Addended by: Robyne Peers on: 10/21/2021 04:45 PM   Modules accepted: Orders
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
## 2021-10-25 ENCOUNTER — Telehealth: Payer: Self-pay | Admitting: Orthopaedic Surgery

## 2021-10-25 NOTE — Telephone Encounter (Signed)
Patient's daughter called. Says her mom has decided not to do the [place]. Her call back number is [phone]

## 2021-11-22 ENCOUNTER — Ambulatory Visit: Payer: Self-pay

## 2021-11-22 ENCOUNTER — Ambulatory Visit (INDEPENDENT_AMBULATORY_CARE_PROVIDER_SITE_OTHER): Payer: Medicare Other | Admitting: Physician Assistant

## 2021-11-22 ENCOUNTER — Ambulatory Visit: Payer: Medicare Other | Admitting: Physician Assistant

## 2021-11-22 ENCOUNTER — Encounter: Payer: Self-pay | Admitting: Physician Assistant

## 2021-11-22 DIAGNOSIS — S32010D Wedge compression fracture of first lumbar vertebra, subsequent encounter for fracture with routine healing: Secondary | ICD-10-CM | POA: Diagnosis not present

## 2021-11-22 MED ORDER — MELOXICAM 7.5 MG PO TABS: 7.5000 mg | ORAL_TABLET | Freq: Every day | ORAL | 0 refills | Status: DC

## 2021-11-22 NOTE — Progress Notes (Signed)
HPI: Ms. Rumpf returns today follow-up of her L1 compression fracture.  She states that the pain is constant now.  She states the pain is on the left side and points to the left lower lumbar paraspinous region.  She still denies any radicular symptoms down either leg.  She does feel that the Mobic did help some with her pain.  She did not undergo kyphoplasty of L1.  Currently she notes that the pain is worse with standing. She underwent an MRI of her back in December 22 prior to her fall that showed diffuse lumbar disc and facet degeneration most noted at L4-5 with grade 1 anterior listhesis.  Resulting in severe spinal stenosis and moderate neural foraminal stenosis right greater than left. ROS: See HPI  Physical exam: General: Well-developed no acute distress. Lumbar spine: No tenderness over the spinal column.  She has no significant tenderness to the paraspinous region lower lumbar spine bilaterally.  Radiographs: Lateral view of the lumbar spine shows the L1 compression fracture to be is stable.  There is some very early signs of consolidation.  Subacute L2 compression fracture seen.  Impression: Low back pain without radicular symptoms Acute L1 compression fracture  Plan: Given her left lower lumbar paraspinous pain which is worse with standing and findings on MRI recommend possible L4-5 facet injection with Dr. Ernestina Patches.  We will see about getting this set up.  She will take Mobic 7.5 mg 1 daily on the days when she is having severe pain.  She will follow-up with [place] on an as-needed basis pain persist or becomes worse.  Questions were encouraged and answered at length today.  Questions were encouraged and answered.  Family was present today.

## 2021-11-26 ENCOUNTER — Telehealth: Payer: Self-pay | Admitting: Physical Medicine and Rehabilitation

## 2021-11-26 NOTE — Telephone Encounter (Signed)
Patient's daughter called. She would like to make appointment for her mom to see Dr. Ernestina Patches. Her call back number is [phone] ?

## 2021-12-14 ENCOUNTER — Ambulatory Visit (INDEPENDENT_AMBULATORY_CARE_PROVIDER_SITE_OTHER): Payer: Medicare Other | Admitting: Physical Medicine and Rehabilitation

## 2021-12-14 ENCOUNTER — Other Ambulatory Visit: Payer: Self-pay

## 2021-12-14 ENCOUNTER — Ambulatory Visit: Payer: Self-pay

## 2021-12-14 ENCOUNTER — Encounter: Payer: Self-pay | Admitting: Physical Medicine and Rehabilitation

## 2021-12-14 VITALS — BP 125/77 | HR 79

## 2021-12-14 DIAGNOSIS — M47816 Spondylosis without myelopathy or radiculopathy, lumbar region: Secondary | ICD-10-CM | POA: Diagnosis not present

## 2021-12-14 MED ORDER — METHYLPREDNISOLONE ACETATE 80 MG/ML IJ SUSP
80.0000 mg | Freq: Once | INTRAMUSCULAR | Status: AC
  Administered 2021-12-14: 80 mg

## 2021-12-14 NOTE — Progress Notes (Signed)
Patient presents today for left L4/L5 facet block. Pain is rated at 9/10 and states that she is currently taking OTC tylenol extra strength and MOBIC. At this time she is currently ambulating with a walker. Patient does mention that she is currently taking aspirin 81 mg which was last taken this AM. ?

## 2021-12-14 NOTE — Patient Instructions (Signed)

## 2021-12-20 ENCOUNTER — Telehealth: Payer: Self-pay | Admitting: Physical Medicine and Rehabilitation

## 2021-12-20 NOTE — Telephone Encounter (Signed)
Patient's daughter Thayer Headings called needing to reschedule patient's appointment. Thayer Headings asked that she be called and not her mother. The number to contact Thayer Headings is [phone] ?

## 2021-12-24 NOTE — Progress Notes (Signed)
? ?Carla [name] - 88 y.o. female MRN [id number]  Date of birth: Aug 06, 1926 ? ?Office Visit Note: ?Visit Date: 12/14/2021 ?PCP: [name], MD ?Referred by: [name], MD ? ?Subjective: ?Chief Complaint  ?Patient presents with  ? Lower Back - Follow-up  ? ?HPI:  Carla [name] is a 88 y.o. female who comes in today at the request of Carla Stabile, PA-C for planned Left  L4-5 Lumbar facet/medial branch block with fluoroscopic guidance.  The patient has failed conservative care including home exercise, medications, time and activity modification.  This injection will be diagnostic and hopefully therapeutic.  Please see requesting physician notes for further details and justification.  Exam has shown concordant pain with facet joint loading. ? ?ROS Otherwise per HPI. ? ?Assessment & Plan: ?Visit Diagnoses:  ?  ICD-10-CM   ?1. Spondylosis without myelopathy or radiculopathy, lumbar region  M47.816 XR C-ARM NO REPORT  ?  Facet Injection  ?  methylPREDNISolone acetate (DEPO-MEDROL) injection 80 mg  ?  ?  ?Plan: No additional findings.  ? ?Meds & Orders:  ?Meds ordered this encounter  ?Medications  ? methylPREDNISolone acetate (DEPO-MEDROL) injection 80 mg  ?  ?Orders Placed This Encounter  ?Procedures  ? Facet Injection  ? XR C-ARM NO REPORT  ?  ?Follow-up: Return in about 2 weeks (around 12/28/2021), or if symptoms worsen or fail to improve.  ? ?Procedures: ?No procedures performed  ?Lumbar Facet Joint Intra-Articular Injection(s) with Fluoroscopic Guidance ? ?Patient: Carla [name]      ?Date of Birth: Feb 14, 1926 ?MRN: [id number] ?PCP: [name], MD      ?Visit Date: 12/14/2021 ?  ?Universal Protocol:    ?Date/Time: 12/14/2021 ? ?Consent Given By: the patient ? ?Position: PRONE  ? ?Additional Comments: ?Vital signs were monitored before and after the procedure. ?Patient was prepped and draped in the usual sterile fashion. ?The correct patient, procedure, and site was verified. ? ? ?Injection Procedure Details:   ?Procedure Site One ?Meds Administered:  ?Meds ordered this encounter  ?Medications  ? methylPREDNISolone acetate (DEPO-MEDROL) injection 80 mg  ?  ? ?Laterality: Left ? ?Location/Site:  ?L4-L5 ? ?Needle size: 22 guage ? ?Needle type: Spinal ? ?Needle Placement: Articular ? ?Findings: ? -Comments: Excellent flow of contrast producing a partial arthrogram. ? ?Procedure Details: ?The fluoroscope beam is vertically oriented in AP, and the inferior recess is visualized beneath the lower pole of the inferior apophyseal process, which represents the target point for needle insertion. When direct visualization is difficult the target point is located at the medial projection of the vertebral pedicle. The region overlying each aforementioned target is locally anesthetized with a 1 to 2 ml. volume of 1% Lidocaine without Epinephrine.  ? ?The spinal needle was inserted into each of the above mentioned facet joints using biplanar fluoroscopic guidance. A 0.25 to 0.5 ml. volume of Isovue-250 was injected and a partial facet joint arthrogram was obtained. A single spot film was obtained of the resulting arthrogram.    One to 1.25 ml of the steroid/anesthetic solution was then injected into each of the facet joints noted above. ? ? ?Additional Comments:  ?The patient tolerated the procedure well ?Dressing: 2 x 2 sterile gauze and Band-Aid ?  ? ?Post-procedure details: ?Patient was observed during the procedure. ?Post-procedure instructions were reviewed. ? ?Patient left the clinic in stable condition. ?  ? ?Clinical History: ?No specialty comments available.  ? ? ? ?Objective:  VS:  HT:    WT:   BMI:  BP:125/77  HR:79bpm  TEMP: ( )  RESP:95 % ?Physical Exam ?Vitals and nursing note reviewed.  ?Constitutional:   ?   General: She is not in acute distress. ?   Appearance: Normal appearance. She is not ill-appearing.  ?HENT:  ?   Head: Normocephalic and atraumatic.  ?   Right Ear: External ear normal.  ?   Left Ear:  External ear normal.  ?Eyes:  ?   Extraocular Movements: Extraocular movements intact.  ?Cardiovascular:  ?   Rate and Rhythm: Normal rate.  ?   Pulses: Normal pulses.  ?Pulmonary:  ?   Effort: Pulmonary effort is normal. No respiratory distress.  ?Abdominal:  ?   General: There is no distension.  ?   Palpations: Abdomen is soft.  ?Musculoskeletal:     ?   General: Tenderness present.  ?   Cervical back: Neck supple.  ?   Right lower leg: No edema.  ?   Left lower leg: No edema.  ?   Comments: Patient has good distal strength with no pain over the greater trochanters.  No clonus or focal weakness.  ?Skin: ?   Findings: No erythema, lesion or rash.  ?Neurological:  ?   General: No focal deficit present.  ?   Mental Status: She is alert and oriented to person, place, and time.  ?   Sensory: No sensory deficit.  ?   Motor: No weakness or abnormal muscle tone.  ?   Coordination: Coordination normal.  ?Psychiatric:     ?   Mood and Affect: Mood normal.     ?   Behavior: Behavior normal.  ?  ? ?Imaging: ?No results found. ?

## 2021-12-24 NOTE — Procedures (Signed)
Lumbar Facet Joint Intra-Articular Injection(s) with Fluoroscopic Guidance ? ?Patient: Carla [name]      ?Date of Birth: 1926/08/18 ?MRN: [id number] ?PCP: [name], MD      ?Visit Date: 12/14/2021 ?  ?Universal Protocol:    ?Date/Time: 12/14/2021 ? ?Consent Given By: the patient ? ?Position: PRONE  ? ?Additional Comments: ?Vital signs were monitored before and after the procedure. ?Patient was prepped and draped in the usual sterile fashion. ?The correct patient, procedure, and site was verified. ? ? ?Injection Procedure Details:  ?Procedure Site One ?Meds Administered:  ?Meds ordered this encounter  ?Medications  ? methylPREDNISolone acetate (DEPO-MEDROL) injection 80 mg  ?  ? ?Laterality: Left ? ?Location/Site:  ?L4-L5 ? ?Needle size: 22 guage ? ?Needle type: Spinal ? ?Needle Placement: Articular ? ?Findings: ? -Comments: Excellent flow of contrast producing a partial arthrogram. ? ?Procedure Details: ?The fluoroscope beam is vertically oriented in AP, and the inferior recess is visualized beneath the lower pole of the inferior apophyseal process, which represents the target point for needle insertion. When direct visualization is difficult the target point is located at the medial projection of the vertebral pedicle. The region overlying each aforementioned target is locally anesthetized with a 1 to 2 ml. volume of 1% Lidocaine without Epinephrine.  ? ?The spinal needle was inserted into each of the above mentioned facet joints using biplanar fluoroscopic guidance. A 0.25 to 0.5 ml. volume of Isovue-250 was injected and a partial facet joint arthrogram was obtained. A single spot film was obtained of the resulting arthrogram.    One to 1.25 ml of the steroid/anesthetic solution was then injected into each of the facet joints noted above. ? ? ?Additional Comments:  ?The patient tolerated the procedure well ?Dressing: 2 x 2 sterile gauze and Band-Aid ?  ? ?Post-procedure details: ?Patient was observed during  the procedure. ?Post-procedure instructions were reviewed. ? ?Patient left the clinic in stable condition. ? ?

## 2021-12-30 ENCOUNTER — Ambulatory Visit: Payer: Medicare Other | Admitting: Physical Medicine and Rehabilitation

## 2022-01-03 ENCOUNTER — Encounter: Payer: Self-pay | Admitting: Physical Medicine and Rehabilitation

## 2022-01-03 ENCOUNTER — Ambulatory Visit (INDEPENDENT_AMBULATORY_CARE_PROVIDER_SITE_OTHER): Payer: Medicare Other | Admitting: Physical Medicine and Rehabilitation

## 2022-01-03 VITALS — BP 137/76 | HR 87

## 2022-01-03 DIAGNOSIS — R269 Unspecified abnormalities of gait and mobility: Secondary | ICD-10-CM | POA: Diagnosis not present

## 2022-01-03 DIAGNOSIS — M47816 Spondylosis without myelopathy or radiculopathy, lumbar region: Secondary | ICD-10-CM

## 2022-01-03 DIAGNOSIS — M48062 Spinal stenosis, lumbar region with neurogenic claudication: Secondary | ICD-10-CM | POA: Diagnosis not present

## 2022-01-03 DIAGNOSIS — S32010D Wedge compression fracture of first lumbar vertebra, subsequent encounter for fracture with routine healing: Secondary | ICD-10-CM | POA: Diagnosis not present

## 2022-01-03 DIAGNOSIS — M5416 Radiculopathy, lumbar region: Secondary | ICD-10-CM | POA: Diagnosis not present

## 2022-01-03 NOTE — Progress Notes (Signed)
Pt state lower back pain that travels to her buttocks. Pt state walking, standing and bending makes the pain worse. Pt state she takes over the counter pain meds to help ease her pain. Pt has hx of in on 12/14/21 pt state helped but didn't last long. Pt state she had more then 60% of relief. ? ?Numeric Pain Rating Scale and Functional Assessment ?Average Pain 8 ?Pain Right Now 5 ?My pain is constant, sharp, and stabbing ?Pain is worse with: walking, bending, standing, and some activites ?Pain improves with: medication and injections ? ? ?In the last MONTH (on 0-10 scale) has pain interfered with the following? ? ?1. General activity like being  able to carry out your everyday physical activities such as walking, climbing stairs, carrying groceries, or moving a chair?  ?Rating(5) ? ?2. Relation with others like being able to carry out your usual social activities and roles such as  activities at home, at work and in your community. ?Rating(6) ? ?3. Enjoyment of life such that you have  been bothered by emotional problems such as feeling anxious, depressed or irritable?  ?Rating(7) ? ?

## 2022-01-03 NOTE — Progress Notes (Signed)
? ?Carla [name] - 88 y.o. female MRN [id number]  Date of birth: [birth year] ? ?Office Visit Note: ?Visit Date: 01/03/2022 ?PCP: [name], MD ?Referred by: [name], MD ? ?Subjective: ?Chief Complaint  ?Patient presents with  ? Lower Back - Pain  ? ?HPI: Carla [name] is a 88 y.o. female who comes in today for evaluation of chronic, worsening and severe bilateral lower back pain radiating to buttocks and bilateral hips, left greater than right.  Patient states her pain has recently started to radiate into her buttocks and hip regions bilaterally, she reports this is somewhat of a new pain.  Patient is poor historian, her daughters are present during our visit today. Patient reports pain has been ongoing since mechanical fall in August. Patient reports pain is exacerbated by walking, standing and activity, describes as constant sore and aching sensation, currently rates as 5 out of 10. Patient reports some relief of pain with rest and use of medications. Patients lumbar MRI from 2022 exhibits acute subacute L2/L3 compression fractures and acute bilateral sacral fractures. There is also grade 1 anterolisthesis, severe spinal canal stenosis and moderate foraminal stenosis at L4-L5. Plain lumbar x-ray films from January 2023 exhibit new compression fracture at L1.  Patient recently had a left L4-L5 intra-articular facet joint injection performed in our office, and she reports less than 50% pain relief with this procedure.  Per her daughters they feel this injection helped her significantly, they report her functional mobility has increased and she is also walking without assistance at home, however patient reports she continues to have significant pain. Patient is currently being treated by Dr. Jean Rosenthal and Erskine Emery, PA from an orthopedic standpoint. Patient did not undergo kyphoplasty for L1 compression fracture as she does not feel this is the best option for her. Patient is currently  taking Meloxicam as needed for moderate/severe pain. Patient does use walker intermittently to assist with ambulation and prevent falls. Patient denies focal weakness. Patient denies recent trauma or falls.  ? ? ?Review of Systems  ?Musculoskeletal:  Positive for back pain.  ?Neurological:  Negative for tingling, sensory change, focal weakness and weakness.  ?All other systems reviewed and are negative. Otherwise per HPI. ? ?Assessment & Plan: ?Visit Diagnoses:  ?  ICD-10-CM   ?1. Lumbar radiculopathy  M54.16   ?  ?2. Spinal stenosis of lumbar region with neurogenic claudication  M48.062   ?  ?3. Facet arthropathy, lumbar  M47.816   ?  ?4. Gait abnormality  R26.9   ?  ?5. Closed compression fracture of L1 lumbar vertebra with routine healing, subsequent encounter  S32.010D   ?  ?   ?Plan: Findings:  ?Chronic, worsening and severe bilateral lower back pain radiating to buttocks and bilateral hips, left greater than right.  Patient continues to have severe pain despite good conservative therapy such as rest and use of medications.  Patient's clinical presentation and exam are consistent with neurogenic claudication as a result of spinal canal stenosis.  She does have severe spinal canal stenosis noted at L4-L5.  We believe the next step is to perform a diagnostic and hopefully therapeutic left L5-S1 interlaminar epidural steroid injection under fluoroscopic guidance.  Patient is not currently undergoing long-term anticoagulant therapy.  We feel that we can get patient in quickly for her injection.  Patient encouraged to remain active as tolerated.  No red flag symptoms noted upon exam today.  ? ?Meds & Orders: No orders of the defined types were  placed in this encounter. ? No orders of the defined types were placed in this encounter. ?  ?Follow-up: Return for Left L5-S1 interlaminar epidural steroid injection.  ? ?Procedures: ?No procedures performed  ?   ? ?Clinical History: ?EXAM: ?MRI LUMBAR SPINE WITHOUT  CONTRAST ?  ?TECHNIQUE: ?Multiplanar, multisequence MR imaging of the lumbar spine was ?performed. No intravenous contrast was administered. ?  ?COMPARISON:  Lumbar spine radiographs 08/18/2021 ?  ?FINDINGS: ?Segmentation: Transitional lumbosacral anatomy with partial ?sacralization of L5. Pseudoarticulation between an enlarged left L5 ?transverse process and the sacrum. ?  ?Alignment: Facet mediated anterolisthesis of L4 on L5 measuring 7 ?mm. ?  ?Vertebrae: L2 superior plate compression fracture with 35% anterior ?vertebral body height loss and mild marrow edema, similar to the ?prior radiographs. L3 superior endplate compression fracture with ?20% vertebral body height loss, not apparent on the prior ?radiographs and with mild-to-moderate marrow edema. Partially ?visualized extensive bilateral sacral ala edema related to ?insufficiency type fractures. No suspicious marrow lesion. ?  ?Conus medullaris and cauda equina: Conus extends to the L1 level. ?Conus and cauda equina appear normal. ?  ?Paraspinal and other soft tissues: Subcentimeter T2 hyperintensity ?in the left kidney, likely a cyst. ?  ?Disc levels: ?  ?Disc desiccation throughout the lumbar and included lower thoracic ?spine with multilevel disc space narrowing, moderate at L4-5. ?  ?T11-12: Mild disc bulging without stenosis. ?  ?T12-L1: Minimal disc bulging and mild facet and ligamentum flavum ?hypertrophy without stenosis. ?  ?L1-2: Disc bulging and moderate facet and ligamentum flavum ?hypertrophy result in mild bilateral lateral recess stenosis without ?spinal or neural foraminal stenosis. ?  ?L2-3: Disc bulging and moderate facet and ligamentum flavum ?hypertrophy result in mild spinal stenosis without spinal or neural ?foraminal stenosis. ?  ?L3-4: Disc bulging and moderate facet and ligamentum flavum ?hypertrophy result in moderate spinal stenosis, mild bilateral ?lateral recess stenosis, and mild bilateral neural foraminal ?stenosis. ?  ?L4-5:  Anterolisthesis with bulging uncovered disc and severe facet ?and ligamentum flavum hypertrophy result in markedly severe spinal ?stenosis and moderate right greater than left neural foraminal ?stenosis. ?  ?L5-S1: Moderate to severe facet and ligamentum flavum hypertrophy ?result in mild-to-moderate right lateral recess stenosis without ?significant generalized spinal stenosis or neural foraminal ?stenosis. ?  ?IMPRESSION: ?1. Acute/subacute L2 and L3 compression fractures. ?2. Acute bilateral sacral fractures. ?3. Diffuse lumbar disc and facet degeneration, most notable at L4-5 ?where there is grade 1 anterolisthesis, markedly severe spinal ?stenosis, and moderate neural foraminal stenosis. ?  ?  ?Electronically Signed ?  By: Logan Bores M.D. ?  On: 09/08/2021 14:07  ? ?She reports that she has never smoked. She has never used smokeless tobacco. No results for input(s): HGBA1C, LABURIC in the last 8760 hours. ? ?Objective:  VS:  HT:    WT:   BMI:     BP:137/76  HR:87bpm  TEMP: ( )  RESP:  ?Physical Exam ?Vitals and nursing note reviewed.  ?HENT:  ?   Head: Normocephalic and atraumatic.  ?   Right Ear: External ear normal.  ?   Left Ear: External ear normal.  ?   Nose: Nose normal.  ?   Mouth/Throat:  ?   Mouth: Mucous membranes are moist.  ?Eyes:  ?   Extraocular Movements: Extraocular movements intact.  ?Cardiovascular:  ?   Rate and Rhythm: Normal rate.  ?   Pulses: Normal pulses.  ?Pulmonary:  ?   Effort: Pulmonary effort is normal.  ?Abdominal:  ?  General: Abdomen is flat. There is no distension.  ?Musculoskeletal:     ?   General: Tenderness present.  ?   Cervical back: Normal range of motion.  ?   Comments: Pt is slow to rise from seated position to standing without difficulty. Good lumbar range of motion. Strong distal strength without clonus, no pain upon palpation of greater trochanters. Sensation intact bilaterally. Ambulates with walker, gait slow and unsteady.  ?Skin: ?   General: Skin is warm  and dry.  ?   Capillary Refill: Capillary refill takes less than 2 seconds.  ?Neurological:  ?   Mental Status: She is alert and oriented to person, place, and time.  ?   Gait: Gait abnormal.  ?  ?Ortho Exam ? ?Imagin

## 2022-01-18 ENCOUNTER — Ambulatory Visit (INDEPENDENT_AMBULATORY_CARE_PROVIDER_SITE_OTHER): Payer: Medicare Other | Admitting: Internal Medicine

## 2022-01-18 ENCOUNTER — Encounter: Payer: Self-pay | Admitting: Internal Medicine

## 2022-01-18 ENCOUNTER — Ambulatory Visit (INDEPENDENT_AMBULATORY_CARE_PROVIDER_SITE_OTHER): Payer: Medicare Other

## 2022-01-18 VITALS — BP 114/68 | HR 80 | Temp 97.6°F | Ht 61.0 in | Wt 124.0 lb

## 2022-01-18 DIAGNOSIS — J471 Bronchiectasis with (acute) exacerbation: Secondary | ICD-10-CM

## 2022-01-18 DIAGNOSIS — J479 Bronchiectasis, uncomplicated: Secondary | ICD-10-CM | POA: Diagnosis not present

## 2022-01-18 MED ORDER — TRELEGY ELLIPTA 200-62.5-25 MCG/ACT IN AEPB: 1.0000 | INHALATION_SPRAY | Freq: Every day | RESPIRATORY_TRACT | 5 refills | Status: DC

## 2022-01-18 MED ORDER — AMOXICILLIN-POT CLAVULANATE 875-125 MG PO TABS: 1.0000 | ORAL_TABLET | Freq: Two times a day (BID) | ORAL | 0 refills | Status: DC

## 2022-01-18 MED ORDER — SODIUM CHLORIDE 3 % IN NEBU: INHALATION_SOLUTION | RESPIRATORY_TRACT | 11 refills | Status: DC

## 2022-01-18 NOTE — Patient Instructions (Addendum)
Follow up with Dr. Silas Flood in 4-6 weeks. ? ?Start with trelegy once a day in the morning. ? ?Thre times a day for the next week: ? ?Take albuterol first. Then nebulized saline. Then finish with the flutter valve to cough up mucus.  ? ?Please bring me sputum samples in the  next couple of days. ? ?I am giving you antibiotics for a week. Try some yogurt or probiotics because this might make diarrhea worse.  ? ? ? ?
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
## 2022-01-18 NOTE — Addendum Note (Signed)
Addended by: Valerie Salts on: 01/18/2022 05:12 PM ? ? Modules accepted: Orders ? ?

## 2022-01-19 ENCOUNTER — Other Ambulatory Visit: Payer: Medicare Other

## 2022-01-19 ENCOUNTER — Telehealth: Payer: Self-pay | Admitting: Pulmonary Disease

## 2022-01-19 DIAGNOSIS — R279 Unspecified lack of coordination: Secondary | ICD-10-CM | POA: Diagnosis not present

## 2022-01-19 DIAGNOSIS — E46 Unspecified protein-calorie malnutrition: Secondary | ICD-10-CM | POA: Diagnosis not present

## 2022-01-19 DIAGNOSIS — B354 Tinea corporis: Secondary | ICD-10-CM | POA: Diagnosis not present

## 2022-01-19 DIAGNOSIS — K219 Gastro-esophageal reflux disease without esophagitis: Secondary | ICD-10-CM | POA: Diagnosis present

## 2022-01-19 DIAGNOSIS — M1711 Unilateral primary osteoarthritis, right knee: Secondary | ICD-10-CM | POA: Diagnosis present

## 2022-01-19 DIAGNOSIS — D539 Nutritional anemia, unspecified: Secondary | ICD-10-CM | POA: Diagnosis present

## 2022-01-19 DIAGNOSIS — Z7982 Long term (current) use of aspirin: Secondary | ICD-10-CM

## 2022-01-19 DIAGNOSIS — J309 Allergic rhinitis, unspecified: Secondary | ICD-10-CM | POA: Diagnosis not present

## 2022-01-19 DIAGNOSIS — G8929 Other chronic pain: Secondary | ICD-10-CM | POA: Diagnosis not present

## 2022-01-19 DIAGNOSIS — Z743 Need for continuous supervision: Secondary | ICD-10-CM | POA: Diagnosis not present

## 2022-01-19 DIAGNOSIS — Z90710 Acquired absence of both cervix and uterus: Secondary | ICD-10-CM

## 2022-01-19 DIAGNOSIS — Z20822 Contact with and (suspected) exposure to covid-19: Secondary | ICD-10-CM | POA: Diagnosis present

## 2022-01-19 DIAGNOSIS — Z803 Family history of malignant neoplasm of breast: Secondary | ICD-10-CM | POA: Diagnosis not present

## 2022-01-19 DIAGNOSIS — J45909 Unspecified asthma, uncomplicated: Secondary | ICD-10-CM | POA: Diagnosis not present

## 2022-01-19 DIAGNOSIS — J90 Pleural effusion, not elsewhere classified: Secondary | ICD-10-CM | POA: Diagnosis not present

## 2022-01-19 DIAGNOSIS — D649 Anemia, unspecified: Secondary | ICD-10-CM | POA: Diagnosis not present

## 2022-01-19 DIAGNOSIS — Z825 Family history of asthma and other chronic lower respiratory diseases: Secondary | ICD-10-CM

## 2022-01-19 DIAGNOSIS — R5381 Other malaise: Secondary | ICD-10-CM | POA: Diagnosis not present

## 2022-01-19 DIAGNOSIS — Z881 Allergy status to other antibiotic agents status: Secondary | ICD-10-CM

## 2022-01-19 DIAGNOSIS — Z96653 Presence of artificial knee joint, bilateral: Secondary | ICD-10-CM | POA: Diagnosis present

## 2022-01-19 DIAGNOSIS — Z853 Personal history of malignant neoplasm of breast: Secondary | ICD-10-CM

## 2022-01-19 DIAGNOSIS — M1990 Unspecified osteoarthritis, unspecified site: Secondary | ICD-10-CM | POA: Diagnosis not present

## 2022-01-19 DIAGNOSIS — Z9109 Other allergy status, other than to drugs and biological substances: Secondary | ICD-10-CM | POA: Diagnosis not present

## 2022-01-19 DIAGNOSIS — E876 Hypokalemia: Secondary | ICD-10-CM | POA: Diagnosis present

## 2022-01-19 DIAGNOSIS — Z8249 Family history of ischemic heart disease and other diseases of the circulatory system: Secondary | ICD-10-CM

## 2022-01-19 DIAGNOSIS — J9601 Acute respiratory failure with hypoxia: Secondary | ICD-10-CM | POA: Diagnosis present

## 2022-01-19 DIAGNOSIS — M545 Low back pain, unspecified: Secondary | ICD-10-CM | POA: Diagnosis not present

## 2022-01-19 DIAGNOSIS — J470 Bronchiectasis with acute lower respiratory infection: Secondary | ICD-10-CM | POA: Diagnosis not present

## 2022-01-19 DIAGNOSIS — T502X5A Adverse effect of carbonic-anhydrase inhibitors, benzothiadiazides and other diuretics, initial encounter: Secondary | ICD-10-CM | POA: Diagnosis present

## 2022-01-19 DIAGNOSIS — J471 Bronchiectasis with (acute) exacerbation: Secondary | ICD-10-CM | POA: Diagnosis not present

## 2022-01-19 DIAGNOSIS — Z85828 Personal history of other malignant neoplasm of skin: Secondary | ICD-10-CM | POA: Diagnosis not present

## 2022-01-19 DIAGNOSIS — A310 Pulmonary mycobacterial infection: Secondary | ICD-10-CM | POA: Diagnosis not present

## 2022-01-19 DIAGNOSIS — J45901 Unspecified asthma with (acute) exacerbation: Secondary | ICD-10-CM | POA: Diagnosis present

## 2022-01-19 DIAGNOSIS — Z79899 Other long term (current) drug therapy: Secondary | ICD-10-CM

## 2022-01-19 DIAGNOSIS — R0602 Shortness of breath: Secondary | ICD-10-CM | POA: Diagnosis present

## 2022-01-19 DIAGNOSIS — Z66 Do not resuscitate: Secondary | ICD-10-CM | POA: Diagnosis present

## 2022-01-19 DIAGNOSIS — Z8619 Personal history of other infectious and parasitic diseases: Secondary | ICD-10-CM | POA: Diagnosis not present

## 2022-01-19 DIAGNOSIS — F5104 Psychophysiologic insomnia: Secondary | ICD-10-CM | POA: Diagnosis not present

## 2022-01-19 DIAGNOSIS — M5416 Radiculopathy, lumbar region: Secondary | ICD-10-CM | POA: Diagnosis not present

## 2022-01-19 DIAGNOSIS — R0600 Dyspnea, unspecified: Secondary | ICD-10-CM | POA: Diagnosis not present

## 2022-01-19 DIAGNOSIS — I10 Essential (primary) hypertension: Secondary | ICD-10-CM | POA: Diagnosis present

## 2022-01-19 DIAGNOSIS — R2681 Unsteadiness on feet: Secondary | ICD-10-CM | POA: Diagnosis not present

## 2022-01-19 DIAGNOSIS — C4491 Basal cell carcinoma of skin, unspecified: Secondary | ICD-10-CM | POA: Diagnosis not present

## 2022-01-19 DIAGNOSIS — F419 Anxiety disorder, unspecified: Secondary | ICD-10-CM | POA: Diagnosis not present

## 2022-01-19 DIAGNOSIS — J690 Pneumonitis due to inhalation of food and vomit: Secondary | ICD-10-CM | POA: Diagnosis present

## 2022-01-19 DIAGNOSIS — R627 Adult failure to thrive: Secondary | ICD-10-CM | POA: Diagnosis not present

## 2022-01-19 DIAGNOSIS — H3530 Unspecified macular degeneration: Secondary | ICD-10-CM | POA: Diagnosis not present

## 2022-01-19 DIAGNOSIS — R918 Other nonspecific abnormal finding of lung field: Secondary | ICD-10-CM | POA: Diagnosis not present

## 2022-01-19 DIAGNOSIS — J189 Pneumonia, unspecified organism: Secondary | ICD-10-CM | POA: Diagnosis not present

## 2022-01-19 DIAGNOSIS — D75839 Thrombocytosis, unspecified: Secondary | ICD-10-CM | POA: Diagnosis present

## 2022-01-19 DIAGNOSIS — J181 Lobar pneumonia, unspecified organism: Secondary | ICD-10-CM | POA: Diagnosis not present

## 2022-01-19 DIAGNOSIS — Z7951 Long term (current) use of inhaled steroids: Secondary | ICD-10-CM

## 2022-01-19 DIAGNOSIS — J479 Bronchiectasis, uncomplicated: Secondary | ICD-10-CM | POA: Diagnosis not present

## 2022-01-20 ENCOUNTER — Other Ambulatory Visit: Payer: Self-pay

## 2022-01-20 ENCOUNTER — Inpatient Hospital Stay (HOSPITAL_COMMUNITY)
Admission: EM | Admit: 2022-01-20 | Discharge: 2022-01-26 | DRG: 177 | Disposition: A | Discharge status: Home Health | Payer: Medicare Other | Attending: Internal Medicine | Admitting: Internal Medicine

## 2022-01-20 ENCOUNTER — Telehealth: Payer: Self-pay | Admitting: Physical Medicine and Rehabilitation

## 2022-01-20 ENCOUNTER — Emergency Department (HOSPITAL_COMMUNITY): Payer: Medicare Other

## 2022-01-20 ENCOUNTER — Encounter (HOSPITAL_COMMUNITY): Payer: Self-pay

## 2022-01-20 DIAGNOSIS — Z8619 Personal history of other infectious and parasitic diseases: Secondary | ICD-10-CM | POA: Diagnosis not present

## 2022-01-20 DIAGNOSIS — E46 Unspecified protein-calorie malnutrition: Secondary | ICD-10-CM | POA: Diagnosis not present

## 2022-01-20 DIAGNOSIS — J45901 Unspecified asthma with (acute) exacerbation: Secondary | ICD-10-CM | POA: Diagnosis present

## 2022-01-20 DIAGNOSIS — B354 Tinea corporis: Secondary | ICD-10-CM | POA: Diagnosis not present

## 2022-01-20 DIAGNOSIS — M1990 Unspecified osteoarthritis, unspecified site: Secondary | ICD-10-CM | POA: Diagnosis not present

## 2022-01-20 DIAGNOSIS — J9601 Acute respiratory failure with hypoxia: Secondary | ICD-10-CM | POA: Diagnosis present

## 2022-01-20 DIAGNOSIS — J181 Lobar pneumonia, unspecified organism: Secondary | ICD-10-CM | POA: Diagnosis not present

## 2022-01-20 DIAGNOSIS — Z66 Do not resuscitate: Secondary | ICD-10-CM | POA: Diagnosis present

## 2022-01-20 DIAGNOSIS — M5416 Radiculopathy, lumbar region: Secondary | ICD-10-CM | POA: Diagnosis not present

## 2022-01-20 DIAGNOSIS — Z8249 Family history of ischemic heart disease and other diseases of the circulatory system: Secondary | ICD-10-CM | POA: Diagnosis not present

## 2022-01-20 DIAGNOSIS — J189 Pneumonia, unspecified organism: Secondary | ICD-10-CM

## 2022-01-20 DIAGNOSIS — J470 Bronchiectasis with acute lower respiratory infection: Secondary | ICD-10-CM | POA: Diagnosis present

## 2022-01-20 DIAGNOSIS — Z881 Allergy status to other antibiotic agents status: Secondary | ICD-10-CM | POA: Diagnosis not present

## 2022-01-20 DIAGNOSIS — Z743 Need for continuous supervision: Secondary | ICD-10-CM | POA: Diagnosis not present

## 2022-01-20 DIAGNOSIS — E876 Hypokalemia: Secondary | ICD-10-CM | POA: Diagnosis present

## 2022-01-20 DIAGNOSIS — Z853 Personal history of malignant neoplasm of breast: Secondary | ICD-10-CM | POA: Diagnosis not present

## 2022-01-20 DIAGNOSIS — T502X5A Adverse effect of carbonic-anhydrase inhibitors, benzothiadiazides and other diuretics, initial encounter: Secondary | ICD-10-CM | POA: Diagnosis present

## 2022-01-20 DIAGNOSIS — Z9109 Other allergy status, other than to drugs and biological substances: Secondary | ICD-10-CM | POA: Diagnosis not present

## 2022-01-20 DIAGNOSIS — Z90710 Acquired absence of both cervix and uterus: Secondary | ICD-10-CM | POA: Diagnosis not present

## 2022-01-20 DIAGNOSIS — F5104 Psychophysiologic insomnia: Secondary | ICD-10-CM | POA: Diagnosis not present

## 2022-01-20 DIAGNOSIS — J690 Pneumonitis due to inhalation of food and vomit: Secondary | ICD-10-CM | POA: Diagnosis present

## 2022-01-20 DIAGNOSIS — F419 Anxiety disorder, unspecified: Secondary | ICD-10-CM | POA: Diagnosis not present

## 2022-01-20 DIAGNOSIS — D75839 Thrombocytosis, unspecified: Secondary | ICD-10-CM | POA: Diagnosis present

## 2022-01-20 DIAGNOSIS — G8929 Other chronic pain: Secondary | ICD-10-CM | POA: Diagnosis not present

## 2022-01-20 DIAGNOSIS — R0600 Dyspnea, unspecified: Secondary | ICD-10-CM | POA: Diagnosis not present

## 2022-01-20 DIAGNOSIS — M1711 Unilateral primary osteoarthritis, right knee: Secondary | ICD-10-CM | POA: Diagnosis present

## 2022-01-20 DIAGNOSIS — Z803 Family history of malignant neoplasm of breast: Secondary | ICD-10-CM | POA: Diagnosis not present

## 2022-01-20 DIAGNOSIS — R0602 Shortness of breath: Secondary | ICD-10-CM | POA: Diagnosis present

## 2022-01-20 DIAGNOSIS — Z85828 Personal history of other malignant neoplasm of skin: Secondary | ICD-10-CM | POA: Diagnosis not present

## 2022-01-20 DIAGNOSIS — J479 Bronchiectasis, uncomplicated: Secondary | ICD-10-CM | POA: Diagnosis not present

## 2022-01-20 DIAGNOSIS — K219 Gastro-esophageal reflux disease without esophagitis: Secondary | ICD-10-CM | POA: Diagnosis present

## 2022-01-20 DIAGNOSIS — D539 Nutritional anemia, unspecified: Secondary | ICD-10-CM | POA: Diagnosis present

## 2022-01-20 DIAGNOSIS — I10 Essential (primary) hypertension: Secondary | ICD-10-CM | POA: Diagnosis present

## 2022-01-20 DIAGNOSIS — C4491 Basal cell carcinoma of skin, unspecified: Secondary | ICD-10-CM | POA: Diagnosis not present

## 2022-01-20 DIAGNOSIS — J45909 Unspecified asthma, uncomplicated: Secondary | ICD-10-CM

## 2022-01-20 DIAGNOSIS — Z825 Family history of asthma and other chronic lower respiratory diseases: Secondary | ICD-10-CM | POA: Diagnosis not present

## 2022-01-20 DIAGNOSIS — R2681 Unsteadiness on feet: Secondary | ICD-10-CM | POA: Diagnosis not present

## 2022-01-20 DIAGNOSIS — R918 Other nonspecific abnormal finding of lung field: Secondary | ICD-10-CM | POA: Diagnosis not present

## 2022-01-20 DIAGNOSIS — D649 Anemia, unspecified: Secondary | ICD-10-CM | POA: Diagnosis not present

## 2022-01-20 DIAGNOSIS — R279 Unspecified lack of coordination: Secondary | ICD-10-CM | POA: Diagnosis not present

## 2022-01-20 DIAGNOSIS — H3530 Unspecified macular degeneration: Secondary | ICD-10-CM | POA: Diagnosis not present

## 2022-01-20 DIAGNOSIS — A310 Pulmonary mycobacterial infection: Secondary | ICD-10-CM | POA: Diagnosis not present

## 2022-01-20 DIAGNOSIS — J90 Pleural effusion, not elsewhere classified: Secondary | ICD-10-CM | POA: Diagnosis not present

## 2022-01-20 DIAGNOSIS — Z20822 Contact with and (suspected) exposure to covid-19: Secondary | ICD-10-CM | POA: Diagnosis present

## 2022-01-20 DIAGNOSIS — J309 Allergic rhinitis, unspecified: Secondary | ICD-10-CM | POA: Diagnosis not present

## 2022-01-20 DIAGNOSIS — M545 Low back pain, unspecified: Secondary | ICD-10-CM | POA: Diagnosis not present

## 2022-01-20 DIAGNOSIS — R627 Adult failure to thrive: Secondary | ICD-10-CM | POA: Diagnosis not present

## 2022-01-20 DIAGNOSIS — Z96653 Presence of artificial knee joint, bilateral: Secondary | ICD-10-CM | POA: Diagnosis present

## 2022-01-20 DIAGNOSIS — R5381 Other malaise: Secondary | ICD-10-CM | POA: Diagnosis not present

## 2022-01-20 LAB — BASIC METABOLIC PANEL
Anion gap: 9 (ref 5–15)
BUN: 21 mg/dL (ref 8–23)
CO2: 27 mmol/L (ref 22–32)
Calcium: 9 mg/dL (ref 8.9–10.3)
Chloride: 105 mmol/L (ref 98–111)
Creatinine, Ser: 0.58 mg/dL (ref 0.44–1.00)
GFR, Estimated: >60 mL/min (ref >60)
Glucose, Bld: 120 mg/dL — ABNORMAL HIGH (ref 70–99)
Potassium: 4.2 mmol/L (ref 3.5–5.1)
Sodium: 141 mmol/L (ref 135–145)

## 2022-01-20 LAB — CBC
HCT: 35.1 % — ABNORMAL LOW (ref 36.0–46.0)
HCT: 35.5 % — ABNORMAL LOW (ref 36.0–46.0)
Hemoglobin: 11.4 g/dL — ABNORMAL LOW (ref 12.0–15.0)
Hemoglobin: 11.4 g/dL — ABNORMAL LOW (ref 12.0–15.0)
MCH: 35.4 pg — ABNORMAL HIGH (ref 26.0–34.0)
MCH: 35.2 pg — ABNORMAL HIGH (ref 26.0–34.0)
MCHC: 32.5 g/dL (ref 30.0–36.0)
MCHC: 32.1 g/dL (ref 30.0–36.0)
MCV: 109 fL — ABNORMAL HIGH (ref 80.0–100.0)
MCV: 109.6 fL — ABNORMAL HIGH (ref 80.0–100.0)
Platelets: 490 10*3/uL — ABNORMAL HIGH (ref 150–400)
Platelets: 473 10*3/uL — ABNORMAL HIGH (ref 150–400)
RBC: 3.22 MIL/uL — ABNORMAL LOW (ref 3.87–5.11)
RBC: 3.24 MIL/uL — ABNORMAL LOW (ref 3.87–5.11)
RDW: 13.9 % (ref 11.5–15.5)
RDW: 14 % (ref 11.5–15.5)
WBC: 21.5 10*3/uL — ABNORMAL HIGH (ref 4.0–10.5)
WBC: 17.7 10*3/uL — ABNORMAL HIGH (ref 4.0–10.5)
nRBC: 0 % (ref 0.0–0.2)
nRBC: 0 % (ref 0.0–0.2)

## 2022-01-20 LAB — COMPREHENSIVE METABOLIC PANEL
ALT: 11 U/L (ref 0–44)
AST: 13 U/L — ABNORMAL LOW (ref 15–41)
Albumin: 3.2 g/dL — ABNORMAL LOW (ref 3.5–5.0)
Alkaline Phosphatase: 84 U/L (ref 38–126)
Anion gap: 10 (ref 5–15)
BUN: 23 mg/dL (ref 8–23)
CO2: 24 mmol/L (ref 22–32)
Calcium: 8.7 mg/dL — ABNORMAL LOW (ref 8.9–10.3)
Chloride: 104 mmol/L (ref 98–111)
Creatinine, Ser: 0.56 mg/dL (ref 0.44–1.00)
GFR, Estimated: >60 mL/min (ref >60)
Glucose, Bld: 127 mg/dL — ABNORMAL HIGH (ref 70–99)
Potassium: 3.1 mmol/L — ABNORMAL LOW (ref 3.5–5.1)
Sodium: 138 mmol/L (ref 135–145)
Total Bilirubin: 0.7 mg/dL (ref 0.3–1.2)
Total Protein: 6.5 g/dL (ref 6.5–8.1)

## 2022-01-20 LAB — MAGNESIUM: Magnesium: 1.7 mg/dL (ref 1.7–2.4)

## 2022-01-20 LAB — URINALYSIS, ROUTINE W REFLEX MICROSCOPIC
Bacteria, UA: NONE SEEN
Bilirubin Urine: NEGATIVE
Glucose, UA: NEGATIVE mg/dL
Hgb urine dipstick: NEGATIVE
Ketones, ur: 5 mg/dL — AB
Nitrite: POSITIVE — AB
Protein, ur: NEGATIVE mg/dL
Specific Gravity, Urine: 1.019 (ref 1.005–1.030)
WBC, UA: >50 WBC/hpf — ABNORMAL HIGH (ref 0–5)
pH: 5 (ref 5.0–8.0)

## 2022-01-20 LAB — BRAIN NATRIURETIC PEPTIDE: B Natriuretic Peptide: 116.4 pg/mL — ABNORMAL HIGH (ref 0.0–100.0)

## 2022-01-20 LAB — RESP PANEL BY RT-PCR (FLU A&B, COVID) ARPGX2
Influenza A by PCR: NEGATIVE
Influenza B by PCR: NEGATIVE
SARS Coronavirus 2 by RT PCR: NEGATIVE

## 2022-01-20 LAB — RESPIRATORY CULTURE OR RESPIRATORY AND SPUTUM CULTURE: MICRO NUMBER:: 13315057

## 2022-01-20 LAB — LACTIC ACID, PLASMA: Lactic Acid, Venous: 1.1 mmol/L (ref 0.5–1.9)

## 2022-01-20 LAB — TROPONIN I (HIGH SENSITIVITY): Troponin I (High Sensitivity): 18 ng/L — ABNORMAL HIGH (ref <18)

## 2022-01-20 LAB — EXPECTORATED SPUTUM ASSESSMENT W GRAM STAIN, RFLX TO RESP C

## 2022-01-20 LAB — STREP PNEUMONIAE URINARY ANTIGEN: Strep Pneumo Urinary Antigen: NEGATIVE

## 2022-01-20 LAB — VITAMIN B12: Vitamin B-12: 4297 pg/mL — ABNORMAL HIGH (ref 180–914)

## 2022-01-20 LAB — FOLATE: Folate: 8.7 ng/mL (ref >5.9)

## 2022-01-20 LAB — MRSA NEXT GEN BY PCR, NASAL: MRSA by PCR Next Gen: NOT DETECTED

## 2022-01-20 MED ORDER — ENOXAPARIN SODIUM 40 MG/0.4ML IJ SOSY
40.0000 mg | PREFILLED_SYRINGE | INTRAMUSCULAR | Status: DC
  Administered 2022-01-20 – 2022-01-26 (×7): 40 mg via SUBCUTANEOUS
  Filled 2022-01-20 (×7): qty 0.4

## 2022-01-20 MED ORDER — GUAIFENESIN ER 600 MG PO TB12
600.0000 mg | ORAL_TABLET | Freq: Two times a day (BID) | ORAL | Status: DC
  Administered 2022-01-20 – 2022-01-26 (×14): 600 mg via ORAL
  Filled 2022-01-20 (×14): qty 1

## 2022-01-20 MED ORDER — HYDROCHLOROTHIAZIDE 12.5 MG PO TABS
12.5000 mg | ORAL_TABLET | Freq: Every day | ORAL | Status: DC
  Administered 2022-01-20 – 2022-01-24 (×5): 12.5 mg via ORAL
  Filled 2022-01-20 (×5): qty 1

## 2022-01-20 MED ORDER — LOSARTAN POTASSIUM 50 MG PO TABS
50.0000 mg | ORAL_TABLET | Freq: Every day | ORAL | Status: DC
  Administered 2022-01-20 – 2022-01-26 (×7): 50 mg via ORAL
  Filled 2022-01-20 (×7): qty 1

## 2022-01-20 MED ORDER — ACETAMINOPHEN 650 MG RE SUPP
650.0000 mg | Freq: Four times a day (QID) | RECTAL | Status: DC | PRN
Start: 2022-01-20 — End: 2022-01-26

## 2022-01-20 MED ORDER — ALBUTEROL SULFATE (2.5 MG/3ML) 0.083% IN NEBU: 2.5000 mg | INHALATION_SOLUTION | Freq: Four times a day (QID) | RESPIRATORY_TRACT | Status: DC | PRN

## 2022-01-20 MED ORDER — DIPHENHYDRAMINE HCL 25 MG PO CAPS
25.0000 mg | ORAL_CAPSULE | Freq: Once | ORAL | Status: AC | PRN
  Administered 2022-01-20: 25 mg via ORAL
  Filled 2022-01-20: qty 1

## 2022-01-20 MED ORDER — UMECLIDINIUM BROMIDE 62.5 MCG/ACT IN AEPB
1.0000 | INHALATION_SPRAY | Freq: Every day | RESPIRATORY_TRACT | Status: DC
  Filled 2022-01-20: qty 7

## 2022-01-20 MED ORDER — DOXYCYCLINE HYCLATE 100 MG PO TABS
100.0000 mg | ORAL_TABLET | Freq: Two times a day (BID) | ORAL | Status: DC
Start: 2022-01-20 — End: 2022-01-20

## 2022-01-20 MED ORDER — ACETAMINOPHEN 325 MG PO TABS: 650.0000 mg | ORAL_TABLET | Freq: Four times a day (QID) | ORAL | Status: DC | PRN

## 2022-01-20 MED ORDER — LOSARTAN POTASSIUM-HCTZ 50-12.5 MG PO TABS: 1.0000 | ORAL_TABLET | Freq: Every day | ORAL | Status: DC

## 2022-01-20 MED ORDER — FLUTICASONE-UMECLIDIN-VILANT 200-62.5-25 MCG/ACT IN AEPB
1.0000 | INHALATION_SPRAY | Freq: Every day | RESPIRATORY_TRACT | Status: DC
  Administered 2022-01-20 – 2022-01-26 (×7): 1 via RESPIRATORY_TRACT

## 2022-01-20 MED ORDER — METRONIDAZOLE 500 MG/100ML IV SOLN
500.0000 mg | Freq: Two times a day (BID) | INTRAVENOUS | Status: DC
  Administered 2022-01-20 – 2022-01-25 (×11): 500 mg via INTRAVENOUS
  Filled 2022-01-20 (×11): qty 100

## 2022-01-20 MED ORDER — SODIUM CHLORIDE 0.9 % IV SOLN
1.0000 g | INTRAVENOUS | Status: DC
  Administered 2022-01-21 – 2022-01-26 (×6): 1 g via INTRAVENOUS
  Filled 2022-01-20 (×6): qty 10

## 2022-01-20 MED ORDER — DOXYCYCLINE HYCLATE 100 MG PO TABS
100.0000 mg | ORAL_TABLET | Freq: Once | ORAL | Status: AC
  Administered 2022-01-20: 100 mg via ORAL
  Filled 2022-01-20: qty 1

## 2022-01-20 MED ORDER — SODIUM CHLORIDE 3 % IN NEBU
4.0000 mL | INHALATION_SOLUTION | Freq: Two times a day (BID) | RESPIRATORY_TRACT | Status: AC
  Administered 2022-01-20 – 2022-01-22 (×4): 4 mL via RESPIRATORY_TRACT
  Filled 2022-01-20 (×6): qty 4

## 2022-01-20 MED ORDER — POTASSIUM CHLORIDE 20 MEQ PO PACK
40.0000 meq | PACK | Freq: Two times a day (BID) | ORAL | Status: DC
  Administered 2022-01-20: 40 meq via ORAL
  Filled 2022-01-20: qty 2

## 2022-01-20 MED ORDER — SODIUM CHLORIDE 0.9 % IV SOLN
100.0000 mg | Freq: Two times a day (BID) | INTRAVENOUS | Status: DC
  Administered 2022-01-20 – 2022-01-26 (×12): 100 mg via INTRAVENOUS
  Filled 2022-01-20 (×13): qty 100

## 2022-01-20 MED ORDER — METHYLPREDNISOLONE SODIUM SUCC 125 MG IJ SOLR
60.0000 mg | Freq: Once | INTRAMUSCULAR | Status: AC
  Administered 2022-01-20: 60 mg via INTRAVENOUS
  Filled 2022-01-20: qty 2

## 2022-01-20 MED ORDER — FLUTICASONE FUROATE-VILANTEROL 200-25 MCG/ACT IN AEPB
1.0000 | INHALATION_SPRAY | Freq: Every day | RESPIRATORY_TRACT | Status: DC
  Filled 2022-01-20: qty 28

## 2022-01-20 MED ORDER — PREDNISONE 20 MG PO TABS: 40.0000 mg | ORAL_TABLET | Freq: Every day | ORAL | Status: DC

## 2022-01-20 MED ORDER — ASPIRIN EC 81 MG PO TBEC
81.0000 mg | DELAYED_RELEASE_TABLET | Freq: Every day | ORAL | Status: DC
  Administered 2022-01-20 – 2022-01-26 (×7): 81 mg via ORAL
  Filled 2022-01-20 (×7): qty 1

## 2022-01-20 MED ORDER — PANTOPRAZOLE SODIUM 40 MG PO TBEC
40.0000 mg | DELAYED_RELEASE_TABLET | Freq: Two times a day (BID) | ORAL | Status: DC
  Administered 2022-01-20 – 2022-01-26 (×14): 40 mg via ORAL
  Filled 2022-01-20 (×14): qty 1

## 2022-01-20 MED ORDER — SODIUM CHLORIDE 0.9 % IV SOLN
1.0000 g | Freq: Once | INTRAVENOUS | Status: AC
  Administered 2022-01-20: 1 g via INTRAVENOUS
  Filled 2022-01-20: qty 10

## 2022-01-20 NOTE — Telephone Encounter (Signed)
Patient's daughter Thayer Headings called advised her mother is in the hospital and will need to reschedule her appointment. The number to contact Thayer Headings is [phone] ?

## 2022-01-20 NOTE — Hospital Course (Addendum)
88 y.o. female with bronchiectasis, MAI infection not on therapy, asthma, history of breast cancer, basal cell skin cancer, hypertension, lumbar radiculopathy ,seen at pulmonology office on 01/18/2022 for worsening shortness of breath and cough,was started on Augmentin due to concern for acute exacerbation of bronchiectasis subsequently seen in the ED with fever shortness of breath cough vomiting and diarrhea. ?Labs- wbc 21.5, hemoglobin 11.4 (stable), platelet count 490k.  Potassium 3.1.  Lactic acid normal.  COVID and influenza PCR negative.  High-sensitivity troponin 18.  EKG without acute ischemic changes.  Blood cultures drawn. Cxr- progressive bibasilar pulmonary infiltrate (R >L) concerning for infection or aspiration. Patient is admitted for further management ?

## 2022-01-20 NOTE — Progress Notes (Signed)
Patient seen and examined personally, I reviewed the chart, history and physical and admission note, done by admitting physician this morning and agree with the same with following addendum.  Please refer to the morning admission note for more detailed plan of care. ? ?Briefly,  ? 88 y.o. female with bronchiectasis, MAI infection not on therapy, asthma, history of breast cancer, basal cell skin cancer, hypertension, lumbar radiculopathy ,seen at pulmonology office on 01/18/2022 for worsening shortness of breath and cough,was started on Augmentin due to concern for acute exacerbation of bronchiectasis subsequently seen in the ED with fever shortness of breath cough vomiting and diarrhea. ?Labs- wbc 21.5, hemoglobin 11.4 (stable), platelet count 490k.  Potassium 3.1.  Lactic acid normal.  COVID and influenza PCR negative.  High-sensitivity troponin 18.  EKG without acute ischemic changes.  Blood cultures drawn. Cxr- progressive bibasilar pulmonary infiltrate (R >L) concerning for infection or aspiration. Patient is admitted for further management  ? ?She is resting comfortably on room air on the bedside chair.  She feels well. ?WC count improving ? ?Issues being addressed: ? ?Bilateral lower lobe pneumonia possible aspiration ?Acute exacerbation of asthma ?Bronchiectasis: ?Continue empiric antibiotics, bronchodilators Mucinex flutter valve, follow-up sputum culture urine antigen and empiric antibiotics-.  Followed by Dr. Silas Flood and will alert pulmonary if does not improve quickly ? ?Hypertension-stable on p.o. ?Mild hypokalemia-replete ? ?GERD-continue PPI ?Nausea vomiting and diarrhea-stable.  Supportive care diet as tolerated ? ?Macrocytic anemia ?Thrombocytosis: ?Monitor CBC ?
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
## 2022-01-20 NOTE — Plan of Care (Signed)

## 2022-01-20 NOTE — Telephone Encounter (Signed)
Called patient to give her an update and was informed that she is currently in the hospital for PNA. Will update Dr Silas Flood of the new progress. Nothing further needed  ?

## 2022-01-20 NOTE — ED Provider Notes (Signed)
?Carla [name] ?Provider Note ? ? ?CSN: [id number] ?Arrival date & time: 01/19/22  2352 ? ?  ? ?History ? ?Chief Complaint  ?Patient presents with  ? Chills  ? Emesis  ? ? ?Carla ALMGREN is a 88 y.o. female. ? ?Patient c/o increased non prod cough, fever 102, and recent pcp visit when was told suspected pna. Was started on augmentin today. Today did have episode of nausea, vomiting, diarrhea, persistent cough and sob. Symptoms acute onset in past few days, moderate, persistent, felt worse tonight. No chest pain. No abd pain. No dysuria. No extremity pain or swelling. No other specific known ill contacts. No bloody or bilious emesis or diarrhea.  ? ?The history is provided by the patient, medical records and a relative.  ?Emesis ?Associated symptoms: cough, diarrhea and fever   ?Associated symptoms: no abdominal pain, no headaches and no sore throat   ? ?  ? ?Home Medications ?Prior to Admission medications   ?Medication Sig Start Date End Date Taking? Authorizing Provider  ?albuterol (PROVENTIL HFA;VENTOLIN HFA) 108 (90 BASE) MCG/ACT inhaler Inhale 2 puffs into the lungs every 6 (six) hours as needed for wheezing or shortness of breath. 08/28/15   Juanito Doom, MD  ?albuterol (PROVENTIL) (2.5 MG/3ML) 0.083% nebulizer solution Take 82ms every 6 hours and PRN 11/30/20   Hunsucker, MBonna Gains MD  ?amoxicillin-clavulanate (AUGMENTIN) 875-125 MG tablet Take 1 tablet by mouth 2 (two) times daily. 01/18/22   DSpero Geralds MD  ?aspirin EC 81 MG tablet Take 81 mg by mouth daily.    [provider]  ?Biotin 10 MG TABS Take 10 mg by mouth daily.    [provider]  ?Dextromethorphan-guaiFENesin (MUCINEX DM) 30-600 MG TB12 TAKE 1 TABLET TWICE DAILY 07/20/21   Hunsucker, MBonna Gains MD  ?diazepam (VALIUM) 5 MG tablet Take 1 tablet (5 mg total) by mouth every 6 (six) hours as needed for anxiety. 12/11/14   Perkins, Alexzandrew L, PA-C  ?diclofenac sodium (VOLTAREN) 1 % GEL  Apply 2 g topically as needed (pain).     [provider]  ?diphenhydramine-acetaminophen (TYLENOL PM) 25-500 MG TABS tablet Take 1 tablet by mouth at bedtime.    [provider]  ?fluticasone (FLONASE) 50 MCG/ACT nasal spray Place 1 spray into both nostrils daily as needed for allergies. 06/18/19   OLaurin Coder MD  ?Fluticasone-Umeclidin-Vilant (TRELEGY ELLIPTA) 200-62.5-25 MCG/ACT AEPB Inhale 1 puff into the lungs daily. 01/18/22   Hunsucker, MBonna Gains MD  ?Fluticasone-Umeclidin-Vilant (TRELEGY ELLIPTA) 200-62.5-25 MCG/INH AEPB Inhale 1 puff into the lungs daily. 06/07/21   Hunsucker, MBonna Gains MD  ?lidocaine (LIDODERM) 5 % Place 1 patch onto the skin as needed. Remove & Discard patch within 12 hours or as directed by MD    [provider]  ?losartan-hydrochlorothiazide (HYZAAR) 50-12.5 MG per tablet Take 1 tablet by mouth daily.     [provider]  ?Multiple Vitamins-Minerals (OCUVITE PRESERVISION PO) Take 1 tablet by mouth 2 (two) times daily.    [provider]  ?omeprazole (PRILOSEC) 40 MG capsule Take 40 mg by mouth 2 (two) times daily.    [provider]  ?Respiratory Therapy Supplies (NEBULIZER/TUBING/MOUTHPIECE) KIT 4 each by Does not apply route every 30 (thirty) days. 06/07/21   Hunsucker, MBonna Gains MD  ?sodium chloride HYPERTONIC (NEBUSAL) 3 % nebulizer solution USE 1  IN NEBULIZER IN THE MORNING AND AT BEDTIME 01/18/22   Hunsucker, MBonna Gains MD  ?   ? ?Allergies    ?  Azithromycin and Tape   ? ?Review of Systems   ?Review of Systems  ?Constitutional:  Positive for fever.  ?HENT:  Negative for sore throat.   ?Eyes:  Negative for redness.  ?Respiratory:  Positive for cough and shortness of breath.   ?Cardiovascular:  Negative for chest pain and leg swelling.  ?Gastrointestinal:  Positive for diarrhea and vomiting. Negative for abdominal pain.  ?Genitourinary:  Negative for dysuria and flank pain.  ?Musculoskeletal:  Negative for back pain and  neck pain.  ?Skin:  Negative for rash.  ?Neurological:  Negative for headaches.  ?Hematological:  Does not bruise/bleed easily.  ?Psychiatric/Behavioral:  Negative for confusion.   ? ?Physical Exam ?Updated Vital Signs ?BP 136/69 (BP Location: Left Arm)   Pulse 69   Temp 98 ?F (36.7 ?C) (Oral)   Resp 18   SpO2 92%  ?Physical Exam ?Vitals and nursing note reviewed.  ?Constitutional:   ?   Appearance: Normal appearance. She is well-developed.  ?HENT:  ?   Head: Atraumatic.  ?   Nose: Nose normal.  ?   Mouth/Throat:  ?   Mouth: Mucous membranes are moist.  ?   Pharynx: Oropharynx is clear.  ?Eyes:  ?   General: No scleral icterus. ?   Conjunctiva/sclera: Conjunctivae normal.  ?   Pupils: Pupils are equal, round, and reactive to light.  ?Neck:  ?   Trachea: No tracheal deviation.  ?   Comments: No stiffness or rigidity ?Cardiovascular:  ?   Rate and Rhythm: Normal rate and regular rhythm.  ?   Pulses: Normal pulses.  ?   Heart sounds: Normal heart sounds. No murmur heard. ?  No friction rub. No gallop.  ?Pulmonary:  ?   Effort: Pulmonary effort is normal. No respiratory distress.  ?   Breath sounds: Rhonchi present.  ?Abdominal:  ?   General: Bowel sounds are normal. There is no distension.  ?   Palpations: Abdomen is soft.  ?   Tenderness: There is no abdominal tenderness. There is no guarding.  ?Genitourinary: ?   Comments: No cva tenderness.  ?Musculoskeletal:     ?   General: No swelling or tenderness.  ?   Cervical back: Normal range of motion and neck supple. No rigidity. No muscular tenderness.  ?   Right lower leg: No edema.  ?   Left lower leg: No edema.  ?Skin: ?   General: Skin is warm and dry.  ?   Findings: No rash.  ?Neurological:  ?   Mental Status: She is alert.  ?   Comments: Alert, speech normal.   ?Psychiatric:     ?   Mood and Affect: Mood normal.  ? ? ?ED Results / Procedures / Treatments   ?Labs ?(all labs ordered are listed, but only abnormal results are displayed) ?Results for orders placed  or performed during the hospital encounter of 01/20/22  ?Resp Panel by RT-PCR (Flu A&B, Covid) Nasopharyngeal Swab  ? Specimen: Nasopharyngeal Swab; Nasopharyngeal(NP) swabs in vial transport medium  ?Result Value Ref Range  ? SARS Coronavirus 2 by RT PCR NEGATIVE NEGATIVE  ? Influenza A by PCR NEGATIVE NEGATIVE  ? Influenza B by PCR NEGATIVE NEGATIVE  ?CBC  ?Result Value Ref Range  ? WBC 21.5 (H) 4.0 - 10.5 K/uL  ? RBC 3.22 (L) 3.87 - 5.11 MIL/uL  ? Hemoglobin 11.4 (L) 12.0 - 15.0 g/dL  ? HCT 35.1 (L) 36.0 - 46.0 %  ? MCV 109.0 (H) 80.0 - 100.0  fL  ? MCH 35.4 (H) 26.0 - 34.0 pg  ? MCHC 32.5 30.0 - 36.0 g/dL  ? RDW 13.9 11.5 - 15.5 %  ? Platelets 490 (H) 150 - 400 K/uL  ? nRBC 0.0 0.0 - 0.2 %  ?Comprehensive metabolic panel  ?Result Value Ref Range  ? Sodium 138 135 - 145 mmol/L  ? Potassium 3.1 (L) 3.5 - 5.1 mmol/L  ? Chloride 104 98 - 111 mmol/L  ? CO2 24 22 - 32 mmol/L  ? Glucose, Bld 127 (H) 70 - 99 mg/dL  ? BUN 23 8 - 23 mg/dL  ? Creatinine, Ser 0.56 0.44 - 1.00 mg/dL  ? Calcium 8.7 (L) 8.9 - 10.3 mg/dL  ? Total Protein 6.5 6.5 - 8.1 g/dL  ? Albumin 3.2 (L) 3.5 - 5.0 g/dL  ? AST 13 (L) 15 - 41 U/L  ? ALT 11 0 - 44 U/L  ? Alkaline Phosphatase 84 38 - 126 U/L  ? Total Bilirubin 0.7 0.3 - 1.2 mg/dL  ? GFR, Estimated >60 >60 mL/min  ? Anion gap 10 5 - 15  ?Lactic acid, plasma  ?Result Value Ref Range  ? Lactic Acid, Venous 1.1 0.5 - 1.9 mmol/L  ?Troponin I (High Sensitivity)  ?Result Value Ref Range  ? Troponin I (High Sensitivity) 18 (H) <18 ng/L  ? ?DG Chest 2 View ? ?Result Date: 01/18/2022 ?CLINICAL DATA:  Bronchiectasis with acute exacerbation. EXAM: CHEST - 2 VIEW COMPARISON:  Radiograph 02/17/2021.  Chest CT 11/05/2014 FINDINGS: Patchy nodularity in the periphery of the right lung has progressed from prior radiograph. There is lesser nodularity in the periphery of the left mid lower lung zone left lung base. The heart is normal in size. Unchanged mediastinal contours, aortic atherosclerosis and tortuosity.  Chronic blunting of left costophrenic angle. No pneumothorax. No acute osseous findings. Surgical clips in the right breast and axilla. IMPRESSION: Patchy nodularity in the periphery of the right lung and to a less
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
## 2022-01-20 NOTE — ED Triage Notes (Addendum)
Pt reports with chills and vomiting since today. Pt reports being recently diagnosed with PNA.  ?

## 2022-01-21 DIAGNOSIS — J189 Pneumonia, unspecified organism: Secondary | ICD-10-CM | POA: Diagnosis not present

## 2022-01-21 LAB — BASIC METABOLIC PANEL
Anion gap: 9 (ref 5–15)
BUN: 31 mg/dL — ABNORMAL HIGH (ref 8–23)
CO2: 23 mmol/L (ref 22–32)
Calcium: 8.8 mg/dL — ABNORMAL LOW (ref 8.9–10.3)
Chloride: 107 mmol/L (ref 98–111)
Creatinine, Ser: 0.64 mg/dL (ref 0.44–1.00)
GFR, Estimated: >60 mL/min (ref >60)
Glucose, Bld: 98 mg/dL (ref 70–99)
Potassium: 3.8 mmol/L (ref 3.5–5.1)
Sodium: 139 mmol/L (ref 135–145)

## 2022-01-21 LAB — CBC
HCT: 31.6 % — ABNORMAL LOW (ref 36.0–46.0)
Hemoglobin: 10.2 g/dL — ABNORMAL LOW (ref 12.0–15.0)
MCH: 35.7 pg — ABNORMAL HIGH (ref 26.0–34.0)
MCHC: 32.3 g/dL (ref 30.0–36.0)
MCV: 110.5 fL — ABNORMAL HIGH (ref 80.0–100.0)
Platelets: 427 10*3/uL — ABNORMAL HIGH (ref 150–400)
RBC: 2.86 MIL/uL — ABNORMAL LOW (ref 3.87–5.11)
RDW: 13.7 % (ref 11.5–15.5)
WBC: 11.7 10*3/uL — ABNORMAL HIGH (ref 4.0–10.5)
nRBC: 0 % (ref 0.0–0.2)

## 2022-01-21 LAB — LEGIONELLA PNEUMOPHILA SEROGP 1 UR AG: L. pneumophila Serogp 1 Ur Ag: NEGATIVE

## 2022-01-21 MED ORDER — DIAZEPAM 5 MG PO TABS
5.0000 mg | ORAL_TABLET | Freq: Every evening | ORAL | Status: DC | PRN
  Administered 2022-01-21 – 2022-01-25 (×5): 5 mg via ORAL
  Filled 2022-01-21 (×5): qty 1

## 2022-01-21 MED ORDER — DIPHENHYDRAMINE HCL 25 MG PO CAPS: 25.0000 mg | ORAL_CAPSULE | Freq: Every evening | ORAL | Status: DC | PRN

## 2022-01-21 MED ORDER — ACETAMINOPHEN 500 MG PO TABS
500.0000 mg | ORAL_TABLET | Freq: Every evening | ORAL | Status: DC | PRN
  Administered 2022-01-21: 500 mg via ORAL
  Filled 2022-01-21: qty 1

## 2022-01-21 MED ORDER — ALBUTEROL SULFATE (2.5 MG/3ML) 0.083% IN NEBU: 2.5000 mg | INHALATION_SOLUTION | Freq: Four times a day (QID) | RESPIRATORY_TRACT | Status: DC | PRN

## 2022-01-21 MED ORDER — DIPHENHYDRAMINE-APAP (SLEEP) 25-500 MG PO TABS: 1.0000 | ORAL_TABLET | Freq: Every day | ORAL | Status: DC

## 2022-01-21 MED ORDER — BIOTIN 10 MG PO TABS: 10.0000 mg | ORAL_TABLET | Freq: Every day | ORAL | Status: DC

## 2022-01-21 NOTE — Progress Notes (Signed)
In ?PROGRESS NOTE ?Carla [name]  LZJ:[id number] DOB: April 07, 1926 DOA: 01/20/2022 ?PCP: [name], MD  ? ?Brief Narrative/Hospital Course: ? 88 y.o. female with bronchiectasis, MAI infection not on therapy, asthma, history of breast cancer, basal cell skin cancer, hypertension, lumbar radiculopathy ,seen at pulmonology office on 01/18/2022 for worsening shortness of breath and cough,was started on Augmentin due to concern for acute exacerbation of bronchiectasis subsequently seen in the ED with fever shortness of breath cough vomiting and diarrhea. ?Labs- wbc 21.5, hemoglobin 11.4 (stable), platelet count 490k.  Potassium 3.1.  Lactic acid normal.  COVID and influenza PCR negative.  High-sensitivity troponin 18.  EKG without acute ischemic changes.  Blood cultures drawn. Cxr- progressive bibasilar pulmonary infiltrate (R >L) concerning for infection or aspiration. Patient is admitted for further management  ?  ?Subjective: ?Seen and examined this morning.  Resting comfortably on the bedside chair, on room air she reports she feels somewhat better today still congested some pain on the right chest on the back. ?  ?Assessment and Plan: ?Principal Problem: ?  Pneumonia ?Active Problems: ?  HTN (hypertension) ?  Esophageal reflux ?  Asthma ?  Anemia ?  Thrombocytosis ?  ?Bilateral lower lobe pneumonia possible aspiration ?Acute exacerbation of asthma ?Bronchiectasis: ?At this time patient clinically improving, leukocytosis downtrending.  On room air, will continue with ceftriaxone doxycycline and Flagyl.  Blood culture no growth, sputum sent but not acceptable for testing.continue ICS/LAMA/LABA inhalers. She is followed by Dr. Silas Flood and will need close outpatient follow-up ?  ?Hypertension-stable on p.o. ?Mild hypokalemia-repleted ?  ?GERD-continue PPI ?Nausea vomiting and diarrhea-stable.  Supportive care diet as tolerated.  No complaint ?  ?Macrocytic anemia-hemoglobin is stable at 10.2 g ?Thrombocytosis: Likely  in the setting of infection downtrending ? ? ?DVT prophylaxis: enoxaparin (LOVENOX) injection 40 mg Start: 01/20/22 1000 ?Code Status:   Code Status: DNR ?Family Communication: plan of care discussed with patient at bedside. ?Patient status is: Inpatient level of care: Telemetry  ?Remains inpatient because: Ongoing IV antibiotics ?Patient currently not stable ? ?Dispo: The patient is from: Home, lives by herself, obtain PT OT. ?           Anticipated disposition: Back to home over the weekend ? ?Mobility Assessment (last 72 hours)   ? ? Mobility Assessment   ? ? [place] Name 01/20/22 2251 01/20/22 2156 01/20/22 0312  ?  ?  ? Does patient have an order for bedrest or is patient medically unstable No - Continue assessment No - Continue assessment No - Continue assessment    ? What is the highest level of mobility based on the progressive mobility assessment? Level 5 (Walks with assist in room/hall) - Balance while stepping forward/back and can walk in room with assist - Complete Level 5 (Walks with assist in room/hall) - Balance while stepping forward/back and can walk in room with assist - Complete Level 5 (Walks with assist in room/hall) - Balance while stepping forward/back and can walk in room with assist - Complete    ? ?  ?  ? ?  ?  ? ?Objective: ?Vitals last 24 hrs: ?Vitals:  ? 01/20/22 0721 01/20/22 1100 01/20/22 2200 01/21/22 0420  ?BP: 114/64 119/79 131/68 134/79  ?Pulse: 81 80 73 63  ?Resp: '20 18 18 20  '$ ?Temp: 98.1 ?F (36.7 ?C) 98 ?F (36.7 ?C) 97.8 ?F (36.6 ?C) 97.8 ?F (36.6 ?C)  ?TempSrc: Oral Oral Oral Oral  ?SpO2: 92% 93% 94% 93%  ? ?Weight change:  ? ?  Physical Examination: ?General exam: AA0X3,older than stated age, weak appearing. ?HEENT:Oral mucosa moist, Ear/Nose WNL grossly, dentition normal. ?Respiratory system: bilaterally air entry present with crackles in bilateral bases, no use of accessory muscle ?Cardiovascular system: S1 & S2 +, No JVD,. ?Gastrointestinal system: Abdomen soft,NT,ND, BS+ ?Nervous  System:Alert, awake, moving extremities and grossly nonfocal ?Extremities: LE edema none,distal peripheral pulses palpable.  ?Skin: No rashes,no icterus. ?MSK: Normal muscle bulk,tone, power ? ?Medications reviewed:  ?Scheduled Meds: ? aspirin EC  81 mg Oral Daily  ? enoxaparin (LOVENOX) injection  40 mg Subcutaneous Q24H  ? Fluticasone-Umeclidin-Vilant  1 puff Inhalation Daily  ? guaiFENesin  600 mg Oral BID  ? losartan  50 mg Oral Daily  ? And  ? hydrochlorothiazide  12.5 mg Oral Daily  ? pantoprazole  40 mg Oral BID  ? sodium chloride HYPERTONIC  4 mL Nebulization BID  ? ?Continuous Infusions: ? cefTRIAXone (ROCEPHIN)  IV 1 g (01/21/22 0217)  ? doxycycline (VIBRAMYCIN) IV 100 mg (01/21/22 0308)  ? metronidazole 500 mg (01/21/22 0524)  ? ? ?  ?Diet Order   ? ?       ?  Diet regular Room service appropriate? Yes; Fluid consistency: Thin  Diet effective now       ?  ? ?  ?  ? ?  ?  ? ?  ?  ?  ? ? ?Intake/Output Summary (Last 24 hours) at 01/21/2022 1241 ?Last data filed at 01/21/2022 0600 ?Gross per 24 hour  ?Intake 891.44 ml  ?Output --  ?Net 891.44 ml  ? ?Net IO Since Admission: 891.44 mL [01/21/22 1241]  ?Wt Readings from Last 3 Encounters:  ?01/18/22 56.2 kg  ?10/04/21 58.8 kg  ?06/07/21 59.3 kg  ?  ? ?Unresulted Labs (From admission, onward)  ? ?  Start     Ordered  ? 02-01-2022 0257  Legionella Pneumophila Serogp 1 Ur Ag  Once,   R       ? 2022-02-01 0257  ? ?  ?  ? ?  ?Data Reviewed: I have personally reviewed following labs and imaging studies ?CBC: ?Recent Labs  ?Lab 02/01/22 ?0026 02/01/2022 ?0336 01/21/22 ?6378  ?WBC 21.5* 17.7* 11.7*  ?HGB 11.4* 11.4* 10.2*  ?HCT 35.1* 35.5* 31.6*  ?MCV 109.0* 109.6* 110.5*  ?PLT 490* 473* 427*  ? ?Basic Metabolic Panel: ?Recent Labs  ?Lab 02/01/22 ?0026 Feb 01, 2022 ?0217 Feb 01, 2022 ?5885 01/21/22 ?0277  ?NA 138  --  141 139  ?K 3.1*  --  4.2 3.8  ?CL 104  --  105 107  ?CO2 24  --  27 23  ?GLUCOSE 127*  --  120* 98  ?BUN 23  --  21 31*  ?CREATININE 0.56  --  0.58 0.64  ?CALCIUM 8.7*   --  9.0 8.8*  ?MG  --  1.7  --   --   ? ?GFR: ?Estimated Creatinine Clearance: 33.2 mL/min (by C-G formula based on SCr of 0.64 mg/dL). ?Liver Function Tests: ?Recent Labs  ?Lab 2022/02/01 ?0026  ?AST 13*  ?ALT 11  ?ALKPHOS 84  ?BILITOT 0.7  ?PROT 6.5  ?ALBUMIN 3.2*  ? ?No results for input(s): LIPASE, AMYLASE in the last 168 hours. ?No results for input(s): AMMONIA in the last 168 hours. ?Coagulation Profile: ?No results for input(s): INR, PROTIME in the last 168 hours. ?BNP (last 3 results) ?No results for input(s): PROBNP in the last 8760 hours. ?HbA1C: ?No results for input(s): HGBA1C in the last 72 hours. ?CBG: ?No results for input(s):  GLUCAP in the last 168 hours. ?Lipid Profile: ?No results for input(s): CHOL, HDL, LDLCALC, TRIG, CHOLHDL, LDLDIRECT in the last 72 hours. ?Thyroid Function Tests: ?No results for input(s): TSH, T4TOTAL, FREET4, T3FREE, THYROIDAB in the last 72 hours. ?Sepsis Labs: ?Recent Labs  ?Lab 01/20/22 ?0026  ?LATICACIDVEN 1.1  ? ? ?Recent Results (from the past 240 hour(s))  ?Respiratory or Resp and Sputum Culture     Status: None  ? Collection Time: 01/19/22 12:02 PM  ? Specimen: Sputum  ?Result Value Ref Range Status  ? MICRO NUMBER: [id number]  Final  ? SPECIMEN QUALITY: Inadequate  Final  ? Source SPUTUM  Final  ? STATUS: FINAL  Final  ? GRAM STAIN:   Final  ?  The sputum specimen submitted contains 25 or more squamous epithelial cells per low power field and is unacceptable for culture due to oropharyngeal contamination. Please resubmit another specimen if clinically indicated.  ? RESULT: CANCELED    ?  Comment: Result canceled by the ancillary.  ?Resp Panel by RT-PCR (Flu A&B, Covid) Nasopharyngeal Swab     Status: None  ? Collection Time: 01/20/22 12:26 AM  ? Specimen: Nasopharyngeal Swab; Nasopharyngeal(NP) swabs in vial transport medium  ?Result Value Ref Range Status  ? SARS Coronavirus 2 by RT PCR NEGATIVE NEGATIVE Final  ?  Comment: (NOTE) ?SARS-CoV-2 target nucleic acids are NOT  DETECTED. ? ?The SARS-CoV-2 RNA is generally detectable in upper respiratory ?specimens during the acute phase of infection. The lowest ?concentration of SARS-CoV-2 viral copies this assay can dete

## 2022-01-22 DIAGNOSIS — R627 Adult failure to thrive: Secondary | ICD-10-CM | POA: Diagnosis not present

## 2022-01-22 DIAGNOSIS — J470 Bronchiectasis with acute lower respiratory infection: Secondary | ICD-10-CM | POA: Diagnosis not present

## 2022-01-22 LAB — EXPECTORATED SPUTUM ASSESSMENT W GRAM STAIN, RFLX TO RESP C

## 2022-01-22 MED ORDER — MAGNESIUM SULFATE 2 GM/50ML IV SOLN
2.0000 g | Freq: Once | INTRAVENOUS | Status: AC
  Administered 2022-01-22: 2 g via INTRAVENOUS
  Filled 2022-01-22: qty 50
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
## 2022-01-22 NOTE — Progress Notes (Signed)
In ?PROGRESS NOTE ?Carla [name]  LOV:[id number] DOB: 10/24/1925 DOA: 01/20/2022 ?PCP: [name], MD  ? ?Brief Narrative/Hospital Course: ? 88 y.o. female with bronchiectasis, MAI infection not on therapy, asthma, history of breast cancer, basal cell skin cancer, hypertension, lumbar radiculopathy ,seen at pulmonology office on 01/18/2022 for worsening shortness of breath and cough,was started on Augmentin due to concern for acute exacerbation of bronchiectasis subsequently seen in the ED with fever shortness of breath cough vomiting and diarrhea. ?Labs- wbc 21.5, hemoglobin 11.4 (stable), platelet count 490k.  Potassium 3.1.  Lactic acid normal.  COVID and influenza PCR negative.  High-sensitivity troponin 18.  EKG without acute ischemic changes.  Blood cultures drawn. Cxr- progressive bibasilar pulmonary infiltrate (R >L) concerning for infection or aspiration. Patient is admitted for further management  ?  ?Subjective: ? ?Seen and examined this morning.   ?Denies pain, reports was wheezing earlier, improved after neb treatment, still has intermittent cough ?No fever, ?Reports feeling weaker than normal ? ?  ?Assessment and Plan: ?Principal Problem: ?  Pneumonia ?Active Problems: ?  HTN (hypertension) ?  Esophageal reflux ?  Asthma ?  Anemia ?  Thrombocytosis ?  ?Bilateral lower lobe pneumonia possible aspiration ?Acute exacerbation of asthma ?Bronchiectasis: ?  On room air, Right lung sound coarse , rhonchus, Blood culture no growth, sputum sent but not acceptable for testing.  ?will continue with ceftriaxone doxycycline and Flagyl. continue ICS/LAMA/LABA inhalers.  ?If continue cough and right lung exam does not improve, will consider ct chest  ?She is followed by Dr. Silas Flood and will need close outpatient follow-up ?  ?Hypertension-stable on p.o. ?Mild hypokalemia/hypomagnesemia-replace ?  ?GERD-continue PPI ?Nausea vomiting and diarrhea-none in the hospital ?Lft wnl ?  ?Macrocytic anemia-hemoglobin is  stable at 10.2 g ?Thrombocytosis: Likely in the setting of infection downtrending ? ? ?DVT prophylaxis: enoxaparin (LOVENOX) injection 40 mg Start: 01/20/22 1000 ?Code Status:   Code Status: DNR ?Family Communication: plan of care discussed with patient at bedside. ?Patient status is: Inpatient level of care: Telemetry  ?Remains inpatient because: Ongoing IV antibiotics ? ? ?Dispo: The patient is from: Home, lives by herself, obtain PT OT. ?           Anticipated disposition: HH , may need ct chest if continue cough, lung exam does not improve ? ?Mobility Assessment (last 72 hours)   ? ? Mobility Assessment   ? ? [place] Name 01/22/22 1327 01/22/22 1015 01/21/22 2025 01/20/22 2251 01/20/22 2156  ? Does patient have an order for bedrest or is patient medically unstable -- No - Continue assessment No - Continue assessment No - Continue assessment No - Continue assessment  ? What is the highest level of mobility based on the progressive mobility assessment? Level 5 (Walks with assist in room/hall) - Balance while stepping forward/back and can walk in room with assist - Complete Level 5 (Walks with assist in room/hall) - Balance while stepping forward/back and can walk in room with assist - Complete Level 5 (Walks with assist in room/hall) - Balance while stepping forward/back and can walk in room with assist - Complete Level 5 (Walks with assist in room/hall) - Balance while stepping forward/back and can walk in room with assist - Complete Level 5 (Walks with assist in room/hall) - Balance while stepping forward/back and can walk in room with assist - Complete  ? ? [place] Name 01/20/22 8841  ?  ?  ?  ?  ? Does patient have an order for bedrest or  is patient medically unstable No - Continue assessment      ? What is the highest level of mobility based on the progressive mobility assessment? Level 5 (Walks with assist in room/hall) - Balance while stepping forward/back and can walk in room with assist - Complete      ? ?  ?   ? ?  ?  ? ?Objective: ?Vitals last 24 hrs: ?Vitals:  ? 01/22/22 0404 01/22/22 0937 01/22/22 1007 01/22/22 1130  ?BP: (!) 146/77  (!) 120/57   ?Pulse: 95  (!) 103   ?Resp: 18     ?Temp: 98.8 ?F (37.1 ?C)  97.8 ?F (36.6 ?C)   ?TempSrc: Oral  Oral   ?SpO2: 91% 92% 93% 96%  ?Weight:      ?Height:      ? ?Weight change:  ? ?Physical Examination: ?General exam: AA0X3,older than stated age, weak appearing. ?HEENT:Oral mucosa moist, Ear/Nose WNL grossly, dentition normal. ?Respiratory system: bilaterally air entry present with crackles in bilateral bases, worse on right, rhonchi on right ,no wheezing, no use of accessory muscle ?Cardiovascular system: S1 & S2 +, No JVD,. ?Gastrointestinal system: Abdomen soft,NT,ND, BS+ ?Nervous System:Alert, awake, moving extremities and grossly nonfocal ?Extremities: LE edema none,distal peripheral pulses palpable.  ?Skin: No rashes,no icterus. ?MSK: Normal muscle bulk,tone, power ? ?Medications reviewed:  ?Scheduled Meds: ? aspirin EC  81 mg Oral Daily  ? enoxaparin (LOVENOX) injection  40 mg Subcutaneous Q24H  ? Fluticasone-Umeclidin-Vilant  1 puff Inhalation Daily  ? guaiFENesin  600 mg Oral BID  ? losartan  50 mg Oral Daily  ? And  ? hydrochlorothiazide  12.5 mg Oral Daily  ? pantoprazole  40 mg Oral BID  ? sodium chloride HYPERTONIC  4 mL Nebulization BID  ? ?Continuous Infusions: ? cefTRIAXone (ROCEPHIN)  IV Stopped (01/22/22 0216)  ? doxycycline (VIBRAMYCIN) IV 100 mg (01/22/22 1405)  ? magnesium sulfate bolus IVPB    ? metronidazole Stopped (01/22/22 0612)  ? ? ?  ?Diet Order   ? ?       ?  Diet regular Room service appropriate? Yes; Fluid consistency: Thin  Diet effective now       ?  ? ?  ?  ? ?  ?  ? ?  ?  ?  ? ? ?Intake/Output Summary (Last 24 hours) at 01/22/2022 1609 ?Last data filed at 01/22/2022 1400 ?Gross per 24 hour  ?Intake 1006.28 ml  ?Output --  ?Net 1006.28 ml  ? ?Net IO Since Admission: 2,257.72 mL [01/22/22 1609]  ?Wt Readings from Last 3 Encounters:  ?01/21/22  56.2 kg  ?01/18/22 56.2 kg  ?10/04/21 58.8 kg  ?  ? ?Unresulted Labs (From admission, onward)  ? ?  Start     Ordered  ? 01/23/22 0500  CBC with Differential/Platelet  Tomorrow morning,   R       ?Question:  Specimen collection method  Answer:  Lab=Lab collect  ? 01/22/22 1602  ? 01/23/22 6644  Basic metabolic panel  Tomorrow morning,   R       ?Question:  Specimen collection method  Answer:  Lab=Lab collect  ? 01/22/22 1602  ? 01/23/22 0500  Magnesium  Tomorrow morning,   R       ?Question:  Specimen collection method  Answer:  Lab=Lab collect  ? 01/22/22 1602  ? ?  ?  ? ?  ?Data Reviewed: I have personally reviewed following labs and imaging studies ?CBC: ?Recent Labs  ?Lab 07-Feb-2022 ?0347  01/20/22 ?3154 01/21/22 ?0086  ?WBC 21.5* 17.7* 11.7*  ?HGB 11.4* 11.4* 10.2*  ?HCT 35.1* 35.5* 31.6*  ?MCV 109.0* 109.6* 110.5*  ?PLT 490* 473* 427*  ? ?Basic Metabolic Panel: ?Recent Labs  ?Lab 01/20/22 ?0026 01/20/22 ?0217 01/20/22 ?7619 01/21/22 ?5093  ?NA 138  --  141 139  ?K 3.1*  --  4.2 3.8  ?CL 104  --  105 107  ?CO2 24  --  27 23  ?GLUCOSE 127*  --  120* 98  ?BUN 23  --  21 31*  ?CREATININE 0.56  --  0.58 0.64  ?CALCIUM 8.7*  --  9.0 8.8*  ?MG  --  1.7  --   --   ? ?GFR: ?Estimated Creatinine Clearance: 33.2 mL/min (by C-G formula based on SCr of 0.64 mg/dL). ?Liver Function Tests: ?Recent Labs  ?Lab 01/20/22 ?0026  ?AST 13*  ?ALT 11  ?ALKPHOS 84  ?BILITOT 0.7  ?PROT 6.5  ?ALBUMIN 3.2*  ? ?No results for input(s): LIPASE, AMYLASE in the last 168 hours. ?No results for input(s): AMMONIA in the last 168 hours. ?Coagulation Profile: ?No results for input(s): INR, PROTIME in the last 168 hours. ?BNP (last 3 results) ?No results for input(s): PROBNP in the last 8760 hours. ?HbA1C: ?No results for input(s): HGBA1C in the last 72 hours. ?CBG: ?No results for input(s): GLUCAP in the last 168 hours. ?Lipid Profile: ?No results for input(s): CHOL, HDL, LDLCALC, TRIG, CHOLHDL, LDLDIRECT in the last 72 hours. ?Thyroid Function  Tests: ?No results for input(s): TSH, T4TOTAL, FREET4, T3FREE, THYROIDAB in the last 72 hours. ?Sepsis Labs: ?Recent Labs  ?Lab 01/20/22 ?0026  ?LATICACIDVEN 1.1  ? ? ?Recent Results (from the past 240 hour(s))  ?R

## 2022-01-22 NOTE — Plan of Care (Signed)
?  Problem: Health Behavior/Discharge Planning: ?Goal: Ability to manage health-related needs will improve ?Outcome: Progressing ?  ?Problem: Clinical Measurements: ?Goal: Ability to maintain clinical measurements within normal limits will improve ?Outcome: Progressing ?Goal: Will remain free from infection ?Outcome: Progressing ?Goal: Diagnostic test results will improve ?Outcome: Progressing ?Goal: Respiratory complications will improve ?Outcome: Progressing ?Goal: Cardiovascular complication will be avoided ?Outcome: Progressing ?  ?Problem: Activity: ?Goal: Risk for activity intolerance will decrease ?Outcome: Progressing ?  ?Problem: Coping: ?Goal: Level of anxiety will decrease ?Outcome: Progressing ?  ?Problem: Pain Managment: ?Goal: General experience of comfort will improve ?Outcome: Progressing ?  ?Problem: Safety: ?Goal: Ability to remain free from injury will improve ?Outcome: Progressing ?  ?Problem: Skin Integrity: ?Goal: Risk for impaired skin integrity will decrease ?Outcome: Progressing ?  ?Problem: Activity: ?Goal: Ability to tolerate increased activity will improve ?Outcome: Progressing ?  ?Problem: Clinical Measurements: ?Goal: Ability to maintain a body temperature in the normal range will improve ?Outcome: Progressing ?  ?Problem: Respiratory: ?Goal: Ability to maintain adequate ventilation will improve ?Outcome: Progressing ?Goal: Ability to maintain a clear airway will improve ?Outcome: Progressing ?  ?Problem: Education: ?Goal: Knowledge of General Education information will improve ?Description: Including pain rating scale, medication(s)/side effects and non-pharmacologic comfort measures ?Outcome: Completed/Met ?  ?Problem: Nutrition: ?Goal: Adequate nutrition will be maintained ?Outcome: Completed/Met ?  ?Problem: Elimination: ?Goal: Will not experience complications related to bowel motility ?Outcome: Completed/Met ?Goal: Will not experience complications related to urinary  retention ?Outcome: Completed/Met ?  ?

## 2022-01-23 ENCOUNTER — Inpatient Hospital Stay (HOSPITAL_COMMUNITY): Payer: Medicare Other

## 2022-01-23 DIAGNOSIS — R627 Adult failure to thrive: Secondary | ICD-10-CM | POA: Diagnosis not present

## 2022-01-23 DIAGNOSIS — J470 Bronchiectasis with acute lower respiratory infection: Secondary | ICD-10-CM | POA: Diagnosis not present

## 2022-01-23 LAB — CBC WITH DIFFERENTIAL/PLATELET
Abs Immature Granulocytes: 0.09 10*3/uL — ABNORMAL HIGH (ref 0.00–0.07)
Basophils Absolute: 0 10*3/uL (ref 0.0–0.1)
Basophils Relative: 0 %
Eosinophils Absolute: 0 10*3/uL (ref 0.0–0.5)
Eosinophils Relative: 0 %
HCT: 31 % — ABNORMAL LOW (ref 36.0–46.0)
Hemoglobin: 10.5 g/dL — ABNORMAL LOW (ref 12.0–15.0)
Immature Granulocytes: 1 %
Lymphocytes Relative: 15 %
Lymphs Abs: 1.8 10*3/uL (ref 0.7–4.0)
MCH: 36.1 pg — ABNORMAL HIGH (ref 26.0–34.0)
MCHC: 33.9 g/dL (ref 30.0–36.0)
MCV: 106.5 fL — ABNORMAL HIGH (ref 80.0–100.0)
Monocytes Absolute: 1.5 10*3/uL — ABNORMAL HIGH (ref 0.1–1.0)
Monocytes Relative: 13 %
Neutro Abs: 8.6 10*3/uL — ABNORMAL HIGH (ref 1.7–7.7)
Neutrophils Relative %: 71 %
Platelets: 436 10*3/uL — ABNORMAL HIGH (ref 150–400)
RBC: 2.91 MIL/uL — ABNORMAL LOW (ref 3.87–5.11)
RDW: 13.8 % (ref 11.5–15.5)
WBC: 12 10*3/uL — ABNORMAL HIGH (ref 4.0–10.5)
nRBC: 0 % (ref 0.0–0.2)

## 2022-01-23 LAB — BASIC METABOLIC PANEL
Anion gap: 8 (ref 5–15)
BUN: 12 mg/dL (ref 8–23)
CO2: 27 mmol/L (ref 22–32)
Calcium: 8.3 mg/dL — ABNORMAL LOW (ref 8.9–10.3)
Chloride: 103 mmol/L (ref 98–111)
Creatinine, Ser: 0.52 mg/dL (ref 0.44–1.00)
GFR, Estimated: >60 mL/min (ref >60)
Glucose, Bld: 101 mg/dL — ABNORMAL HIGH (ref 70–99)
Potassium: 2.9 mmol/L — ABNORMAL LOW (ref 3.5–5.1)
Sodium: 138 mmol/L (ref 135–145)

## 2022-01-23 LAB — CBC
HCT: 32.5 % — ABNORMAL LOW (ref 36.0–46.0)
Hemoglobin: 10.5 g/dL — ABNORMAL LOW (ref 12.0–15.0)
MCH: 35.1 pg — ABNORMAL HIGH (ref 26.0–34.0)
MCHC: 32.3 g/dL (ref 30.0–36.0)
MCV: 108.7 fL — ABNORMAL HIGH (ref 80.0–100.0)
Platelets: 426 10*3/uL — ABNORMAL HIGH (ref 150–400)
RBC: 2.99 MIL/uL — ABNORMAL LOW (ref 3.87–5.11)
RDW: 13.8 % (ref 11.5–15.5)
WBC: 11.8 10*3/uL — ABNORMAL HIGH (ref 4.0–10.5)
nRBC: 0 % (ref 0.0–0.2)

## 2022-01-23 LAB — MAGNESIUM: Magnesium: 2.1 mg/dL (ref 1.7–2.4)

## 2022-01-23 MED ORDER — POTASSIUM CHLORIDE CRYS ER 20 MEQ PO TBCR
40.0000 meq | EXTENDED_RELEASE_TABLET | ORAL | Status: AC
  Administered 2022-01-23 (×2): 40 meq via ORAL
  Filled 2022-01-23 (×2): qty 2

## 2022-01-23 NOTE — Plan of Care (Signed)
?  Problem: Health Behavior/Discharge Planning: ?Goal: Ability to manage health-related needs will improve ?Outcome: Progressing ?  ?Problem: Clinical Measurements: ?Goal: Ability to maintain clinical measurements within normal limits will improve ?Outcome: Progressing ?Goal: Will remain free from infection ?Outcome: Progressing ?Goal: Diagnostic test results will improve ?Outcome: Progressing ?Goal: Respiratory complications will improve ?Outcome: Progressing ?Goal: Cardiovascular complication will be avoided ?Outcome: Progressing ?  ?Problem: Activity: ?Goal: Risk for activity intolerance will decrease ?Outcome: Progressing ?  ?Problem: Coping: ?Goal: Level of anxiety will decrease ?Outcome: Progressing ?  ?Problem: Pain Managment: ?Goal: General experience of comfort will improve ?Outcome: Progressing ?  ?Problem: Safety: ?Goal: Ability to remain free from injury will improve ?Outcome: Progressing ?  ?Problem: Skin Integrity: ?Goal: Risk for impaired skin integrity will decrease ?Outcome: Progressing ?  ?Problem: Activity: ?Goal: Ability to tolerate increased activity will improve ?Outcome: Progressing ?  ?Problem: Clinical Measurements: ?Goal: Ability to maintain a body temperature in the normal range will improve ?Outcome: Progressing ?  ?Problem: Respiratory: ?Goal: Ability to maintain adequate ventilation will improve ?Outcome: Progressing ?Goal: Ability to maintain a clear airway will improve ?Outcome: Progressing ?  ?

## 2022-01-23 NOTE — Progress Notes (Signed)
In ?PROGRESS NOTE ?Carla [name]  JIR:[id number] DOB: 1926-02-18 DOA: 01/20/2022 ?PCP: [name], MD  ? ?Brief Narrative/Hospital Course: ? 88 y.o. female with bronchiectasis, MAI infection not on therapy, asthma, history of breast cancer, basal cell skin cancer, hypertension, lumbar radiculopathy ,seen at pulmonology office on 01/18/2022 for worsening shortness of breath and cough,was started on Augmentin due to concern for acute exacerbation of bronchiectasis subsequently seen in the ED with fever shortness of breath cough vomiting and diarrhea. ?Labs- wbc 21.5, hemoglobin 11.4 (stable), platelet count 490k.  Potassium 3.1.  Lactic acid normal.  COVID and influenza PCR negative.  High-sensitivity troponin 18.  EKG without acute ischemic changes.  Blood cultures drawn. Cxr- progressive bibasilar pulmonary infiltrate (R >L) concerning for infection or aspiration. Patient is admitted for further management  ? ?Vomited and weak ?  ?Subjective: ? ?Reports cough remains the same , cough is productive, feeling heavy, denies chest pain , still on 2liter oxygen, reports she never have to use oxygen at home ?No edema ? ?Reports feeling weaker than normal ? ?  ?Assessment and Plan: ?Principal Problem: ?  Pneumonia ?Active Problems: ?  HTN (hypertension) ?  Esophageal reflux ?  Asthma ?  Anemia ?  Thrombocytosis ?  ?Bilateral lower lobe pneumonia possible aspiration/acute hypoxic respiratory failure  ?Acute exacerbation of asthma ?H/o Bronchiectasis: ?  -Blood culture no growth, sputum sent but not acceptable for testing.  ?-has been on ceftriaxone doxycycline and Flagyl since admission ?-continue ICS/LAMA/LABA inhalers.  ?- ct chest  as continued symptoms and hypoxia ( o2 in the 80's at night ,o2 sats dropped to 84% on room air walking to the bathroom  ?-seen  Dr. Silas Flood in the past ? ?Nausea vomiting and diarrhea-reason to come to the hospital ?none in the hospital ?Lft wnl ? ?  hypokalemia/hypomagnesemia-replace ? ?Hypertension-stable on current regimen ?  ?  ?GERD-continue PPI ? ?  ?Macrocytic anemia-hemoglobin is stable at 10.2 g, check b12, tsh ?Thrombocytosis: Likely in the setting of infection downtrending ? ? ?DVT prophylaxis: enoxaparin (LOVENOX) injection 40 mg Start: 01/20/22 1000 ?Code Status:   Code Status: DNR ?Family Communication: plan of care discussed with patient at bedside. ?Patient status is: Inpatient level of care: Telemetry  ?Remains inpatient because: Ongoing IV antibiotics, hypoxia, ct chest ? ? ?Dispo: The patient is from: Home, lives by herself, obtain PT OT. ?           Anticipated disposition: HH ,  ?Mobility Assessment (last 72 hours)   ? ? Mobility Assessment   ? ? [place] Name 01/22/22 2040 01/22/22 1327 01/22/22 1015 01/21/22 2025 01/20/22 2251  ? Does patient have an order for bedrest or is patient medically unstable No - Continue assessment -- No - Continue assessment No - Continue assessment No - Continue assessment  ? What is the highest level of mobility based on the progressive mobility assessment? Level 5 (Walks with assist in room/hall) - Balance while stepping forward/back and can walk in room with assist - Complete Level 5 (Walks with assist in room/hall) - Balance while stepping forward/back and can walk in room with assist - Complete Level 5 (Walks with assist in room/hall) - Balance while stepping forward/back and can walk in room with assist - Complete Level 5 (Walks with assist in room/hall) - Balance while stepping forward/back and can walk in room with assist - Complete Level 5 (Walks with assist in room/hall) - Balance while stepping forward/back and can walk in room with assist - Complete  ? ?  [place] Name 02/18/22 2156  ?  ?  ?  ?  ? Does patient have an order for bedrest or is patient medically unstable No - Continue assessment      ? What is the highest level of mobility based on the progressive mobility assessment? Level 5 (Walks with assist  in room/hall) - Balance while stepping forward/back and can walk in room with assist - Complete      ? ?  ?  ? ?  ?  ? ?Objective: ?Vitals last 24 hrs: ?Vitals:  ? 01/22/22 2006 01/22/22 2039 01/23/22 0429 01/23/22 0903  ?BP:  131/62 110/66   ?Pulse:  97 72   ?Resp:  (!) 22 17   ?Temp:  99.4 ?F (37.4 ?C) 98.9 ?F (37.2 ?C)   ?TempSrc:  Oral Oral   ?SpO2: 91% 93% 96% 96%  ?Weight:      ?Height:      ? ?Weight change:  ? ?Physical Examination: ?General exam: AA0X3,older than stated age, weak appearing. ?HEENT:Oral mucosa moist, Ear/Nose WNL grossly, dentition normal. ?Respiratory system: bilaterally air entry present with crackles in bilateral bases, worse on right, no wheezing, no use of accessory muscle ?Cardiovascular system: S1 & S2 +, No JVD,. ?Gastrointestinal system: Abdomen soft,NT,ND, BS+ ?Nervous System:Alert, awake, moving extremities and grossly nonfocal ?Extremities: LE edema none,distal peripheral pulses palpable.  ?Skin: No rashes,no icterus. ?MSK: Normal muscle bulk,tone, power ? ?Medications reviewed:  ?Scheduled Meds: ? aspirin EC  81 mg Oral Daily  ? enoxaparin (LOVENOX) injection  40 mg Subcutaneous Q24H  ? Fluticasone-Umeclidin-Vilant  1 puff Inhalation Daily  ? guaiFENesin  600 mg Oral BID  ? losartan  50 mg Oral Daily  ? And  ? hydrochlorothiazide  12.5 mg Oral Daily  ? pantoprazole  40 mg Oral BID  ? potassium chloride  40 mEq Oral Q4H  ? ?Continuous Infusions: ? cefTRIAXone (ROCEPHIN)  IV 1 g (01/23/22 0143)  ? doxycycline (VIBRAMYCIN) IV 100 mg (01/23/22 0222)  ? metronidazole 500 mg (01/23/22 0426)  ? ? ?  ?Diet Order   ? ?       ?  Diet regular Room service appropriate? Yes; Fluid consistency: Thin  Diet effective now       ?  ? ?  ?  ? ?  ?  ? ?  ?  ?  ? ? ?Intake/Output Summary (Last 24 hours) at 01/23/2022 1001 ?Last data filed at 01/23/2022 0421 ?Gross per 24 hour  ?Intake 827.06 ml  ?Output --  ?Net 827.06 ml  ? ?Net IO Since Admission: 2,964.78 mL [01/23/22 1001]  ?Wt Readings from Last  3 Encounters:  ?01/21/22 56.2 kg  ?01/18/22 56.2 kg  ?10/04/21 58.8 kg  ?  ? ?Unresulted Labs (From admission, onward)  ? ? None  ? ?  ?Data Reviewed: I have personally reviewed following labs and imaging studies ?CBC: ?Recent Labs  ?Lab 2022-02-18 ?0026 02-18-22 ?0336 01/21/22 ?0177 01/23/22 ?9390 01/23/22 ?0725  ?WBC 21.5* 17.7* 11.7* 12.0* 11.8*  ?NEUTROABS  --   --   --  8.6*  --   ?HGB 11.4* 11.4* 10.2* 10.5* 10.5*  ?HCT 35.1* 35.5* 31.6* 31.0* 32.5*  ?MCV 109.0* 109.6* 110.5* 106.5* 108.7*  ?PLT 490* 473* 427* 436* 426*  ? ?Basic Metabolic Panel: ?Recent Labs  ?Lab Feb 18, 2022 ?0026 02-18-2022 ?0217 02-18-2022 ?3009 01/21/22 ?2330 01/23/22 ?0335  ?NA 138  --  141 139 138  ?K 3.1*  --  4.2 3.8 2.9*  ?CL 104  --  105 107 103  ?CO2 24  --  '27 23 27  '$ ?GLUCOSE 127*  --  120* 98 101*  ?BUN 23  --  21 31* 12  ?CREATININE 0.56  --  0.58 0.64 0.52  ?CALCIUM 8.7*  --  9.0 8.8* 8.3*  ?MG  --  1.7  --   --  2.1  ? ?GFR: ?Estimated Creatinine Clearance: 33.2 mL/min (by C-G formula based on SCr of 0.52 mg/dL). ?Liver Function Tests: ?Recent Labs  ?Lab 01/20/22 ?0026  ?AST 13*  ?ALT 11  ?ALKPHOS 84  ?BILITOT 0.7  ?PROT 6.5  ?ALBUMIN 3.2*  ? ?No results for input(s): LIPASE, AMYLASE in the last 168 hours. ?No results for input(s): AMMONIA in the last 168 hours. ?Coagulation Profile: ?No results for input(s): INR, PROTIME in the last 168 hours. ?BNP (last 3 results) ?No results for input(s): PROBNP in the last 8760 hours. ?HbA1C: ?No results for input(s): HGBA1C in the last 72 hours. ?CBG: ?No results for input(s): GLUCAP in the last 168 hours. ?Lipid Profile: ?No results for input(s): CHOL, HDL, LDLCALC, TRIG, CHOLHDL, LDLDIRECT in the last 72 hours. ?Thyroid Function Tests: ?No results for input(s): TSH, T4TOTAL, FREET4, T3FREE, THYROIDAB in the last 72 hours. ?Sepsis Labs: ?Recent Labs  ?Lab 01/20/22 ?0026  ?LATICACIDVEN 1.1  ? ? ?Recent Results (from the past 240 hour(s))  ?Respiratory or Resp and Sputum Culture     Status: None  ?  Collection Time: 01/19/22 12:02 PM  ? Specimen: Sputum  ?Result Value Ref Range Status  ? MICRO NUMBER: [id number]  Final  ? SPECIMEN QUALITY: Inadequate  Final  ? Source SPUTUM  Final  ? STATUS: FINAL  Final  ? GRAM STAIN:   Final  ?  The spu

## 2022-01-23 NOTE — Evaluation (Signed)
Occupational Therapy Evaluation ?Patient Details ?Name: Carla [name] ?MRN: [id number] ?DOB: [birth year] ?Today's Date: 01/23/2022 ? ? ?History of Present Illness 88 y.o. female with bronchiectasis, MAI infection not on therapy, asthma, history of breast cancer, basal cell skin cancer, hypertension, lumbar radiculopathy ,seen at pulmonology office on 01/18/2022 for worsening shortness of breath and cough,was started on Augmentin due to concern for acute exacerbation of bronchiectasis subsequently seen in the ED with fever shortness of breath cough vomiting and diarrhea.  ? ?Clinical Impression ?  ?Patient is a 88 year old female who was admitted for above. Patient was living at home alone prior level. Currently, patietn is min guard with safety cues for ADLs with education on deep breathing strategies. Patient was noted to drop O2 to 84% on RA after ADL tasks and was able to deep breath up to 87% in standing with patient reporting feeling fatigued. Patient in sitting was able to deep breath back up to 92% on RA. MD updated. Patient would continue to benefit from skilled OT services at this time while admitted and after d/c to address noted deficits in order to improve overall safety and independence in ADLs.  ? ?   ? ?Recommendations for follow up therapy are one component of a multi-disciplinary discharge planning process, led by the attending physician.  Recommendations may be updated based on patient status, additional functional criteria and insurance authorization.  ? ?Follow Up Recommendations ? Home health OT  ?  ?Assistance Recommended at Discharge Frequent or constant Supervision/Assistance  ?Patient can return home with the following Assistance with cooking/housework;Direct supervision/assist for financial management;Assist for transportation;Help with stairs or ramp for entrance;Direct supervision/assist for medications management ? ?  ?Functional Status Assessment ? Patient has had a recent decline in their  functional status and demonstrates the ability to make significant improvements in function in a reasonable and predictable amount of time.  ?Equipment Recommendations ? None recommended by OT  ?  ?Recommendations for Other Services   ? ? ?  ?Precautions / Restrictions Precautions ?Precautions: Fall ?Precaution Comments: monitor O2 ?Restrictions ?Weight Bearing Restrictions: No  ? ?  ? ?Mobility Bed Mobility ?Overal bed mobility: Modified Independent ?  ?  ?  ?  ?  ?  ?  ?  ? ?Transfers ?  ?  ?  ?  ?  ?  ?  ?  ?  ?  ?  ? ?  ?Balance Overall balance assessment: Needs assistance ?  ?Sitting balance-Leahy Scale: Good ?  ?  ?Standing balance support: During functional activity, No upper extremity supported ?Standing balance-Leahy Scale: Fair ?  ?  ?  ?  ?  ?  ?  ?  ?  ?  ?  ?  ?   ? ?ADL either performed or assessed with clinical judgement  ? ?ADL Overall ADL's : Needs assistance/impaired ?Eating/Feeding: Modified independent;Sitting ?  ?Grooming: Wash/dry face;Oral care;Set up;Standing ?Grooming Details (indicate cue type and reason): at sink ?Upper Body Bathing: Set up;Standing ?Upper Body Bathing Details (indicate cue type and reason): at sink ?Lower Body Bathing: Minimal assistance;Sit to/from stand;Sitting/lateral leans ?Lower Body Bathing Details (indicate cue type and reason): at sink with noted fatigue after completing task. ?Upper Body Dressing : Set up;Standing ?  ?Lower Body Dressing: Set up;Sitting/lateral leans;Sit to/from stand;Supervision/safety ?  ?Toilet Transfer: Min guard;Rolling walker (2 wheels);Regular Toilet;Ambulation ?Toilet Transfer Details (indicate cue type and reason): with increased time ?Toileting- Water quality scientist and Hygiene: Min guard;Sit to/from stand ?Toileting - Clothing Manipulation Details (  indicate cue type and reason): with RW ?  ?  ?Functional mobility during ADLs: Min guard;Rolling walker (2 wheels) ?   ? ? ? ?Vision Baseline Vision/History: 1 Wears glasses ?Patient Visual  Report: No change from baseline ?   ?   ?Perception   ?  ?Praxis   ?  ? ?Pertinent Vitals/Pain Pain Assessment ?Pain Assessment: No/denies pain  ? ? ? ?Hand Dominance Right ?  ?Extremity/Trunk Assessment Upper Extremity Assessment ?Upper Extremity Assessment: Overall WFL for tasks assessed ?  ?Lower Extremity Assessment ?Lower Extremity Assessment: Defer to PT evaluation ?  ?Cervical / Trunk Assessment ?Cervical / Trunk Assessment: Normal ?  ?Communication Communication ?Communication: No difficulties ?  ?Cognition Arousal/Alertness: Awake/alert ?Behavior During Therapy: [facility] for tasks assessed/performed ?Overall Cognitive Status: Within Functional Limits for tasks assessed ?  ?  ?  ?  ?  ?  ?  ?  ?  ?  ?  ?  ?  ?  ?  ?  ?General Comments: patient was noted to have some confusion with MD education provided prior to session. patient was fixated on not being able to transition home today. patient was noted to report she had never used O2 before in her life prior to today when patient was noted in chart to have needed supplemental O2 during her stay in the hosptial. ?  ?  ?General Comments    ? ?  ?Exercises   ?  ?Shoulder Instructions    ? ? ?Home Living Family/patient expects to be discharged to:: Private residence ?Living Arrangements: Alone ?  ?Type of Home: House ?Home Access: Level entry ?  ?  ?Home Layout: One level ?  ?  ?  ?  ?Bathroom Toilet: Handicapped height ?  ?  ?Home Equipment: Conservation officer, nature (2 wheels);Rollator (4 wheels);Grab bars - toilet;Grab bars - tub/shower;Toilet riser ?  ?  ?  ? ?  ?Prior Functioning/Environment Prior Level of Function : Independent/Modified Independent ?  ?  ?  ?  ?  ?  ?Mobility Comments: Has used RW/rollator since fall in August. ?  ?  ? ?  ?  ?OT Problem List: Decreased activity tolerance;Impaired balance (sitting and/or standing);Cardiopulmonary status limiting activity ?  ?   ?OT Treatment/Interventions: Self-care/ADL training;Therapeutic exercise;Energy conservation;DME  and/or AE instruction;Neuromuscular education;Therapeutic activities;Balance training;Patient/family education  ?  ?OT Goals(Current goals can be found in the care plan section) Acute Rehab OT Goals ?Patient Stated Goal: to go home today ?Time For Goal Achievement: 02/06/22 ?Potential to Achieve Goals: Good  ?OT Frequency: Min 2X/week ?  ? ?Co-evaluation   ?  ?  ?  ?  ? ?  ?AM-PAC OT "6 Clicks" Daily Activity     ?Outcome Measure Help from another person eating meals?: None ?Help from another person taking care of personal grooming?: None ?Help from another person toileting, which includes using toliet, bedpan, or urinal?: A Little ?Help from another person bathing (including washing, rinsing, drying)?: A Little ?Help from another person to put on and taking off regular upper body clothing?: A Little ?Help from another person to put on and taking off regular lower body clothing?: A Little ?6 Click Score: 20 ?  ?End of Session Equipment Utilized During Treatment: Rolling walker (2 wheels) ?Nurse Communication: Mobility status;Other (comment) (O2 during session) ? ?Activity Tolerance: Patient tolerated treatment well ?Patient left: in chair;with call bell/phone within reach;with chair alarm set ? ?OT Visit Diagnosis: Unsteadiness on feet (R26.81)  ?              ?  Time: 7989-2119 ?OT Time Calculation (min): 33 min ?Charges:  OT General Charges ?$OT Visit: 1 Visit ?OT Evaluation ?$OT Eval Low Complexity: 1 Low ?OT Treatments ?$Self Care/Home Management : 8-22 mins ? ?Martena Emanuele OTR/L, MS ?Acute Rehabilitation Department ?Office# [phone] ?Pager# [phone] ? ? ?[place] ?01/23/2022, 12:09 PM ?

## 2022-01-24 DIAGNOSIS — J189 Pneumonia, unspecified organism: Secondary | ICD-10-CM | POA: Diagnosis not present

## 2022-01-24 LAB — CBC WITH DIFFERENTIAL/PLATELET
Abs Immature Granulocytes: 0.18 10*3/uL — ABNORMAL HIGH (ref 0.00–0.07)
Basophils Absolute: 0.1 10*3/uL (ref 0.0–0.1)
Basophils Relative: 0 %
Eosinophils Absolute: 0.1 10*3/uL (ref 0.0–0.5)
Eosinophils Relative: 1 %
HCT: 36.4 % (ref 36.0–46.0)
Hemoglobin: 11.5 g/dL — ABNORMAL LOW (ref 12.0–15.0)
Immature Granulocytes: 1 %
Lymphocytes Relative: 16 %
Lymphs Abs: 3 10*3/uL (ref 0.7–4.0)
MCH: 34.7 pg — ABNORMAL HIGH (ref 26.0–34.0)
MCHC: 31.6 g/dL (ref 30.0–36.0)
MCV: 110 fL — ABNORMAL HIGH (ref 80.0–100.0)
Monocytes Absolute: 2 10*3/uL — ABNORMAL HIGH (ref 0.1–1.0)
Monocytes Relative: 11 %
Neutro Abs: 13.2 10*3/uL — ABNORMAL HIGH (ref 1.7–7.7)
Neutrophils Relative %: 71 %
Platelets: 460 10*3/uL — ABNORMAL HIGH (ref 150–400)
RBC: 3.31 MIL/uL — ABNORMAL LOW (ref 3.87–5.11)
RDW: 13.5 % (ref 11.5–15.5)
WBC: 18.6 10*3/uL — ABNORMAL HIGH (ref 4.0–10.5)
nRBC: 0 % (ref 0.0–0.2)

## 2022-01-24 LAB — BASIC METABOLIC PANEL
Anion gap: 5 (ref 5–15)
BUN: 13 mg/dL (ref 8–23)
CO2: 26 mmol/L (ref 22–32)
Calcium: 8.7 mg/dL — ABNORMAL LOW (ref 8.9–10.3)
Chloride: 107 mmol/L (ref 98–111)
Creatinine, Ser: 0.57 mg/dL (ref 0.44–1.00)
GFR, Estimated: >60 mL/min (ref >60)
Glucose, Bld: 98 mg/dL (ref 70–99)
Potassium: 4.4 mmol/L (ref 3.5–5.1)
Sodium: 138 mmol/L (ref 135–145)

## 2022-01-24 LAB — LACTIC ACID, PLASMA: Lactic Acid, Venous: 1.3 mmol/L (ref 0.5–1.9)

## 2022-01-24 MED ORDER — MIRTAZAPINE 15 MG PO TBDP
15.0000 mg | ORAL_TABLET | Freq: Every day | ORAL | Status: DC
  Administered 2022-01-24 – 2022-01-25 (×2): 15 mg via ORAL
  Filled 2022-01-24 (×2): qty 1

## 2022-01-24 NOTE — Progress Notes (Signed)
?PROGRESS NOTE ? ?Carla [name]  ?DOB: 06/02/1926  ?PCP: [name], MD ?RDE:[id number]  ?DOA: 01/20/2022 ? LOS: 4 days  ?Hospital Day: 5 ? ?Brief narrative: ?Carla [name] is a 88 y.o. female with PMH significant for bronchiectasis, MAI infection not on therapy, asthma, history of breast cancer, basal cell skin cancer, hypertension, lumbar radiculopathy. ?Patient was seen at pulmonology office on 01/18/2022 for worsening shortness of breath and cough, was started on Augmentin due to concern for acute exacerbation of bronchiectasis subsequently seen in the ED with fever shortness of breath cough vomiting and diarrhea. ?In the ED, labs showed elevated WBC count of 21.5, ?Chest x-ray showed progressive bibasilar pulmonary infiltrate concerning for infection or aspiration. ?Urinalysis showed positive nitrite and leukocyte esterase. ?Blood culture was sent. ?Patient was admitted to hospitalist service ? ?Subjective: ?Patient was seen and examined this morning.  Pleasant elderly Caucasian female.  Sitting up in chair.  Not in distress.  Not on supplemental oxygen.  Denies any burning of urination.  States he had 2 episodes of loose bowel movement in last 24 hours. ?Labs this morning with WBC rising to 18.6 from 11.8 yesterday. ? ?Principal Problem: ?  Pneumonia ?Active Problems: ?  HTN (hypertension) ?  Esophageal reflux ?  Asthma ?  Anemia ?  Thrombocytosis ?  ? ?Assessment and Plan: ?Bilateral lower lobe pneumonia possible aspiration ?Acute hypoxic respiratory failure  ?Acute exacerbation of asthma ?H/o Bronchiectasis: ?-Patient was seen at pulmonology office on 4/25 with worsening shortness of breath, cough in the setting of bronchiectasis. ?-Labs showed leukocytosis with chest x-ray showing progressive bibasilar pulm infiltrates.   ?-Blood culture did not show any growth.   ?-CT scan of chest on 4/30 showed dense right lower lobe pneumonia as well as multiple additional areas of patchy opacity and nodularity  throughout both lungs favored to represent multifocal pneumonia. ?-Since admission, patient was started on ceftriaxone, doxycycline and Flagyl.   ?-continue ICS/LAMA/LABA inhalers.  ?-Currently not requiring supplemental oxygen. ?-Patient usually sees Dr. Silas Flood in the office.  She was seen by Dr. Shearon Stalls on 4/25 before this presentation. ?  ?Nausea vomiting and diarrhea ?-Patient had the symptoms prior to presentation.  ?-Gradually improving the patient still has diarrhea this morning. ? ?Leukocytosis ?-WBC count significantly up to 18.6 today compared to 11.8 yesterday.  No fever.  Watch for worsening of diarrhea.  Repeat labs tomorrow. ? ?hypokalemia/hypomagnesemia-replace ?-Potassium level much better after replacement given yesterday.  Magnesium level normal. ?Recent Labs  ?Lab 01/20/22 ?0026 01/20/22 ?0217 01/20/22 ?6314 01/21/22 ?9702 01/23/22 ?6378 01/24/22 ?0328  ?K 3.1*  --  4.2 3.8 2.9* 4.4  ?MG  --  1.7  --   --  2.1  --   ?  ?Hypertension ?-Okay to continue losartan.  Keep HCTZ on hold while having diarrhea. ?   ?GERD ?-continue PPI ?  ? Chronic compression fractures of L1, L2, and L3.  ?-Per radiologist, these appear grossly similar to previous lumbar spine radiographs from ?11/22/2021.  ? ?Goals of care ?  Code Status: DNR  ? ? ?Mobility: Encourage ambulation. ? ?Skin assessment:  ?  ? ?Nutritional status:  ?Body mass index is 23.41 kg/m?.  ?  ?  ? ? ? ? ?Diet:  ?Diet Order   ? ?       ?  Diet regular Room service appropriate? Yes; Fluid consistency: Thin  Diet effective now       ?  ? ?  ?  ? ?  ? ? ?  DVT prophylaxis:  ?enoxaparin (LOVENOX) injection 40 mg Start: 01/20/22 1000 ?  ?Antimicrobials: Rocephin, Flagyl, doxycycline ?Fluid: None currently ?Consultants: None ?Family Communication: Called and updated patient's daughter Ms. Helene Kelp on the phone this afternoon. ? ?Status is: Inpatient ? ?Continue in-hospital care because: Diarrhea ?Level of care: Telemetry  ? ?Dispo: The patient is from: Home ?              Anticipated d/c is to: Hopefully home in 1 to 2 days with home health PT ?             Patient currently is not medically stable to d/c. ?  Difficult to place patient No ? ? ? ? ?Infusions:  ? cefTRIAXone (ROCEPHIN)  IV 1 g (01/24/22 0121)  ? doxycycline (VIBRAMYCIN) IV 100 mg (01/24/22 1300)  ? metronidazole 500 mg (01/24/22 1610)  ? ? ?Scheduled Meds: ? aspirin EC  81 mg Oral Daily  ? enoxaparin (LOVENOX) injection  40 mg Subcutaneous Q24H  ? Fluticasone-Umeclidin-Vilant  1 puff Inhalation Daily  ? guaiFENesin  600 mg Oral BID  ? losartan  50 mg Oral Daily  ? mirtazapine  15 mg Oral QHS  ? pantoprazole  40 mg Oral BID  ? ? ?PRN meds: ?acetaminophen **OR** acetaminophen, diphenhydrAMINE **AND** acetaminophen, albuterol, diazepam  ? ?Antimicrobials: ?Anti-infectives (From admission, onward)  ? ? Start     Dose/Rate Route Frequency Ordered Stop  ? 01/21/22 0200  cefTRIAXone (ROCEPHIN) 1 g in sodium chloride 0.9 % 100 mL IVPB       ? 1 g ?200 mL/hr over 30 Minutes Intravenous Every 24 hours 01/20/22 0257    ? 01/20/22 1400  doxycycline (VIBRAMYCIN) 100 mg in sodium chloride 0.9 % 250 mL IVPB       ? 100 mg ?125 mL/hr over 120 Minutes Intravenous Every 12 hours 01/20/22 0345    ? 01/20/22 1200  doxycycline (VIBRA-TABS) tablet 100 mg  Status:  Discontinued       ? 100 mg Oral Every 12 hours 01/20/22 0257 01/20/22 0345  ? 01/20/22 0400  metroNIDAZOLE (FLAGYL) IVPB 500 mg       ? 500 mg ?100 mL/hr over 60 Minutes Intravenous Every 12 hours 01/20/22 0257    ? 01/20/22 0130  cefTRIAXone (ROCEPHIN) 1 g in sodium chloride 0.9 % 100 mL IVPB       ? 1 g ?200 mL/hr over 30 Minutes Intravenous  Once 01/20/22 0129 01/20/22 0240  ? 01/20/22 0130  doxycycline (VIBRA-TABS) tablet 100 mg       ? 100 mg Oral  Once 01/20/22 0129 01/20/22 0211  ? ?  ? ? ?Objective: ?Vitals:  ? 01/24/22 0904 01/24/22 1324  ?BP: 116/74 (!) 115/54  ?Pulse: 89 76  ?Resp: 18 18  ?Temp: 97.6 ?F (36.4 ?C) 98.2 ?F (36.8 ?C)  ?SpO2: 94% 94%  ? ?No  intake or output data in the 24 hours ending 01/24/22 1515 ?Filed Weights  ? 01/21/22 2024  ?Weight: 56.2 kg  ? ?Weight change:  ?Body mass index is 23.41 kg/m?.  ? ?Physical Exam: ?General exam: Pleasant, elderly Caucasian female.  Sitting up in chair.  Not in distress ?Skin: No rashes, lesions or ulcers. ?HEENT: Atraumatic, normocephalic, no obvious bleeding ?Lungs: Clear to auscultation bilaterally ?CVS: Regular rate and rhythm, no murmur ?GI/Abd soft, nontender, nondistended, bowel sound present ?CNS: Alert, awake, oriented x3 ?Psychiatry: Mood appropriate ?Extremities: No pedal edema, no calf tenderness ? ?Data Review: I have personally reviewed the laboratory data and  studies available. ? ?F/u labs ordered ?Unresulted Labs (From admission, onward)  ? ?  Start     Ordered  ? 01/25/22 0500  CBC with Differential/Platelet  Tomorrow morning,   R       ?Question:  Specimen collection method  Answer:  Lab=Lab collect  ? 01/24/22 1505  ? 01/25/22 6045  Basic metabolic panel  Tomorrow morning,   R       ?Question:  Specimen collection method  Answer:  Lab=Lab collect  ? 01/24/22 1505  ? ?  ?  ? ?  ? ? ?Signed, ?Terrilee Croak, MD ?[facility] ?01/24/2022 ? ? ? ? ? ? ? ? ? ? ? ? ?

## 2022-01-24 NOTE — TOC Progression Note (Signed)
Transition of Care (TOC) - Progression Note  ? ? ?Patient Details  ?Name: Carla [name] ?MRN: [id number] ?Date of Birth: [birth year] ? ?Transition of Care (TOC) CM/SW Contact  ?Purcell Mouton, RN ?Phone Number: ?01/24/2022, 3:53 PM ? ?Clinical Narrative:    ?Spoke with pt and daughters at bedside concerning discharge plan to home with [facility]. Explained [place] agencies and names to pt and daughter. Pt will need HHPT/OT/NA/RN. Will follow up in AM with agencies.  ? ? ?Expected Discharge Plan: [place] ?Barriers to Discharge: No Barriers Identified ? ?Expected Discharge Plan and Services ?Expected Discharge Plan: [place] ?  ?  ?Post Acute Care Choice: Home Health, Durable Medical Equipment ?Living arrangements for the past 2 months: [place] ?                ?  ?  ?  ?  ?  ?  ?  ?  ?  ?  ? ? ?Social Determinants of Health (SDOH) Interventions ?  ? ?Readmission Risk Interventions ?   ? View : No data to display.  ?  ?  ?  ? ? ?

## 2022-01-24 NOTE — Progress Notes (Signed)
Physical Therapy Treatment ?Patient Details ?Name: Carla [name] ?MRN: [id number] ?DOB: [birth year] ?Today's Date: 01/24/2022 ? ? ?History of Present Illness 88 y.o. female with bronchiectasis, MAI infection not on therapy, asthma, history of breast cancer, basal cell skin cancer, hypertension, lumbar radiculopathy ,seen at pulmonology office on 01/18/2022 for worsening shortness of breath and cough,was started on Augmentin due to concern for acute exacerbation of bronchiectasis subsequently seen in the ED with fever shortness of breath cough vomiting and diarrhea. ? ?  ?PT Comments  ? ? Pt seen this pm, family present. Pt agreed to mobility, noted some confusion and concerns for safety.  Pt demonstrated ability to transfer and perform gait training on level surface with MinGuard. On RA at rest, O2 sats at 95%, which decreased to 85% upon exertion.  Pt did appear weak during session and shakey due to hospital stay and ongoing diarrhea. Pt currently lives alone with family 30 minutes away. Due to lack of support, high fall risk, general weakness throughout, new O2 needs. Pt may benefit from short stay at [place] ([facility]) if possible. Will continue to progress per POC.  ?  ?Recommendations for follow up therapy are one component of a multi-disciplinary discharge planning process, led by the attending physician.  Recommendations may be updated based on patient status, additional functional criteria and insurance authorization. ? ?Follow Up Recommendations ? Skilled nursing-short term rehab (<3 hours/day) ?  ?  ?Assistance Recommended at Discharge Intermittent Supervision/Assistance  ?Patient can return home with the following Assist for transportation;A lot of help with walking and/or transfers;A little help with bathing/dressing/bathroom;Assistance with cooking/housework ?  ?Equipment Recommendations ? None recommended by PT  ?  ?Recommendations for Other Services   ? ? ?  ?Precautions / Restrictions  Precautions ?Precautions: Fall ?Precaution Comments: monitor O2 ?Restrictions ?Weight Bearing Restrictions: No  ?  ? ?Mobility ? Bed Mobility ?  ?  ?  ?  ?  ?  ?  ?General bed mobility comments: Pt up in chair upon arrival ?  ? ?Transfers ?Overall transfer level: Needs assistance ?Equipment used: Rolling walker (2 wheels) ?Transfers: Sit to/from Stand ?Sit to Stand: Min guard, Supervision ?  ?  ?  ?  ?  ?  ?  ? ?Ambulation/Gait ?Ambulation/Gait assistance: Min guard, Supervision ?Gait Distance (Feet): 200 Feet ?Assistive device: Rolling walker (2 wheels) ?Gait Pattern/deviations: Step-through pattern, Trunk flexed ?  ?  ?  ?General Gait Details:  (Cues needed for RW management) ? ? ?Stairs ?  ?  ?  ?  ?  ? ? ?Wheelchair Mobility ?  ? ?Modified Rankin (Stroke Patients Only) ?  ? ? ?  ?Balance Overall balance assessment: Needs assistance ?  ?Sitting balance-Leahy Scale: Good ?  ?  ?Standing balance support: During functional activity, No upper extremity supported ?Standing balance-Leahy Scale: Fair ?  ?  ?  ?  ?  ?  ?  ?  ?  ?  ?  ?  ?  ? ?  ?Cognition Arousal/Alertness: Awake/alert ?Behavior During Therapy: [facility] for tasks assessed/performed ?Overall Cognitive Status: Within Functional Limits for tasks assessed (Cognitive impairments at baseline) ?  ?  ?  ?  ?  ?  ?  ?  ?  ?  ?  ?  ?  ?  ?  ?  ?General Comments:  (Poor safety awareness) ?  ?  ? ?  ?Exercises   ? ?  ?General Comments General comments (skin integrity, edema, etc.): Discussed with pt  and pt's family concerns of d/c home after hospital stay since pt will be alone without help and ongoing diarrhea. ?  ?  ? ?Pertinent Vitals/Pain    ? ? ?Home Living   ?  ?  ?  ?  ?  ?  ?  ?  ?  ?   ?  ?Prior Function    ?  ?  ?   ? ?PT Goals (current goals can now be found in the care plan section) Acute Rehab PT Goals ?Patient Stated Goal: go home ? ?  ?Frequency ? ? ? Min 3X/week ? ? ? ?  ?PT Plan Discharge plan needs to be updated  ? ? ?Co-evaluation   ?  ?  ?  ?  ? ?   ?AM-PAC PT "6 Clicks" Mobility   ?Outcome Measure ? Help needed turning from your back to your side while in a flat bed without using bedrails?: None ?Help needed moving from lying on your back to sitting on the side of a flat bed without using bedrails?: None ?Help needed moving to and from a bed to a chair (including a wheelchair)?: A Little ?Help needed standing up from a chair using your arms (e.g., wheelchair or bedside chair)?: A Little ?Help needed to walk in hospital room?: A Little ?Help needed climbing 3-5 steps with a railing? : A Lot ?6 Click Score: 19 ? ?  ?End of Session Equipment Utilized During Treatment: Gait belt ?Activity Tolerance: Patient tolerated treatment well ?Patient left: in chair;with call bell/phone within reach;with chair alarm set;with family/visitor present ?Nurse Communication: Mobility status ?PT Visit Diagnosis: History of falling (Z91.81);Muscle weakness (generalized) (M62.81) ?  ? ? ?Time: 1550-1620 ?PT Time Calculation (min) (ACUTE ONLY): 30 min ? ?Charges:  $Gait Training: 8-22 mins ?$Therapeutic Activity: 8-22 mins          ?          ?Mikel Cella, PTA ? ? ? ?Josie Dixon ?01/24/2022, 4:31 PM ? ?

## 2022-01-25 DIAGNOSIS — J189 Pneumonia, unspecified organism: Secondary | ICD-10-CM | POA: Diagnosis not present

## 2022-01-25 LAB — CBC WITH DIFFERENTIAL/PLATELET
Abs Immature Granulocytes: 0.12 10*3/uL — ABNORMAL HIGH (ref 0.00–0.07)
Basophils Absolute: 0 10*3/uL (ref 0.0–0.1)
Basophils Relative: 0 %
Eosinophils Absolute: 0.1 10*3/uL (ref 0.0–0.5)
Eosinophils Relative: 1 %
HCT: 31.9 % — ABNORMAL LOW (ref 36.0–46.0)
Hemoglobin: 10.3 g/dL — ABNORMAL LOW (ref 12.0–15.0)
Immature Granulocytes: 1 %
Lymphocytes Relative: 13 %
Lymphs Abs: 2.1 10*3/uL (ref 0.7–4.0)
MCH: 35.5 pg — ABNORMAL HIGH (ref 26.0–34.0)
MCHC: 32.3 g/dL (ref 30.0–36.0)
MCV: 110 fL — ABNORMAL HIGH (ref 80.0–100.0)
Monocytes Absolute: 1.6 10*3/uL — ABNORMAL HIGH (ref 0.1–1.0)
Monocytes Relative: 10 %
Neutro Abs: 11.8 10*3/uL — ABNORMAL HIGH (ref 1.7–7.7)
Neutrophils Relative %: 75 %
Platelets: 386 10*3/uL (ref 150–400)
RBC: 2.9 MIL/uL — ABNORMAL LOW (ref 3.87–5.11)
RDW: 13.4 % (ref 11.5–15.5)
WBC: 15.8 10*3/uL — ABNORMAL HIGH (ref 4.0–10.5)
nRBC: 0 % (ref 0.0–0.2)

## 2022-01-25 LAB — BASIC METABOLIC PANEL
Anion gap: 5 (ref 5–15)
BUN: 11 mg/dL (ref 8–23)
CO2: 26 mmol/L (ref 22–32)
Calcium: 8.4 mg/dL — ABNORMAL LOW (ref 8.9–10.3)
Chloride: 105 mmol/L (ref 98–111)
Creatinine, Ser: 0.54 mg/dL (ref 0.44–1.00)
GFR, Estimated: >60 mL/min (ref >60)
Glucose, Bld: 97 mg/dL (ref 70–99)
Potassium: 4.1 mmol/L (ref 3.5–5.1)
Sodium: 136 mmol/L (ref 135–145)

## 2022-01-25 NOTE — [place] FL2 (Signed)
?[place] MEDICAID FL2 LEVEL OF CARE SCREENING TOOL  ?  ? ?IDENTIFICATION  ?Patient Name: ?Carla [name] Birthdate: Dec 30, 1925 Sex: female Admission Date (Current Location): ?01/20/2022  ?[place] and [place] Number: ? [place] ?  Facility and Address:  ?[facility],  [address] [place], [place] ?     Provider Number: ?[id number]  ?Attending Physician Name and Address:  ?Terrilee Croak, MD ? Relative Name and Phone Number:  ?Timmothy Euler Daughter [phone]  [phone] cell, Ellery Plunk Daughter [phone] ?   ?Current Level of Care: ?Hospital Recommended Level of Care: ?[place] Prior Approval Number: ?  ? ?Date Approved/Denied: ?  PASRR Number: ?[id number] A ? ?Discharge Plan: ?SNF ?  ? ?Current Diagnoses: ?Patient Active Problem List  ? Diagnosis Date Noted  ? Pneumonia 01/20/2022  ? Anemia 01/20/2022  ? Thrombocytosis 01/20/2022  ? Asthma 01/10/2017  ? CHEK2-related breast cancer ([place]) 07/28/2016  ? Genetic testing 07/05/2016  ? Family history of breast cancer   ? Family history of prostate cancer   ? Breast cancer of upper-outer quadrant of right female breast ([place]) 04/07/2016  ? MAI (mycobacterium avium-intracellulare) ([place]) 03/07/2016  ? 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 Postoperative anemia due to acute blood loss 12/14/2014  ? Pulmonary nodules 07/22/2014  ? Bronchiectasis without acute exacerbation ([place]) 07/22/2014  ? HTN (hypertension) 05/20/2014  ? Esophageal reflux 05/20/2014  ? OA (osteoarthritis) of knee 05/19/2014  ? ? ?Orientation RESPIRATION BLADDER Height & Weight   ?  ?Self, Time, Situation, Place ? O2 (O2 2L) Continent Weight: 56.2 kg ?Height:  5' 1"  (154.9 cm)  ?BEHAVIORAL SYMPTOMS/MOOD NEUROLOGICAL BOWEL NUTRITION STATUS  ?    Continent Diet (Regular)  ?AMBULATORY STATUS COMMUNICATION OF NEEDS Skin   ?Extensive Assist Verbally Other (Comment) (Ecchymosis) ?  ?  ?  ?    ?     ?     ? ? ?Personal Care Assistance  Level of Assistance  ?Bathing, Feeding, Dressing Bathing Assistance: Limited assistance ?Feeding assistance: Independent ?Dressing Assistance: Limited assistance ?   ? ?Functional Limitations Info  ?Sight, Hearing, Speech Sight Info: Adequate ?Hearing Info: Impaired ?Speech Info: Adequate  ? ? ?SPECIAL CARE FACTORS FREQUENCY  ?PT (By licensed PT), OT (By licensed OT)   ?  ?PT Frequency: x5 week ?OT Frequency: x5 week ?  ?  ?  ?   ? ? ?Contractures Contractures Info: Not present  ? ? ?Additional Factors Info  ?Code Status, Allergies Code Status Info: DNR ?Allergies Info: Azithromycin, Tape ?  ?  ?  ?   ? ?Current Medications (01/25/2022):  This is the current hospital active medication list ?Current Facility-Administered Medications  ?Medication Dose Route Frequency Provider Last Rate Last Admin  ? acetaminophen (TYLENOL) tablet 650 mg  650 mg Oral Q6H PRN Shela Leff, MD      ? Or  ? acetaminophen (TYLENOL) suppository 650 mg  650 mg Rectal Q6H PRN Shela Leff, MD      ? diphenhydrAMINE (BENADRYL) capsule 25 mg  25 mg Oral QHS PRN Kc, Ramesh, MD      ? And  ? acetaminophen (TYLENOL) tablet 500 mg  500 mg Oral QHS PRN Antonieta Pert, MD   500 mg at 01/21/22 2054  ? albuterol (PROVENTIL) (2.5 MG/3ML) 0.083% nebulizer solution 2.5 mg  2.5 mg Nebulization Q6H PRN Kc, Ramesh, MD      ? aspirin EC tablet 81 mg  81 mg Oral Daily Shela Leff, MD   81 mg  at 01/25/22 0905  ? cefTRIAXone (ROCEPHIN) 1 g in sodium chloride 0.9 % 100 mL IVPB  1 g Intravenous Q24H Shela Leff, MD   Stopped at 01/25/22 0254  ? diazepam (VALIUM) tablet 5 mg  5 mg Oral QHS PRN Antonieta Pert, MD   5 mg at 01/24/22 2224  ? doxycycline (VIBRAMYCIN) 100 mg in sodium chloride 0.9 % 250 mL IVPB  100 mg Intravenous Q12H Shela Leff, MD 125 mL/hr at 01/25/22 1507 100 mg at 01/25/22 1507  ? enoxaparin (LOVENOX) injection 40 mg  40 mg Subcutaneous Q24H Shela Leff, MD   40 mg at 01/25/22 0905  ? Fluticasone-Umeclidin-Vilant  200-62.5-25 MCG/ACT AEPB 1 puff  1 puff Inhalation Daily Shela Leff, MD   1 puff at 01/25/22 0815  ? guaiFENesin (MUCINEX) 12 hr tablet 600 mg  600 mg Oral BID Shela Leff, MD   600 mg at 01/25/22 7510  ? losartan (COZAAR) tablet 50 mg  50 mg Oral Daily Shela Leff, MD   50 mg at 01/25/22 2585  ? mirtazapine (REMERON SOL-TAB) disintegrating tablet 15 mg  15 mg Oral QHS Terrilee Croak, MD   15 mg at 01/24/22 2224  ? pantoprazole (PROTONIX) EC tablet 40 mg  40 mg Oral BID Shela Leff, MD   40 mg at 01/25/22 2778  ? ? ? ?Discharge Medications: ?Please see discharge summary for a list of discharge medications. ? ?Relevant Imaging Results: ? ?Relevant Lab Results: ? ? ?Additional Information ?SS#[id number] ? ?Mavin Dyke, RN ? ? ? ? ?

## 2022-01-25 NOTE — Care Management Important Message (Signed)
Important Message ? ?Patient Details IM Letter placed in Patients room. ?Name: Carla [name] ?MRN: [id number] ?Date of Birth: [birth year] ? ? ?Medicare Important Message Given:  Yes ? ? ? ? ?Kerin Salen ?01/25/2022, 2:38 PM ?

## 2022-01-25 NOTE — Progress Notes (Signed)
?PROGRESS NOTE ? ?Carla [name]  ?DOB: 07-Feb-1926  ?PCP: [name], MD ?ERX:[id number]  ?DOA: 01/20/2022 ? LOS: 5 days  ?Hospital Day: 6 ? ?Brief narrative: ?Carla [name] is a 88 y.o. female with PMH significant for bronchiectasis, MAI infection not on therapy, asthma, history of breast cancer, basal cell skin cancer, hypertension, lumbar radiculopathy. ?Patient was seen at pulmonology office on 01/18/2022 for worsening shortness of breath and cough, was started on Augmentin due to concern for acute exacerbation of bronchiectasis subsequently seen in the ED with fever shortness of breath cough vomiting and diarrhea. ?In the ED, labs showed elevated WBC count of 21.5, ?Chest x-ray showed progressive bibasilar pulmonary infiltrate concerning for infection or aspiration. ?Urinalysis showed positive nitrite and leukocyte esterase. ?Blood culture was sent. ?Patient was admitted to hospitalist service ? ?Subjective: ?Patient was seen and examined this morning.  ?Sitting up in chair.  Not in distress.  Not on supplemental oxygen at rest.  Low-flow oxygen during the night.  ?Patient states diarrhea is improving.  No fever.  WBC count improving.  ?PT recommended SNF but patient wants to go home when stable. ? ?Principal Problem: ?  Pneumonia ?Active Problems: ?  HTN (hypertension) ?  Esophageal reflux ?  Asthma ?  Anemia ?  Thrombocytosis ?  ? ?Assessment and Plan: ?Bilateral lower lobe pneumonia possible aspiration ?Acute hypoxic respiratory failure  ?Acute exacerbation of asthma ?H/o Bronchiectasis: ?-Patient was seen at pulmonology office on 4/25 with worsening shortness of breath, cough in the setting of bronchiectasis. ?-Labs showed leukocytosis with chest x-ray showing progressive bibasilar pulm infiltrates.   ?-Blood culture did not show any growth.   ?-CT scan of chest on 4/30 showed dense right lower lobe pneumonia as well as multiple additional areas of patchy opacity and nodularity throughout both lungs  favored to represent multifocal pneumonia. ?-Since admission, patient was started on ceftriaxone, doxycycline and Flagyl.   ?-continue ICS/LAMA/LABA inhalers.  ?-Currently not requiring supplemental oxygen. ?-Patient usually sees Dr. Silas Flood in the office.  She was seen by Dr. Shearon Stalls on 4/25 before this presentation. ?  ?Nausea vomiting and diarrhea ?-Patient had the symptoms prior to presentation.  ?-Gradually improving.  Less diarrhea today than yesterday. ?-WBC count improving.  No fever. ?-Can continue IV Rocephin.  Stop Flagyl and doxycycline. ?-Repeat labs tomorrow. ?Recent Labs  ?Lab 01/20/22 ?0026 01/20/22 ?0336 01/21/22 ?9509 01/23/22 ?3267 01/23/22 ?0725 01/24/22 ?1245 01/25/22 ?8099  ?WBC 21.5*   < > 11.7* 12.0* 11.8* 18.6* 15.8*  ?LATICACIDVEN 1.1  --   --   --   --  1.3  --   ? < > = values in this interval not displayed.  ? ?hypokalemia/hypomagnesemia-replace ?-Potassium level much better after replacement yesterday.  Magnesium level normal. ?Recent Labs  ?Lab 01/20/22 ?0217 01/20/22 ?0336 01/21/22 ?8338 01/23/22 ?2505 01/24/22 ?3976 01/25/22 ?0419  ?K  --  4.2 3.8 2.9* 4.4 4.1  ?MG 1.7  --   --  2.1  --   --   ?  ?Hypertension ?-Okay to continue losartan.  Keep HCTZ on hold while having diarrhea. ?   ?GERD ?-continue PPI ?  ?Chronic compression fractures of L1, L2, and L3.  ?-Per radiologist, these appear grossly similar to previous lumbar spine radiographs from ?11/22/2021.  ? ?Goals of care ?  Code Status: DNR  ? ? ?Mobility: Encourage ambulation. ? ?Skin assessment:  ?  ? ?Nutritional status:  ?Body mass index is 23.41 kg/m?.  ?  ?  ? ? ? ? ?  Diet:  ?Diet Order   ? ?       ?  Diet regular Room service appropriate? Yes; Fluid consistency: Thin  Diet effective now       ?  ? ?  ?  ? ?  ? ? ?DVT prophylaxis:  ?enoxaparin (LOVENOX) injection 40 mg Start: 01/20/22 1000 ?  ?Antimicrobials: Rocephin ?Fluid: None currently ?Consultants: None ?Family Communication: Discussed with patient's daughters this  afternoon on the phone ? ?Status is: Inpatient ? ?Continue in-hospital care because: Diarrhea improving. ?Level of care: Telemetry  ? ?Dispo: The patient is from: Home ?             Anticipated d/c is to: PT recommend SNF.  Patient wants to go home with home health PT. ?             Patient currently is not medically stable to d/c. ?  Difficult to place patient No ? ? ? ? ?Infusions:  ? cefTRIAXone (ROCEPHIN)  IV Stopped (01/25/22 0254)  ? doxycycline (VIBRAMYCIN) IV Stopped (01/25/22 0533)  ? ? ?Scheduled Meds: ? aspirin EC  81 mg Oral Daily  ? enoxaparin (LOVENOX) injection  40 mg Subcutaneous Q24H  ? Fluticasone-Umeclidin-Vilant  1 puff Inhalation Daily  ? guaiFENesin  600 mg Oral BID  ? losartan  50 mg Oral Daily  ? mirtazapine  15 mg Oral QHS  ? pantoprazole  40 mg Oral BID  ? ? ?PRN meds: ?acetaminophen **OR** acetaminophen, diphenhydrAMINE **AND** acetaminophen, albuterol, diazepam  ? ?Antimicrobials: ?Anti-infectives (From admission, onward)  ? ? Start     Dose/Rate Route Frequency Ordered Stop  ? 01/21/22 0200  cefTRIAXone (ROCEPHIN) 1 g in sodium chloride 0.9 % 100 mL IVPB       ? 1 g ?200 mL/hr over 30 Minutes Intravenous Every 24 hours 01/20/22 0257    ? 01/20/22 1400  doxycycline (VIBRAMYCIN) 100 mg in sodium chloride 0.9 % 250 mL IVPB       ? 100 mg ?125 mL/hr over 120 Minutes Intravenous Every 12 hours 01/20/22 0345    ? 01/20/22 1200  doxycycline (VIBRA-TABS) tablet 100 mg  Status:  Discontinued       ? 100 mg Oral Every 12 hours 01/20/22 0257 01/20/22 0345  ? 01/20/22 0400  metroNIDAZOLE (FLAGYL) IVPB 500 mg  Status:  Discontinued       ? 500 mg ?100 mL/hr over 60 Minutes Intravenous Every 12 hours 01/20/22 0257 01/25/22 1125  ? 01/20/22 0130  cefTRIAXone (ROCEPHIN) 1 g in sodium chloride 0.9 % 100 mL IVPB       ? 1 g ?200 mL/hr over 30 Minutes Intravenous  Once 01/20/22 0129 01/20/22 0240  ? 01/20/22 0130  doxycycline (VIBRA-TABS) tablet 100 mg       ? 100 mg Oral  Once 01/20/22 0129 01/20/22 0211   ? ?  ? ? ?Objective: ?Vitals:  ? 01/25/22 0449 01/25/22 0815  ?BP: (!) 125/51   ?Pulse: 88   ?Resp: 18   ?Temp: 98.9 ?F (37.2 ?C)   ?SpO2: 94% 95%  ? ? ?Intake/Output Summary (Last 24 hours) at 01/25/2022 1346 ?Last data filed at 01/25/2022 1133 ?Gross per 24 hour  ?Intake 360 ml  ?Output --  ?Net 360 ml  ? ?Filed Weights  ? 01/21/22 2024  ?Weight: 56.2 kg  ? ?Weight change:  ?Body mass index is 23.41 kg/m?.  ? ?Physical Exam: ?General exam: Pleasant, elderly Caucasian female.  Sitting up in chair.  Not in distress ?  Skin: No rashes, lesions or ulcers. ?HEENT: Atraumatic, normocephalic, no obvious bleeding ?Lungs: Clear to auscultation bilaterally ?CVS: Regular rate and rhythm, no murmur ?GI/Abd soft, nontender, nondistended, bowel sound present ?CNS: Alert, awake, oriented x3 ?Psychiatry: Mood appropriate ?Extremities: No pedal edema, no calf tenderness ? ?Data Review: I have personally reviewed the laboratory data and studies available. ? ?F/u labs ordered ?Unresulted Labs (From admission, onward)  ? ? None  ? ?  ? ? ?Signed, ?Terrilee Croak, MD ?[facility] ?01/25/2022 ? ? ? ? ? ? ? ? ? ? ? ? ?

## 2022-01-25 NOTE — TOC Progression Note (Signed)
Transition of Care (TOC) - Progression Note  ? ? ?Patient Details  ?Name: Carla [name] ?MRN: [id number] ?Date of Birth: 1926-06-02 ? ?Transition of Care (TOC) CM/SW Contact  ?Purcell Mouton, RN ?Phone Number: ?01/25/2022, 11:56 AM ? ?Clinical Narrative:    ?Spoke with pt concerning PT recommendation for SNF. Pt states that she will go home with [facility], she do not want to go to SNF. A call was made to [name] who states that PT recommended SNF to pt. [name] who want pt to go to SNF. Explained to Carla [name] that according to state law, pt has the right to refuse to go to SNF.  [name] was selected for [facility] and referral given to in house rep.  ? ? ?Expected Discharge Plan: [place] ?Barriers to Discharge: No Barriers Identified ? ?Expected Discharge Plan and Services ?Expected Discharge Plan: [place] ?  ?  ?Post Acute Care Choice: Home Health, Durable Medical Equipment ?Living arrangements for the past 2 months: [place] ?                ?  ?  ?  ?  ?  ?  ?  ?  ?  ?  ? ? ?Social Determinants of Health (SDOH) Interventions ?  ? ?Readmission Risk Interventions ?   ? View : No data to display.  ?  ?  ?  ? ? ?

## 2022-01-26 DIAGNOSIS — M1990 Unspecified osteoarthritis, unspecified site: Secondary | ICD-10-CM | POA: Diagnosis not present

## 2022-01-26 DIAGNOSIS — J181 Lobar pneumonia, unspecified organism: Secondary | ICD-10-CM | POA: Diagnosis not present

## 2022-01-26 DIAGNOSIS — Z853 Personal history of malignant neoplasm of breast: Secondary | ICD-10-CM | POA: Diagnosis not present

## 2022-01-26 DIAGNOSIS — F5104 Psychophysiologic insomnia: Secondary | ICD-10-CM | POA: Diagnosis not present

## 2022-01-26 DIAGNOSIS — R5381 Other malaise: Secondary | ICD-10-CM | POA: Diagnosis not present

## 2022-01-26 DIAGNOSIS — R279 Unspecified lack of coordination: Secondary | ICD-10-CM | POA: Diagnosis not present

## 2022-01-26 DIAGNOSIS — J309 Allergic rhinitis, unspecified: Secondary | ICD-10-CM | POA: Diagnosis not present

## 2022-01-26 DIAGNOSIS — G8929 Other chronic pain: Secondary | ICD-10-CM | POA: Diagnosis not present

## 2022-01-26 DIAGNOSIS — Z743 Need for continuous supervision: Secondary | ICD-10-CM | POA: Diagnosis not present

## 2022-01-26 DIAGNOSIS — M545 Low back pain, unspecified: Secondary | ICD-10-CM | POA: Diagnosis not present

## 2022-01-26 DIAGNOSIS — M5416 Radiculopathy, lumbar region: Secondary | ICD-10-CM | POA: Diagnosis not present

## 2022-01-26 DIAGNOSIS — E46 Unspecified protein-calorie malnutrition: Secondary | ICD-10-CM | POA: Diagnosis not present

## 2022-01-26 DIAGNOSIS — J690 Pneumonitis due to inhalation of food and vomit: Secondary | ICD-10-CM | POA: Diagnosis not present

## 2022-01-26 DIAGNOSIS — I10 Essential (primary) hypertension: Secondary | ICD-10-CM | POA: Diagnosis not present

## 2022-01-26 DIAGNOSIS — H3530 Unspecified macular degeneration: Secondary | ICD-10-CM | POA: Diagnosis not present

## 2022-01-26 DIAGNOSIS — C4491 Basal cell carcinoma of skin, unspecified: Secondary | ICD-10-CM | POA: Diagnosis not present

## 2022-01-26 DIAGNOSIS — J45909 Unspecified asthma, uncomplicated: Secondary | ICD-10-CM | POA: Diagnosis not present

## 2022-01-26 DIAGNOSIS — R2681 Unsteadiness on feet: Secondary | ICD-10-CM | POA: Diagnosis not present

## 2022-01-26 DIAGNOSIS — J9601 Acute respiratory failure with hypoxia: Secondary | ICD-10-CM | POA: Diagnosis not present

## 2022-01-26 DIAGNOSIS — D75839 Thrombocytosis, unspecified: Secondary | ICD-10-CM | POA: Diagnosis not present

## 2022-01-26 DIAGNOSIS — R0602 Shortness of breath: Secondary | ICD-10-CM | POA: Diagnosis not present

## 2022-01-26 DIAGNOSIS — J479 Bronchiectasis, uncomplicated: Secondary | ICD-10-CM | POA: Diagnosis not present

## 2022-01-26 DIAGNOSIS — A310 Pulmonary mycobacterial infection: Secondary | ICD-10-CM | POA: Diagnosis not present

## 2022-01-26 DIAGNOSIS — D649 Anemia, unspecified: Secondary | ICD-10-CM | POA: Diagnosis not present

## 2022-01-26 DIAGNOSIS — K219 Gastro-esophageal reflux disease without esophagitis: Secondary | ICD-10-CM | POA: Diagnosis not present

## 2022-01-26 DIAGNOSIS — J189 Pneumonia, unspecified organism: Secondary | ICD-10-CM | POA: Diagnosis not present

## 2022-01-26 DIAGNOSIS — B354 Tinea corporis: Secondary | ICD-10-CM | POA: Diagnosis not present

## 2022-01-26 DIAGNOSIS — F419 Anxiety disorder, unspecified: Secondary | ICD-10-CM | POA: Diagnosis not present

## 2022-01-26 LAB — CBC
HCT: 32.5 % — ABNORMAL LOW (ref 36.0–46.0)
Hemoglobin: 10.4 g/dL — ABNORMAL LOW (ref 12.0–15.0)
MCH: 35.3 pg — ABNORMAL HIGH (ref 26.0–34.0)
MCHC: 32 g/dL (ref 30.0–36.0)
MCV: 110.2 fL — ABNORMAL HIGH (ref 80.0–100.0)
Platelets: 413 10*3/uL — ABNORMAL HIGH (ref 150–400)
RBC: 2.95 MIL/uL — ABNORMAL LOW (ref 3.87–5.11)
RDW: 13.7 % (ref 11.5–15.5)
WBC: 11.4 10*3/uL — ABNORMAL HIGH (ref 4.0–10.5)
nRBC: 0 % (ref 0.0–0.2)

## 2022-01-26 MED ORDER — LOSARTAN POTASSIUM 50 MG PO TABS: 50.0000 mg | ORAL_TABLET | Freq: Every day | ORAL | Status: AC

## 2022-01-26 MED ORDER — DIAZEPAM 5 MG PO TABS: 5.0000 mg | ORAL_TABLET | Freq: Every evening | ORAL | 0 refills | Status: AC | PRN

## 2022-01-26 MED ORDER — DIPHENHYDRAMINE-APAP (SLEEP) 25-500 MG PO TABS: 1.0000 | ORAL_TABLET | Freq: Every day | ORAL | 0 refills | Status: DC

## 2022-01-26 MED ORDER — GUAIFENESIN ER 600 MG PO TB12: 600.0000 mg | ORAL_TABLET | Freq: Two times a day (BID) | ORAL | Status: DC

## 2022-01-26 NOTE — Discharge Summary (Signed)
? ?Physician Discharge Summary  ?Carla [name]:[id number] DOB: September 28, 1925 DOA: 01/20/2022 ? ?PCP: [name], MD ? ?Admit date: 01/20/2022 ?Discharge date: 01/26/2022 ? ?Admitted From: Home ?Discharge disposition: SNF ? ?Recommendations at discharge:  ?You completed the course of antibiotics in the hospital.  No antibiotics needed at discharge. ?Monitor blood pressure medicines hydrochlorothiazide has been stopped ? ? ?Brief narrative: ?Carla [name] is a 88 y.o. female with PMH significant for bronchiectasis, MAI infection not on therapy, asthma, history of breast cancer, basal cell skin cancer, hypertension, lumbar radiculopathy. ?Patient was seen at pulmonology office on 01/18/2022 for worsening shortness of breath and cough, was started on Augmentin due to concern for acute exacerbation of bronchiectasis subsequently seen in the ED with fever shortness of breath cough vomiting and diarrhea. ?In the ED, labs showed elevated WBC count of 21.5, ?Chest x-ray showed progressive bibasilar pulmonary infiltrate concerning for infection or aspiration. ?Urinalysis showed positive nitrite and leukocyte esterase. ?Blood culture was sent. ?Patient was admitted to hospitalist service ? ?Subjective: ?Patient was seen and examined this morning.  ?Sitting up in chair.  Not in distress.  Not on supplemental oxygen at rest.  Low-flow oxygen during the night.  ?Patient states diarrhea is improving.  No fever.  WBC count improving.  ?PT recommended SNF but patient wants to go home when stable. ? ?Principal Problem: ?  Pneumonia ?Active Problems: ?  HTN (hypertension) ?  Esophageal reflux ?  Asthma ?  Anemia ?  Thrombocytosis ?  ? ?Hospital course: ?Bilateral lower lobe pneumonia possible aspiration ?Acute hypoxic respiratory failure  ?Acute exacerbation of asthma ?H/o Bronchiectasis: ?-Patient was seen at pulmonology office on 4/25 with worsening shortness of breath, cough in the setting of bronchiectasis. ?-Labs showed  leukocytosis with chest x-ray showing progressive bibasilar pulm infiltrates.   ?-Patient was started on broad-spectrum antibiotics.  Blood cultures sent which did not show any growth. ?-CT scan of chest repeated on 4/30 showed dense right lower lobe pneumonia as well as multiple additional areas of patchy opacity and nodularity throughout both lungs favored to represent multifocal pneumonia. ?-Patient however is clinically improving on antibiotics.  She completed a 7-day course of antibiotics on 5/3. ?-continue ICS/LAMA/LABA inhalers.  ?-Currently not requiring supplemental oxygen. ?-Patient usually sees Dr. Silas Flood in the office.  She was seen by Dr. Shearon Stalls on 4/25 before this presentation. ?  ?Nausea vomiting and diarrhea ?-Patient had the symptoms prior to presentation.  ?-Symptoms gradually improved. ?-WBC count improving.  No fever. ?Recent Labs  ?Lab 01/20/22 ?0026 01/20/22 ?0336 01/23/22 ?8841 01/23/22 ?0725 01/24/22 ?6606 01/25/22 ?3016 01/26/22 ?0109  ?WBC 21.5*   < > 12.0* 11.8* 18.6* 15.8* 11.4*  ?LATICACIDVEN 1.1  --   --   --  1.3  --   --   ? < > = values in this interval not displayed.  ? ?hypokalemia/hypomagnesemia ?-Potassium level much better after replacement yesterday.  Magnesium level normal. ?Recent Labs  ?Lab 01/20/22 ?0217 01/20/22 ?0336 01/21/22 ?3235 01/23/22 ?5732 01/24/22 ?2025 01/25/22 ?0419  ?K  --  4.2 3.8 2.9* 4.4 4.1  ?MG 1.7  --   --  2.1  --   --   ?  ?Hypertension ?-Okay to continue losartan.  HCTZ on hold.  Blood pressure stable without it.  I would keep it on hold at discharge. ?   ?GERD ?-continue PPI ?  ?Chronic compression fractures of L1, L2, and L3.  ?-Per radiologist, these appear grossly similar to previous lumbar spine radiographs from ?11/22/2021.  ? ?  Goals of care ?  Code Status: DNR  ? ? ?Mobility: Encourage ambulation. ? ?Skin assessment:  ?  ? ?Nutritional status:  ?Body mass index is 23.41 kg/m?.  ?  ?  ? ? ? ? ?Wounds:  ?- ?Incision (Closed) 05/03/16 Breast  Right (Active)  ?Date First Assessed/Time First Assessed: 05/03/16 0847   Location: Breast  Location Orientation: Right  ?  ?Assessments 05/03/2016  9:32 AM 05/03/2016  9:49 AM  ?Dressing Type Liquid skin adhesive Liquid skin adhesive  ?Dressing -- Clean;Dry;Intact  ?Site / Wound Assessment -- Clean;Dry  ?Margins -- Attached edges (approximated)  ?Closure -- Skin glue  ?Drainage Amount None None  ?   ?No Linked orders to display  ? ? ?Discharge Exam:  ? ?Vitals:  ? 01/24/22 2057 01/25/22 0449 01/25/22 0815 01/25/22 2201  ?BP: (!) 117/58 (!) 125/51  (!) 143/70  ?Pulse: 92 88  (!) 103  ?Resp: '20 18  20  '$ ?Temp: 98.5 ?F (36.9 ?C) 98.9 ?F (37.2 ?C)  98.7 ?F (37.1 ?C)  ?TempSrc: Oral Oral  Oral  ?SpO2: 93% 94% 95% 95%  ?Weight:      ?Height:      ? ? ?Body mass index is 23.41 kg/m?.  ? ?General exam: Pleasant, elderly Caucasian female.  Sitting up in chair.  Not in distress ?Skin: No rashes, lesions or ulcers. ?HEENT: Atraumatic, normocephalic, no obvious bleeding ?Lungs: Clear to auscultation bilaterally ?CVS: Regular rate and rhythm, no murmur ?GI/Abd soft, nontender, nondistended, bowel sound present ?CNS: Alert, awake, oriented x3 ?Psychiatry: Mood appropriate ?Extremities: No pedal edema, no calf tenderness ? ?Follow ups:  ? ? Follow-up Information   ? ? Care, [facility] Follow up.   ?Specialty: Home Health Services ?Why: [name] will call you before coming to visit. ?Contact information: ?[address] ?[address] ?[place] [address] ?[phone] ? ? ?  ?  ? ? [name], MD Follow up.   ?Specialty: Internal Medicine ?Contact information: ?[address] ?[place] [address] ?[phone] ? ? ?  ?  ? ?  ?  ? ?  ? ? ?Discharge Instructions:  ? ?Discharge Instructions   ? ? Call MD for:  difficulty breathing, headache or visual disturbances   Complete by: As directed ?  ? Call MD for:  extreme fatigue   Complete by: As directed ?  ? Call MD for:  hives   Complete by: As directed ?  ? Call MD for:   persistant dizziness or light-headedness   Complete by: As directed ?  ? Call MD for:  persistant nausea and vomiting   Complete by: As directed ?  ? Call MD for:  severe uncontrolled pain   Complete by: As directed ?  ? Call MD for:  temperature >100.4   Complete by: As directed ?  ? Diet general   Complete by: As directed ?  ? Discharge instructions   Complete by: As directed ?  ? Recommendations at discharge:  ?? You completed the course of antibiotics in the hospital.  No antibiotics needed at discharge. ?? Monitor blood pressure medicines hydrochlorothiazide has been stopped ? ? ?General discharge instructions: ?Follow with Primary MD [name], MD in 7 days  ?Please request your PCP  to go over your hospital tests, procedures, radiology results at the follow up. Please get your medicines reviewed and adjusted.  Your PCP may decide to repeat certain labs or tests as needed. ?Do not drive, operate heavy machinery, perform activities at heights, swimming  or participation in water activities or provide baby sitting services if your were admitted for syncope or siezures until you have seen by Primary MD or a Neurologist and advised to do so again. ?[place] Controlled Substance Reporting System database was reviewed. Do not drive, operate heavy machinery, perform activities at heights, swim, participate in water activities or provide baby-sitting services while on medications for pain, sleep and mood until your outpatient physician has reevaluated you and advised to do so again.  You are strongly recommended to comply with the dose, frequency and duration of prescribed medications. ?Activity: As tolerated with Full fall precautions use walker/cane & assistance as needed ?Avoid using any recreational substances like cigarette, tobacco, alcohol, or non-prescribed drug. ?If you experience worsening of your admission symptoms, develop shortness of breath, life threatening emergency, suicidal or homicidal  thoughts you must seek medical attention immediately by calling 911 or calling your MD immediately  if symptoms less severe. ?You must read complete instructions/literature along with all the possible adverse reactions/s

## 2022-01-26 NOTE — TOC Progression Note (Signed)
Transition of Care (TOC) - Progression Note  ? ? ?Patient Details  ?Name: Carla [name] ?MRN: [id number] ?Date of Birth: 04/09/1926 ? ?Transition of Care (TOC) CM/SW Contact  ?Purcell Mouton, RN ?Phone Number: ?01/26/2022, 12:46 PM ? ?Clinical Narrative:    ?Pt's daughter Thayer Headings was called and pt was made aware of transfer to [facility] today. [facility] was called.  ? ? ?Expected Discharge Plan: [place] ?Barriers to Discharge: No Barriers Identified ? ?Expected Discharge Plan and Services ?Expected Discharge Plan: [place] ?  ?  ?Post Acute Care Choice: Home Health, Durable Medical Equipment ?Living arrangements for the past 2 months: [place] ?Expected Discharge Date: 01/26/22               ?  ?  ?  ?  ?  ?  ?  ?  ?  ?  ? ? ?Social Determinants of Health (SDOH) Interventions ?  ? ?Readmission Risk Interventions ?   ? View : No data to display.  ?  ?  ?  ? ? ?

## 2022-01-26 NOTE — Progress Notes (Signed)
Report called to Caryl Pina, RN at [facility] SN[facility]. ? ?

## 2022-01-27 ENCOUNTER — Encounter: Payer: Medicare Other | Admitting: Physical Medicine and Rehabilitation

## 2022-01-27 LAB — CULTURE, BLOOD (ROUTINE X 2)
Culture: NO GROWTH
Culture: NO GROWTH
Special Requests: ADEQUATE
Special Requests: ADEQUATE

## 2022-01-30 DIAGNOSIS — E46 Unspecified protein-calorie malnutrition: Secondary | ICD-10-CM | POA: Diagnosis not present

## 2022-01-30 DIAGNOSIS — D649 Anemia, unspecified: Secondary | ICD-10-CM | POA: Diagnosis not present

## 2022-01-30 DIAGNOSIS — B354 Tinea corporis: Secondary | ICD-10-CM | POA: Diagnosis not present

## 2022-01-30 DIAGNOSIS — J479 Bronchiectasis, uncomplicated: Secondary | ICD-10-CM | POA: Diagnosis not present

## 2022-01-30 DIAGNOSIS — J189 Pneumonia, unspecified organism: Secondary | ICD-10-CM | POA: Diagnosis not present

## 2022-01-30 DIAGNOSIS — G8929 Other chronic pain: Secondary | ICD-10-CM | POA: Diagnosis not present

## 2022-01-30 DIAGNOSIS — M5416 Radiculopathy, lumbar region: Secondary | ICD-10-CM | POA: Diagnosis not present

## 2022-02-01 ENCOUNTER — Other Ambulatory Visit: Payer: Self-pay | Admitting: *Deleted

## 2022-02-01 NOTE — Patient Outreach (Signed)
Per [place] eligible member currently resides in [facility] SNF.  Screening for potential [facility] Care Management services as a benefit of member's insurance plan. ? ?Ms. Leer admitted to SNF on 01/26/22 after hospitalization. ? ?Update received from [place], [place] SW indicating transition plan is to return home. States daughters are very involved in Mrs. Carmer's care in terms of managing medications, meals, and etc. States primary contact is daughter/DPR Hal Neer regarding [place] Management discussion.  ? ?Will continue to follow. Will plan outreach to daughter to discuss potential [facility] Care Management needs.  ? ?Marthenia Rolling, MSN, RN,BSN ?[place] Coordinator ?[phone] [facility]) ?[phone]  (Toll free office)  ? ?

## 2022-02-07 DIAGNOSIS — Z961 Presence of intraocular lens: Secondary | ICD-10-CM | POA: Diagnosis not present

## 2022-02-07 DIAGNOSIS — J470 Bronchiectasis with acute lower respiratory infection: Secondary | ICD-10-CM | POA: Diagnosis not present

## 2022-02-07 DIAGNOSIS — M1990 Unspecified osteoarthritis, unspecified site: Secondary | ICD-10-CM | POA: Diagnosis not present

## 2022-02-07 DIAGNOSIS — Z7951 Long term (current) use of inhaled steroids: Secondary | ICD-10-CM | POA: Diagnosis not present

## 2022-02-07 DIAGNOSIS — D649 Anemia, unspecified: Secondary | ICD-10-CM | POA: Diagnosis not present

## 2022-02-07 DIAGNOSIS — G8929 Other chronic pain: Secondary | ICD-10-CM | POA: Diagnosis not present

## 2022-02-07 DIAGNOSIS — Z853 Personal history of malignant neoplasm of breast: Secondary | ICD-10-CM | POA: Diagnosis not present

## 2022-02-07 DIAGNOSIS — Z96653 Presence of artificial knee joint, bilateral: Secondary | ICD-10-CM | POA: Diagnosis not present

## 2022-02-07 DIAGNOSIS — A310 Pulmonary mycobacterial infection: Secondary | ICD-10-CM | POA: Diagnosis not present

## 2022-02-07 DIAGNOSIS — H9190 Unspecified hearing loss, unspecified ear: Secondary | ICD-10-CM | POA: Diagnosis not present

## 2022-02-07 DIAGNOSIS — J45901 Unspecified asthma with (acute) exacerbation: Secondary | ICD-10-CM | POA: Diagnosis not present

## 2022-02-07 DIAGNOSIS — J479 Bronchiectasis, uncomplicated: Secondary | ICD-10-CM | POA: Diagnosis not present

## 2022-02-07 DIAGNOSIS — M4856XD Collapsed vertebra, not elsewhere classified, lumbar region, subsequent encounter for fracture with routine healing: Secondary | ICD-10-CM | POA: Diagnosis not present

## 2022-02-07 DIAGNOSIS — J189 Pneumonia, unspecified organism: Secondary | ICD-10-CM | POA: Diagnosis not present

## 2022-02-07 DIAGNOSIS — E441 Mild protein-calorie malnutrition: Secondary | ICD-10-CM | POA: Diagnosis not present

## 2022-02-07 DIAGNOSIS — H3530 Unspecified macular degeneration: Secondary | ICD-10-CM | POA: Diagnosis not present

## 2022-02-07 DIAGNOSIS — F5104 Psychophysiologic insomnia: Secondary | ICD-10-CM | POA: Diagnosis not present

## 2022-02-07 DIAGNOSIS — I10 Essential (primary) hypertension: Secondary | ICD-10-CM | POA: Diagnosis not present

## 2022-02-07 DIAGNOSIS — M5416 Radiculopathy, lumbar region: Secondary | ICD-10-CM | POA: Diagnosis not present

## 2022-02-07 DIAGNOSIS — M545 Low back pain, unspecified: Secondary | ICD-10-CM | POA: Diagnosis not present

## 2022-02-07 DIAGNOSIS — E876 Hypokalemia: Secondary | ICD-10-CM | POA: Diagnosis not present

## 2022-02-07 DIAGNOSIS — B354 Tinea corporis: Secondary | ICD-10-CM | POA: Diagnosis not present

## 2022-02-07 DIAGNOSIS — D75839 Thrombocytosis, unspecified: Secondary | ICD-10-CM | POA: Diagnosis not present

## 2022-02-07 DIAGNOSIS — J9601 Acute respiratory failure with hypoxia: Secondary | ICD-10-CM | POA: Diagnosis not present

## 2022-02-07 DIAGNOSIS — F418 Other specified anxiety disorders: Secondary | ICD-10-CM | POA: Diagnosis not present

## 2022-02-07 DIAGNOSIS — K219 Gastro-esophageal reflux disease without esophagitis: Secondary | ICD-10-CM | POA: Diagnosis not present

## 2022-02-08 DIAGNOSIS — I10 Essential (primary) hypertension: Secondary | ICD-10-CM | POA: Diagnosis not present

## 2022-02-08 DIAGNOSIS — J9601 Acute respiratory failure with hypoxia: Secondary | ICD-10-CM | POA: Diagnosis not present

## 2022-02-08 DIAGNOSIS — J45901 Unspecified asthma with (acute) exacerbation: Secondary | ICD-10-CM | POA: Diagnosis not present

## 2022-02-08 DIAGNOSIS — J189 Pneumonia, unspecified organism: Secondary | ICD-10-CM | POA: Diagnosis not present

## 2022-02-08 DIAGNOSIS — A310 Pulmonary mycobacterial infection: Secondary | ICD-10-CM | POA: Diagnosis not present

## 2022-02-08 DIAGNOSIS — J470 Bronchiectasis with acute lower respiratory infection: Secondary | ICD-10-CM | POA: Diagnosis not present

## 2022-02-09 DIAGNOSIS — H04123 Dry eye syndrome of bilateral lacrimal glands: Secondary | ICD-10-CM | POA: Diagnosis not present

## 2022-02-09 DIAGNOSIS — H353133 Nonexudative age-related macular degeneration, bilateral, advanced atrophic without subfoveal involvement: Secondary | ICD-10-CM | POA: Diagnosis not present

## 2022-02-09 DIAGNOSIS — H43813 Vitreous degeneration, bilateral: Secondary | ICD-10-CM | POA: Diagnosis not present

## 2022-02-09 DIAGNOSIS — H524 Presbyopia: Secondary | ICD-10-CM | POA: Diagnosis not present

## 2022-02-10 DIAGNOSIS — J9601 Acute respiratory failure with hypoxia: Secondary | ICD-10-CM | POA: Diagnosis not present

## 2022-02-10 DIAGNOSIS — J189 Pneumonia, unspecified organism: Secondary | ICD-10-CM | POA: Diagnosis not present

## 2022-02-10 DIAGNOSIS — J45901 Unspecified asthma with (acute) exacerbation: Secondary | ICD-10-CM | POA: Diagnosis not present

## 2022-02-10 DIAGNOSIS — A310 Pulmonary mycobacterial infection: Secondary | ICD-10-CM | POA: Diagnosis not present

## 2022-02-10 DIAGNOSIS — J470 Bronchiectasis with acute lower respiratory infection: Secondary | ICD-10-CM | POA: Diagnosis not present

## 2022-02-10 DIAGNOSIS — I10 Essential (primary) hypertension: Secondary | ICD-10-CM | POA: Diagnosis not present

## 2022-02-15 DIAGNOSIS — I10 Essential (primary) hypertension: Secondary | ICD-10-CM | POA: Diagnosis not present

## 2022-02-15 DIAGNOSIS — R197 Diarrhea, unspecified: Secondary | ICD-10-CM | POA: Diagnosis not present

## 2022-02-15 DIAGNOSIS — J189 Pneumonia, unspecified organism: Secondary | ICD-10-CM | POA: Diagnosis not present

## 2022-02-15 DIAGNOSIS — D75839 Thrombocytosis, unspecified: Secondary | ICD-10-CM | POA: Diagnosis not present

## 2022-02-15 DIAGNOSIS — K219 Gastro-esophageal reflux disease without esophagitis: Secondary | ICD-10-CM | POA: Diagnosis not present

## 2022-02-15 DIAGNOSIS — D649 Anemia, unspecified: Secondary | ICD-10-CM | POA: Diagnosis not present

## 2022-02-15 DIAGNOSIS — E46 Unspecified protein-calorie malnutrition: Secondary | ICD-10-CM | POA: Diagnosis not present

## 2022-02-15 DIAGNOSIS — S32010A Wedge compression fracture of first lumbar vertebra, initial encounter for closed fracture: Secondary | ICD-10-CM | POA: Diagnosis not present

## 2022-02-15 DIAGNOSIS — M1990 Unspecified osteoarthritis, unspecified site: Secondary | ICD-10-CM | POA: Diagnosis not present

## 2022-02-15 DIAGNOSIS — J479 Bronchiectasis, uncomplicated: Secondary | ICD-10-CM | POA: Diagnosis not present

## 2022-02-15 DIAGNOSIS — Z853 Personal history of malignant neoplasm of breast: Secondary | ICD-10-CM | POA: Diagnosis not present

## 2022-02-15 DIAGNOSIS — M5416 Radiculopathy, lumbar region: Secondary | ICD-10-CM | POA: Diagnosis not present

## 2022-02-16 DIAGNOSIS — J189 Pneumonia, unspecified organism: Secondary | ICD-10-CM | POA: Diagnosis not present

## 2022-02-16 DIAGNOSIS — J470 Bronchiectasis with acute lower respiratory infection: Secondary | ICD-10-CM | POA: Diagnosis not present

## 2022-02-16 DIAGNOSIS — I10 Essential (primary) hypertension: Secondary | ICD-10-CM | POA: Diagnosis not present

## 2022-02-16 DIAGNOSIS — A310 Pulmonary mycobacterial infection: Secondary | ICD-10-CM | POA: Diagnosis not present

## 2022-02-16 DIAGNOSIS — J9601 Acute respiratory failure with hypoxia: Secondary | ICD-10-CM | POA: Diagnosis not present

## 2022-02-16 DIAGNOSIS — J45901 Unspecified asthma with (acute) exacerbation: Secondary | ICD-10-CM | POA: Diagnosis not present

## 2022-02-17 ENCOUNTER — Telehealth: Payer: Self-pay | Admitting: Physical Medicine and Rehabilitation

## 2022-02-17 DIAGNOSIS — I10 Essential (primary) hypertension: Secondary | ICD-10-CM | POA: Diagnosis not present

## 2022-02-17 DIAGNOSIS — A310 Pulmonary mycobacterial infection: Secondary | ICD-10-CM | POA: Diagnosis not present

## 2022-02-17 DIAGNOSIS — J9601 Acute respiratory failure with hypoxia: Secondary | ICD-10-CM | POA: Diagnosis not present

## 2022-02-17 DIAGNOSIS — J189 Pneumonia, unspecified organism: Secondary | ICD-10-CM | POA: Diagnosis not present

## 2022-02-17 DIAGNOSIS — J470 Bronchiectasis with acute lower respiratory infection: Secondary | ICD-10-CM | POA: Diagnosis not present

## 2022-02-17 DIAGNOSIS — J45901 Unspecified asthma with (acute) exacerbation: Secondary | ICD-10-CM | POA: Diagnosis not present

## 2022-02-18 DIAGNOSIS — J470 Bronchiectasis with acute lower respiratory infection: Secondary | ICD-10-CM | POA: Diagnosis not present

## 2022-02-18 DIAGNOSIS — J45901 Unspecified asthma with (acute) exacerbation: Secondary | ICD-10-CM | POA: Diagnosis not present

## 2022-02-18 DIAGNOSIS — I10 Essential (primary) hypertension: Secondary | ICD-10-CM | POA: Diagnosis not present

## 2022-02-18 DIAGNOSIS — A310 Pulmonary mycobacterial infection: Secondary | ICD-10-CM | POA: Diagnosis not present

## 2022-02-18 DIAGNOSIS — J189 Pneumonia, unspecified organism: Secondary | ICD-10-CM | POA: Diagnosis not present

## 2022-02-18 DIAGNOSIS — J9601 Acute respiratory failure with hypoxia: Secondary | ICD-10-CM | POA: Diagnosis not present

## 2022-02-22 DIAGNOSIS — I10 Essential (primary) hypertension: Secondary | ICD-10-CM | POA: Diagnosis not present

## 2022-02-22 DIAGNOSIS — J45901 Unspecified asthma with (acute) exacerbation: Secondary | ICD-10-CM | POA: Diagnosis not present

## 2022-02-22 DIAGNOSIS — J189 Pneumonia, unspecified organism: Secondary | ICD-10-CM | POA: Diagnosis not present

## 2022-02-22 DIAGNOSIS — A310 Pulmonary mycobacterial infection: Secondary | ICD-10-CM | POA: Diagnosis not present

## 2022-02-22 DIAGNOSIS — J9601 Acute respiratory failure with hypoxia: Secondary | ICD-10-CM | POA: Diagnosis not present

## 2022-02-22 DIAGNOSIS — J470 Bronchiectasis with acute lower respiratory infection: Secondary | ICD-10-CM | POA: Diagnosis not present

## 2022-02-23 ENCOUNTER — Ambulatory Visit: Payer: Medicare Other | Admitting: Pulmonary Disease

## 2022-02-23 DIAGNOSIS — I10 Essential (primary) hypertension: Secondary | ICD-10-CM | POA: Diagnosis not present

## 2022-02-23 DIAGNOSIS — A310 Pulmonary mycobacterial infection: Secondary | ICD-10-CM | POA: Diagnosis not present

## 2022-02-23 DIAGNOSIS — J470 Bronchiectasis with acute lower respiratory infection: Secondary | ICD-10-CM | POA: Diagnosis not present

## 2022-02-23 DIAGNOSIS — J45901 Unspecified asthma with (acute) exacerbation: Secondary | ICD-10-CM | POA: Diagnosis not present

## 2022-02-23 DIAGNOSIS — J9601 Acute respiratory failure with hypoxia: Secondary | ICD-10-CM | POA: Diagnosis not present

## 2022-02-23 DIAGNOSIS — J189 Pneumonia, unspecified organism: Secondary | ICD-10-CM | POA: Diagnosis not present

## 2022-02-24 ENCOUNTER — Ambulatory Visit (INDEPENDENT_AMBULATORY_CARE_PROVIDER_SITE_OTHER): Payer: Medicare Other | Admitting: Physical Medicine and Rehabilitation

## 2022-02-24 ENCOUNTER — Ambulatory Visit: Payer: Self-pay

## 2022-02-24 VITALS — BP 122/71 | HR 40

## 2022-02-24 DIAGNOSIS — M5416 Radiculopathy, lumbar region: Secondary | ICD-10-CM | POA: Diagnosis not present

## 2022-02-24 MED ORDER — METHYLPREDNISOLONE ACETATE 80 MG/ML IJ SUSP
80.0000 mg | Freq: Once | INTRAMUSCULAR | Status: AC
  Administered 2022-02-24: 80 mg

## 2022-02-24 NOTE — Patient Instructions (Signed)

## 2022-02-24 NOTE — Progress Notes (Signed)
 .  Numeric Pain Rating Scale and Functional Assessment Average Pain 8   In the last MONTH (on 0-10 scale) has pain interfered with the following?  1. General activity like being  able to carry out your everyday physical activities such as walking, climbing stairs, carrying groceries, or moving a chair?  Rating(7)   +Driver, -BT, -Dye Allergies.  

## 2022-02-25 DIAGNOSIS — A310 Pulmonary mycobacterial infection: Secondary | ICD-10-CM | POA: Diagnosis not present

## 2022-02-25 DIAGNOSIS — J9601 Acute respiratory failure with hypoxia: Secondary | ICD-10-CM | POA: Diagnosis not present

## 2022-02-25 DIAGNOSIS — J189 Pneumonia, unspecified organism: Secondary | ICD-10-CM | POA: Diagnosis not present

## 2022-02-25 DIAGNOSIS — J470 Bronchiectasis with acute lower respiratory infection: Secondary | ICD-10-CM | POA: Diagnosis not present

## 2022-02-25 DIAGNOSIS — J45901 Unspecified asthma with (acute) exacerbation: Secondary | ICD-10-CM | POA: Diagnosis not present

## 2022-02-25 DIAGNOSIS — I10 Essential (primary) hypertension: Secondary | ICD-10-CM | POA: Diagnosis not present

## 2022-02-28 DIAGNOSIS — A310 Pulmonary mycobacterial infection: Secondary | ICD-10-CM | POA: Diagnosis not present

## 2022-02-28 DIAGNOSIS — J9601 Acute respiratory failure with hypoxia: Secondary | ICD-10-CM | POA: Diagnosis not present

## 2022-02-28 DIAGNOSIS — I10 Essential (primary) hypertension: Secondary | ICD-10-CM | POA: Diagnosis not present

## 2022-02-28 DIAGNOSIS — J189 Pneumonia, unspecified organism: Secondary | ICD-10-CM | POA: Diagnosis not present

## 2022-02-28 DIAGNOSIS — J45901 Unspecified asthma with (acute) exacerbation: Secondary | ICD-10-CM | POA: Diagnosis not present

## 2022-02-28 DIAGNOSIS — J470 Bronchiectasis with acute lower respiratory infection: Secondary | ICD-10-CM | POA: Diagnosis not present

## 2022-03-03 DIAGNOSIS — J189 Pneumonia, unspecified organism: Secondary | ICD-10-CM | POA: Diagnosis not present

## 2022-03-03 DIAGNOSIS — J470 Bronchiectasis with acute lower respiratory infection: Secondary | ICD-10-CM | POA: Diagnosis not present

## 2022-03-03 DIAGNOSIS — J45901 Unspecified asthma with (acute) exacerbation: Secondary | ICD-10-CM | POA: Diagnosis not present

## 2022-03-03 DIAGNOSIS — I10 Essential (primary) hypertension: Secondary | ICD-10-CM | POA: Diagnosis not present

## 2022-03-03 DIAGNOSIS — J9601 Acute respiratory failure with hypoxia: Secondary | ICD-10-CM | POA: Diagnosis not present

## 2022-03-03 DIAGNOSIS — A310 Pulmonary mycobacterial infection: Secondary | ICD-10-CM | POA: Diagnosis not present

## 2022-03-04 DIAGNOSIS — J189 Pneumonia, unspecified organism: Secondary | ICD-10-CM | POA: Diagnosis not present

## 2022-03-04 DIAGNOSIS — A310 Pulmonary mycobacterial infection: Secondary | ICD-10-CM | POA: Diagnosis not present

## 2022-03-04 DIAGNOSIS — J45901 Unspecified asthma with (acute) exacerbation: Secondary | ICD-10-CM | POA: Diagnosis not present

## 2022-03-04 DIAGNOSIS — J470 Bronchiectasis with acute lower respiratory infection: Secondary | ICD-10-CM | POA: Diagnosis not present

## 2022-03-04 DIAGNOSIS — I10 Essential (primary) hypertension: Secondary | ICD-10-CM | POA: Diagnosis not present

## 2022-03-04 DIAGNOSIS — J9601 Acute respiratory failure with hypoxia: Secondary | ICD-10-CM | POA: Diagnosis not present

## 2022-03-09 ENCOUNTER — Ambulatory Visit (INDEPENDENT_AMBULATORY_CARE_PROVIDER_SITE_OTHER): Payer: Medicare Other | Admitting: Pulmonary Disease

## 2022-03-09 ENCOUNTER — Encounter: Payer: Self-pay | Admitting: Pulmonary Disease

## 2022-03-09 VITALS — BP 118/62 | HR 54 | Temp 98.6°F | Ht 60.0 in | Wt 123.0 lb

## 2022-03-09 DIAGNOSIS — Z961 Presence of intraocular lens: Secondary | ICD-10-CM | POA: Diagnosis not present

## 2022-03-09 DIAGNOSIS — D649 Anemia, unspecified: Secondary | ICD-10-CM | POA: Diagnosis not present

## 2022-03-09 DIAGNOSIS — Z96653 Presence of artificial knee joint, bilateral: Secondary | ICD-10-CM | POA: Diagnosis not present

## 2022-03-09 DIAGNOSIS — Z7951 Long term (current) use of inhaled steroids: Secondary | ICD-10-CM | POA: Diagnosis not present

## 2022-03-09 DIAGNOSIS — J189 Pneumonia, unspecified organism: Secondary | ICD-10-CM | POA: Diagnosis not present

## 2022-03-09 DIAGNOSIS — H9190 Unspecified hearing loss, unspecified ear: Secondary | ICD-10-CM | POA: Diagnosis not present

## 2022-03-09 DIAGNOSIS — A310 Pulmonary mycobacterial infection: Secondary | ICD-10-CM | POA: Diagnosis not present

## 2022-03-09 DIAGNOSIS — M545 Low back pain, unspecified: Secondary | ICD-10-CM | POA: Diagnosis not present

## 2022-03-09 DIAGNOSIS — I10 Essential (primary) hypertension: Secondary | ICD-10-CM | POA: Diagnosis not present

## 2022-03-09 DIAGNOSIS — B354 Tinea corporis: Secondary | ICD-10-CM | POA: Diagnosis not present

## 2022-03-09 DIAGNOSIS — F418 Other specified anxiety disorders: Secondary | ICD-10-CM | POA: Diagnosis not present

## 2022-03-09 DIAGNOSIS — M5416 Radiculopathy, lumbar region: Secondary | ICD-10-CM | POA: Diagnosis not present

## 2022-03-09 DIAGNOSIS — J9601 Acute respiratory failure with hypoxia: Secondary | ICD-10-CM | POA: Diagnosis not present

## 2022-03-09 DIAGNOSIS — K219 Gastro-esophageal reflux disease without esophagitis: Secondary | ICD-10-CM | POA: Diagnosis not present

## 2022-03-09 DIAGNOSIS — H3530 Unspecified macular degeneration: Secondary | ICD-10-CM | POA: Diagnosis not present

## 2022-03-09 DIAGNOSIS — Z853 Personal history of malignant neoplasm of breast: Secondary | ICD-10-CM | POA: Diagnosis not present

## 2022-03-09 DIAGNOSIS — F5104 Psychophysiologic insomnia: Secondary | ICD-10-CM | POA: Diagnosis not present

## 2022-03-09 DIAGNOSIS — J470 Bronchiectasis with acute lower respiratory infection: Secondary | ICD-10-CM | POA: Diagnosis not present

## 2022-03-09 DIAGNOSIS — E876 Hypokalemia: Secondary | ICD-10-CM | POA: Diagnosis not present

## 2022-03-09 DIAGNOSIS — J45901 Unspecified asthma with (acute) exacerbation: Secondary | ICD-10-CM | POA: Diagnosis not present

## 2022-03-09 DIAGNOSIS — J479 Bronchiectasis, uncomplicated: Secondary | ICD-10-CM | POA: Diagnosis not present

## 2022-03-09 DIAGNOSIS — D75839 Thrombocytosis, unspecified: Secondary | ICD-10-CM | POA: Diagnosis not present

## 2022-03-09 DIAGNOSIS — G8929 Other chronic pain: Secondary | ICD-10-CM | POA: Diagnosis not present

## 2022-03-09 DIAGNOSIS — M1990 Unspecified osteoarthritis, unspecified site: Secondary | ICD-10-CM | POA: Diagnosis not present

## 2022-03-09 DIAGNOSIS — M4856XD Collapsed vertebra, not elsewhere classified, lumbar region, subsequent encounter for fracture with routine healing: Secondary | ICD-10-CM | POA: Diagnosis not present

## 2022-03-09 DIAGNOSIS — E441 Mild protein-calorie malnutrition: Secondary | ICD-10-CM | POA: Diagnosis not present

## 2022-03-09 MED ORDER — SODIUM CHLORIDE 3 % IN NEBU
4.0000 mL | INHALATION_SOLUTION | Freq: Two times a day (BID) | RESPIRATORY_TRACT | 11 refills | Status: DC
Start: 2022-03-09 — End: 2022-03-10

## 2022-03-09 MED ORDER — TRELEGY ELLIPTA 200-62.5-25 MCG/ACT IN AEPB: 1.0000 | INHALATION_SPRAY | Freq: Every day | RESPIRATORY_TRACT | 11 refills | Status: DC

## 2022-03-09 NOTE — Patient Instructions (Signed)
Nice to see you again  I am glad you are feeling better  I refilled the Trelegy and saline nebulizer  I recommend using the saline nebulizer in the morning before you take Trelegy.  After the saline nebulizer, wait 30 minutes to an hour and then use Trelegy 1 puff every day.  Rinse your mouth out after every use.  Then, use the saline nebulizer in the evenings, either late afternoon before your dinner or after your dinner.  I recommend using an hour or 2 prior to going to bed at the latest.  Use albuterol as needed.  The goal is that these medications keep mucus moving and keep your lungs healthy, inflammation at bay to prevent worsening and need for hospitalization.  We will provide paperwork for manufacturing assistance for the Trelegy.  You are welcome to drop it off at any time once you you have completed the paperwork.  Return to clinic in 3 months or sooner as needed with Dr. Silas Flood

## 2022-03-10 ENCOUNTER — Telehealth: Payer: Self-pay | Admitting: Pulmonary Disease

## 2022-03-10 DIAGNOSIS — J470 Bronchiectasis with acute lower respiratory infection: Secondary | ICD-10-CM | POA: Diagnosis not present

## 2022-03-10 DIAGNOSIS — J9601 Acute respiratory failure with hypoxia: Secondary | ICD-10-CM | POA: Diagnosis not present

## 2022-03-10 DIAGNOSIS — J45901 Unspecified asthma with (acute) exacerbation: Secondary | ICD-10-CM | POA: Diagnosis not present

## 2022-03-10 DIAGNOSIS — I10 Essential (primary) hypertension: Secondary | ICD-10-CM | POA: Diagnosis not present

## 2022-03-10 DIAGNOSIS — J189 Pneumonia, unspecified organism: Secondary | ICD-10-CM | POA: Diagnosis not present

## 2022-03-10 DIAGNOSIS — A310 Pulmonary mycobacterial infection: Secondary | ICD-10-CM | POA: Diagnosis not present

## 2022-03-10 MED ORDER — SODIUM CHLORIDE 3 % IN NEBU: 4.0000 mL | INHALATION_SOLUTION | Freq: Two times a day (BID) | RESPIRATORY_TRACT | 11 refills | Status: DC
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
## 2022-03-10 NOTE — Telephone Encounter (Signed)
Called patient and advised her that I am attempted to send the saline nebs in through [facility]. I advised her to contact us if they get in contact with her about this medication if they do not have it in stock. Patient verbalized understanding. Nothing further needed at this time

## 2022-03-14 DIAGNOSIS — I10 Essential (primary) hypertension: Secondary | ICD-10-CM | POA: Diagnosis not present

## 2022-03-14 DIAGNOSIS — J9601 Acute respiratory failure with hypoxia: Secondary | ICD-10-CM | POA: Diagnosis not present

## 2022-03-14 DIAGNOSIS — J189 Pneumonia, unspecified organism: Secondary | ICD-10-CM | POA: Diagnosis not present

## 2022-03-14 DIAGNOSIS — J45901 Unspecified asthma with (acute) exacerbation: Secondary | ICD-10-CM | POA: Diagnosis not present

## 2022-03-14 DIAGNOSIS — J470 Bronchiectasis with acute lower respiratory infection: Secondary | ICD-10-CM | POA: Diagnosis not present

## 2022-03-14 DIAGNOSIS — A310 Pulmonary mycobacterial infection: Secondary | ICD-10-CM | POA: Diagnosis not present

## 2022-03-15 NOTE — Procedures (Signed)
Lumbar Epidural Steroid Injection - Interlaminar Approach with Fluoroscopic Guidance  Patient: Carla [name]      Date of Birth: 10-11-1925 MRN: [id number] PCP: [name], MD      Visit Date: 02/24/2022   Universal Protocol:     Consent Given By: the patient  Position: PRONE  Additional Comments: Vital signs were monitored before and after the procedure. Patient was prepped and draped in the usual sterile fashion. The correct patient, procedure, and site was verified.   Injection Procedure Details:   Procedure diagnoses: Lumbar radiculopathy [M54.16]   Meds Administered:  Meds ordered this encounter  Medications   methylPREDNISolone acetate (DEPO-MEDROL) injection 80 mg     Laterality: Left  Location/Site:  L5-S1  Needle: 3.5 in., 20 ga. Tuohy  Needle Placement: Paramedian epidural  Findings:   -Comments: Excellent flow of contrast into the epidural space.  Procedure Details: Using a paramedian approach from the side mentioned above, the region overlying the inferior lamina was localized under fluoroscopic visualization and the soft tissues overlying this structure were infiltrated with 4 ml. of 1% Lidocaine without Epinephrine. The Tuohy needle was inserted into the epidural space using a paramedian approach.   The epidural space was localized using loss of resistance along with counter oblique bi-planar fluoroscopic views.  After negative aspirate for air, blood, and CSF, a 2 ml. volume of Isovue-250 was injected into the epidural space and the flow of contrast was observed. Radiographs were obtained for documentation purposes.    The injectate was administered into the level noted above.   Additional Comments:  The patient tolerated the procedure well Dressing: 2 x 2 sterile gauze and Band-Aid    Post-procedure details: Patient was observed during the procedure. Post-procedure instructions were reviewed.  Patient left the clinic in stable condition.

## 2022-03-15 NOTE — Progress Notes (Signed)
Carla [name] - 88 y.o. female MRN [id number]  Date of birth: 03/30/1926  Office Visit Note: Visit Date: 02/24/2022 PCP: [name], MD Referred by: [name], MD  Subjective: Chief Complaint  Patient presents with   Lower Back - Pain   HPI:  Carla [name] is a 88 y.o. female who comes in today at the request of Barnet Pall, FNP for planned Left L5-S1 Lumbar Interlaminar epidural steroid injection with fluoroscopic guidance.  The patient has failed conservative care including home exercise, medications, time and activity modification.  This injection will be diagnostic and hopefully therapeutic.  Please see requesting physician notes for further details and justification.   ROS Otherwise per HPI.  Assessment & Plan: Visit Diagnoses:    ICD-10-CM   1. Lumbar radiculopathy  M54.16 XR C-ARM NO REPORT    Epidural Steroid injection    methylPREDNISolone acetate (DEPO-MEDROL) injection 80 mg      Plan: No additional findings.   Meds & Orders:  Meds ordered this encounter  Medications   methylPREDNISolone acetate (DEPO-MEDROL) injection 80 mg    Orders Placed This Encounter  Procedures   XR C-ARM NO REPORT   Epidural Steroid injection    Follow-up: Return for visit to requesting provider as needed.   Procedures: No procedures performed  Lumbar Epidural Steroid Injection - Interlaminar Approach with Fluoroscopic Guidance  Patient: Carla [name]      Date of Birth: [birth year] MRN: [id number] PCP: [name], MD      Visit Date: 02/24/2022   Universal Protocol:     Consent Given By: the patient  Position: PRONE  Additional Comments: Vital signs were monitored before and after the procedure. Patient was prepped and draped in the usual sterile fashion. The correct patient, procedure, and site was verified.   Injection Procedure Details:   Procedure diagnoses: Lumbar radiculopathy [M54.16]   Meds Administered:  Meds ordered this encounter   Medications   methylPREDNISolone acetate (DEPO-MEDROL) injection 80 mg     Laterality: Left  Location/Site:  L5-S1  Needle: 3.5 in., 20 ga. Tuohy  Needle Placement: Paramedian epidural  Findings:   -Comments: Excellent flow of contrast into the epidural space.  Procedure Details: Using a paramedian approach from the side mentioned above, the region overlying the inferior lamina was localized under fluoroscopic visualization and the soft tissues overlying this structure were infiltrated with 4 ml. of 1% Lidocaine without Epinephrine. The Tuohy needle was inserted into the epidural space using a paramedian approach.   The epidural space was localized using loss of resistance along with counter oblique bi-planar fluoroscopic views.  After negative aspirate for air, blood, and CSF, a 2 ml. volume of Isovue-250 was injected into the epidural space and the flow of contrast was observed. Radiographs were obtained for documentation purposes.    The injectate was administered into the level noted above.   Additional Comments:  The patient tolerated the procedure well Dressing: 2 x 2 sterile gauze and Band-Aid    Post-procedure details: Patient was observed during the procedure. Post-procedure instructions were reviewed.  Patient left the clinic in stable condition.   Clinical History: EXAM: MRI LUMBAR SPINE WITHOUT CONTRAST   TECHNIQUE: Multiplanar, multisequence MR imaging of the lumbar spine was performed. No intravenous contrast was administered.   COMPARISON:  Lumbar spine radiographs 08/18/2021   FINDINGS: Segmentation: Transitional lumbosacral anatomy with partial sacralization of L5. Pseudoarticulation between an enlarged left L5 transverse process and the sacrum.   Alignment: Facet mediated anterolisthesis  of L4 on L5 measuring 7 mm.   Vertebrae: L2 superior plate compression fracture with 35% anterior vertebral body height loss and mild marrow edema, similar to  the prior radiographs. L3 superior endplate compression fracture with 20% vertebral body height loss, not apparent on the prior radiographs and with mild-to-moderate marrow edema. Partially visualized extensive bilateral sacral ala edema related to insufficiency type fractures. No suspicious marrow lesion.   Conus medullaris and cauda equina: Conus extends to the L1 level. Conus and cauda equina appear normal.   Paraspinal and other soft tissues: Subcentimeter T2 hyperintensity in the left kidney, likely a cyst.   Disc levels:   Disc desiccation throughout the lumbar and included lower thoracic spine with multilevel disc space narrowing, moderate at L4-5.   T11-12: Mild disc bulging without stenosis.   T12-L1: Minimal disc bulging and mild facet and ligamentum flavum hypertrophy without stenosis.   L1-2: Disc bulging and moderate facet and ligamentum flavum hypertrophy result in mild bilateral lateral recess stenosis without spinal or neural foraminal stenosis.   L2-3: Disc bulging and moderate facet and ligamentum flavum hypertrophy result in mild spinal stenosis without spinal or neural foraminal stenosis.   L3-4: Disc bulging and moderate facet and ligamentum flavum hypertrophy result in moderate spinal stenosis, mild bilateral lateral recess stenosis, and mild bilateral neural foraminal stenosis.   L4-5: Anterolisthesis with bulging uncovered disc and severe facet and ligamentum flavum hypertrophy result in markedly severe spinal stenosis and moderate right greater than left neural foraminal stenosis.   L5-S1: Moderate to severe facet and ligamentum flavum hypertrophy result in mild-to-moderate right lateral recess stenosis without significant generalized spinal stenosis or neural foraminal stenosis.   IMPRESSION: 1. Acute/subacute L2 and L3 compression fractures. 2. Acute bilateral sacral fractures. 3. Diffuse lumbar disc and facet degeneration, most notable at  L4-5 where there is grade 1 anterolisthesis, markedly severe spinal stenosis, and moderate neural foraminal stenosis.     Electronically Signed   By: Logan Bores M.D.   On: 09/08/2021 14:07     Objective:  VS:  HT:    WT:   BMI:     BP:122/71  HR:(!) 40bpm  TEMP: ( )  RESP:  Physical Exam Vitals and nursing note reviewed.  Constitutional:      General: She is not in acute distress.    Appearance: Normal appearance. She is not ill-appearing.  HENT:     Head: Normocephalic and atraumatic.     Right Ear: External ear normal.     Left Ear: External ear normal.  Eyes:     Extraocular Movements: Extraocular movements intact.  Cardiovascular:     Rate and Rhythm: Normal rate.     Pulses: Normal pulses.  Pulmonary:     Effort: Pulmonary effort is normal. No respiratory distress.  Abdominal:     General: There is no distension.     Palpations: Abdomen is soft.  Musculoskeletal:        General: Tenderness present.     Cervical back: Neck supple.     Right lower leg: No edema.     Left lower leg: No edema.     Comments: Patient has good distal strength with no pain over the greater trochanters.  No clonus or focal weakness.  Skin:    Findings: No erythema, lesion or rash.  Neurological:     General: No focal deficit present.     Mental Status: She is alert and oriented to person, place, and time.  Sensory: No sensory deficit.     Motor: No weakness or abnormal muscle tone.     Coordination: Coordination normal.  Psychiatric:        Mood and Affect: Mood normal.        Behavior: Behavior normal.      Imaging: No results found.

## 2022-03-21 ENCOUNTER — Telehealth: Payer: Self-pay | Admitting: Pulmonary Disease

## 2022-03-21 DIAGNOSIS — J479 Bronchiectasis, uncomplicated: Secondary | ICD-10-CM

## 2022-03-21 NOTE — Telephone Encounter (Signed)
Called and spoke to daughter and advised her that since most pharmacies are out of stock on that medication she can use her albuterol nebs in place of the sodium chloride nebs. Daughter verbalized understanding nothing further needed

## 2022-03-24 DIAGNOSIS — J9601 Acute respiratory failure with hypoxia: Secondary | ICD-10-CM | POA: Diagnosis not present

## 2022-03-24 DIAGNOSIS — J45901 Unspecified asthma with (acute) exacerbation: Secondary | ICD-10-CM | POA: Diagnosis not present

## 2022-03-24 DIAGNOSIS — J189 Pneumonia, unspecified organism: Secondary | ICD-10-CM | POA: Diagnosis not present

## 2022-03-24 DIAGNOSIS — A310 Pulmonary mycobacterial infection: Secondary | ICD-10-CM | POA: Diagnosis not present

## 2022-03-24 DIAGNOSIS — I10 Essential (primary) hypertension: Secondary | ICD-10-CM | POA: Diagnosis not present

## 2022-03-24 DIAGNOSIS — J470 Bronchiectasis with acute lower respiratory infection: Secondary | ICD-10-CM | POA: Diagnosis not present

## 2022-03-30 DIAGNOSIS — I10 Essential (primary) hypertension: Secondary | ICD-10-CM | POA: Diagnosis not present

## 2022-03-30 DIAGNOSIS — J189 Pneumonia, unspecified organism: Secondary | ICD-10-CM | POA: Diagnosis not present

## 2022-03-30 DIAGNOSIS — J470 Bronchiectasis with acute lower respiratory infection: Secondary | ICD-10-CM | POA: Diagnosis not present

## 2022-03-30 DIAGNOSIS — J45901 Unspecified asthma with (acute) exacerbation: Secondary | ICD-10-CM | POA: Diagnosis not present

## 2022-03-30 DIAGNOSIS — A310 Pulmonary mycobacterial infection: Secondary | ICD-10-CM | POA: Diagnosis not present

## 2022-03-30 DIAGNOSIS — J9601 Acute respiratory failure with hypoxia: Secondary | ICD-10-CM | POA: Diagnosis not present

## 2022-04-01 ENCOUNTER — Telehealth: Payer: Self-pay | Admitting: Pulmonary Disease

## 2022-04-01 NOTE — Telephone Encounter (Signed)
Routing to Dr [employer] nurse who will be back in clinic 04/04/22. Thanks!

## 2022-04-04 NOTE — Telephone Encounter (Signed)
Called and left voicemail; for patient that I did have her paperwork for her patient assistance for her medication. I told her that Alpha is back in office on Thursday and that I would call her when I get the paperwork filled out and faxed on Thursday and that if she had any questions or concerns to call the office. Nothing further needed

## 2022-04-07 ENCOUNTER — Other Ambulatory Visit: Payer: Self-pay

## 2022-04-07 MED ORDER — TRELEGY ELLIPTA 200-62.5-25 MCG/ACT IN AEPB: 1.0000 | INHALATION_SPRAY | Freq: Every day | RESPIRATORY_TRACT | 11 refills | Status: DC

## 2022-06-02 DIAGNOSIS — Z1231 Encounter for screening mammogram for malignant neoplasm of breast: Secondary | ICD-10-CM | POA: Diagnosis not present

## 2022-06-14 ENCOUNTER — Ambulatory Visit: Payer: Medicare Other | Admitting: Pulmonary Disease

## 2022-06-22 ENCOUNTER — Encounter: Payer: Self-pay | Admitting: Pulmonary Disease

## 2022-06-22 ENCOUNTER — Ambulatory Visit (INDEPENDENT_AMBULATORY_CARE_PROVIDER_SITE_OTHER): Payer: Medicare Other | Admitting: Pulmonary Disease

## 2022-06-22 VITALS — BP 128/82 | HR 66 | Ht 60.0 in | Wt 116.0 lb

## 2022-06-22 DIAGNOSIS — J479 Bronchiectasis, uncomplicated: Secondary | ICD-10-CM | POA: Diagnosis not present

## 2022-06-22 DIAGNOSIS — J4540 Moderate persistent asthma, uncomplicated: Secondary | ICD-10-CM

## 2022-06-22 DIAGNOSIS — Z23 Encounter for immunization: Secondary | ICD-10-CM | POA: Diagnosis not present

## 2022-06-22 NOTE — Patient Instructions (Signed)
Nice to see you again  I am glad the Trelegy continues to be beneficial  No changes to medication today  If you notice worsening cough with sputum production for 48 hours I recommend resuming the saline twice a day for a few days with the supply you have.  Please call me if you need to resume using this as it may be a sign that we need an antibiotic.  Return to clinic in 6 months or sooner as needed with Dr. Silas Flood
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
## 2022-07-24 IMAGING — DX DG CHEST 2V
2 series · 2 of 2 positions shown · non-contrast
Comparison: None.

CLINICAL DATA: Shortness of breath and cough

EXAM:
CHEST - 2 VIEW

[chest pa]
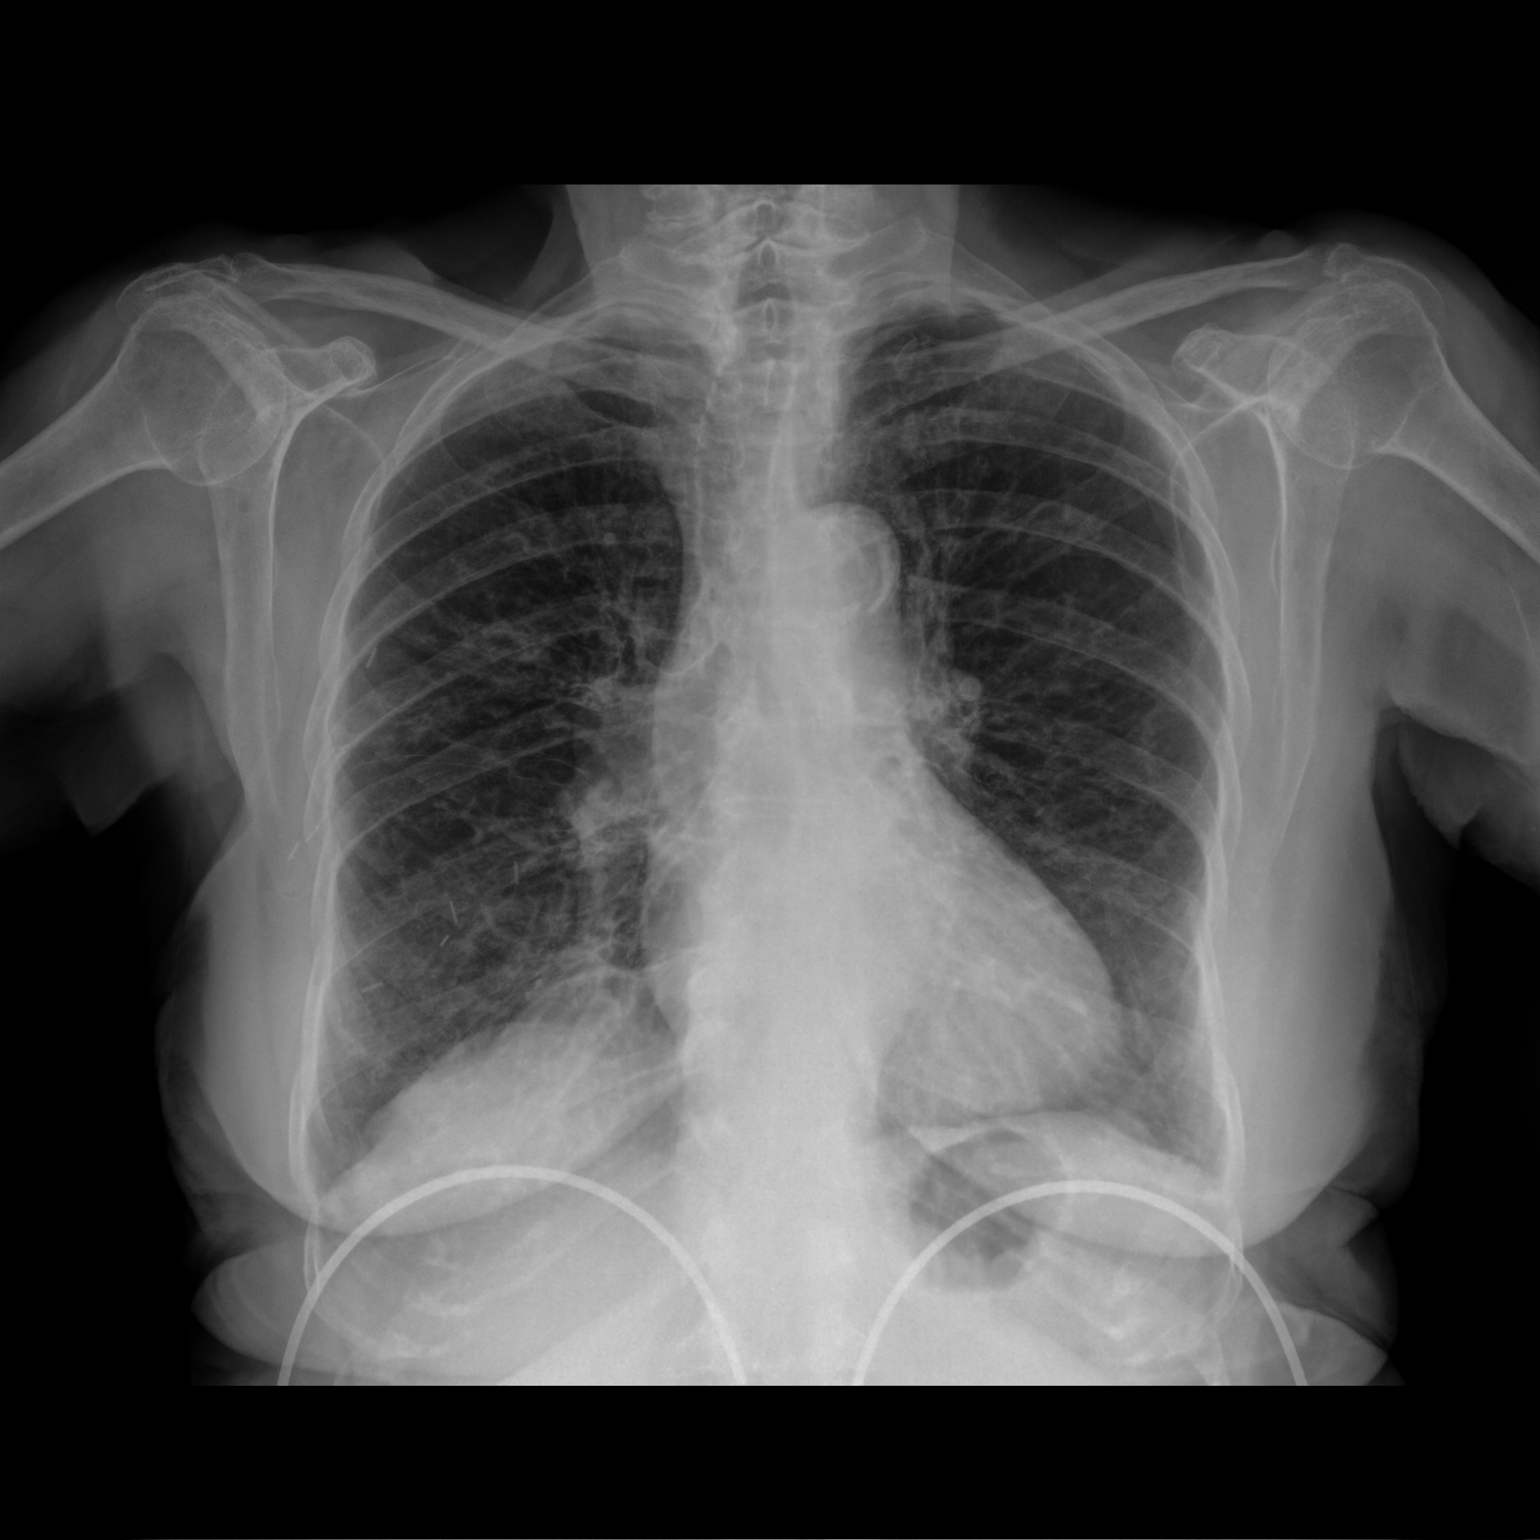

[chest lat]
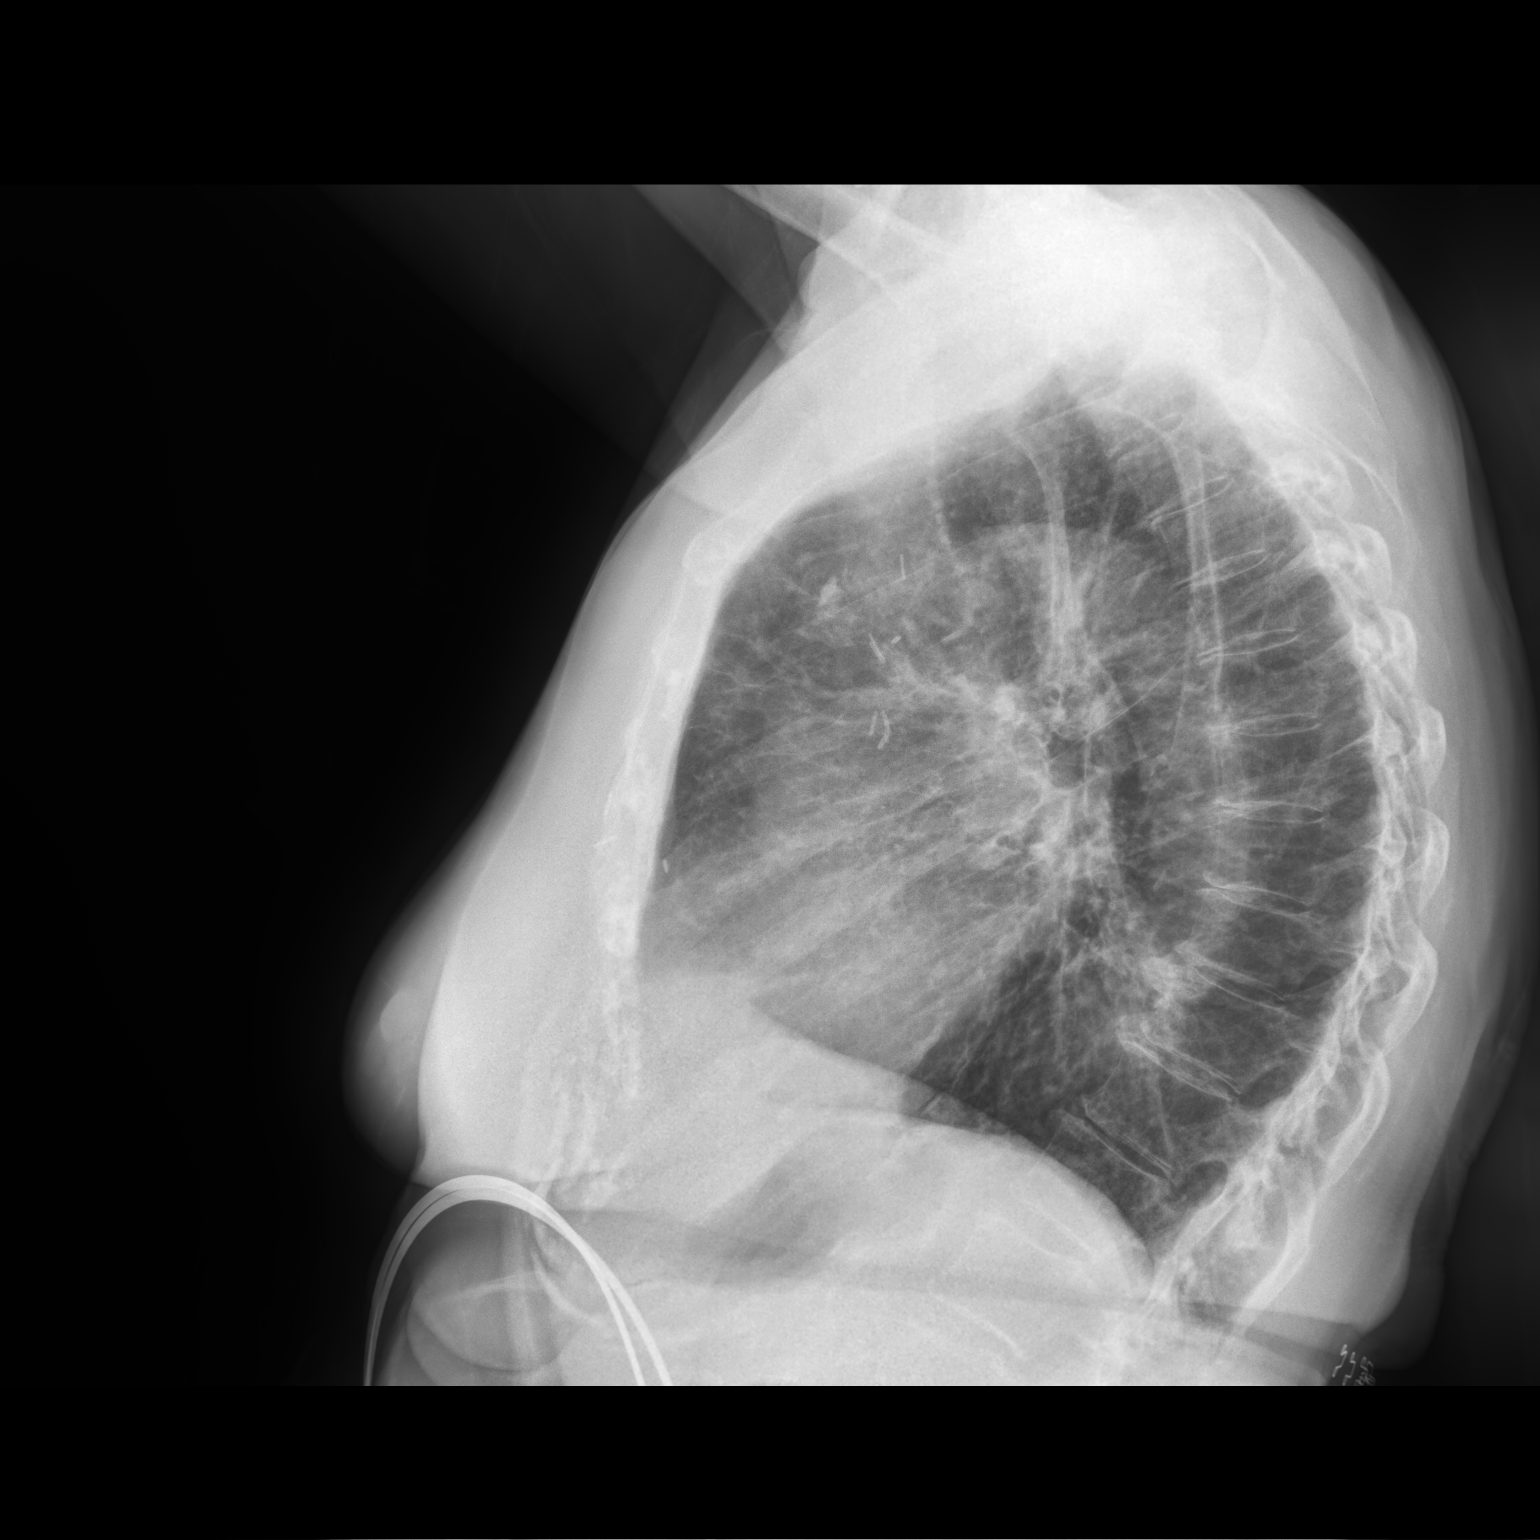

[2 of 2 positions shown; findings below may reference images not displayed]

FINDINGS: The heart size and mediastinal contours are unremarkable. Mildly
increased interstitial markings are seen throughout both lungs.
Aortic knob calcifications are seen. No pleural effusion.
IMPRESSION: Mildly increased interstitial markings are seen throughout which
could be due to mild interstitial edema and/or infectious etiology.

## 2022-07-29 DIAGNOSIS — Z23 Encounter for immunization: Secondary | ICD-10-CM | POA: Diagnosis not present

## 2022-08-09 DIAGNOSIS — R82998 Other abnormal findings in urine: Secondary | ICD-10-CM | POA: Diagnosis not present

## 2022-08-09 DIAGNOSIS — S32010A Wedge compression fracture of first lumbar vertebra, initial encounter for closed fracture: Secondary | ICD-10-CM | POA: Diagnosis not present

## 2022-08-09 DIAGNOSIS — Z79899 Other long term (current) drug therapy: Secondary | ICD-10-CM | POA: Diagnosis not present

## 2022-08-09 DIAGNOSIS — I10 Essential (primary) hypertension: Secondary | ICD-10-CM | POA: Diagnosis not present

## 2022-08-09 DIAGNOSIS — F419 Anxiety disorder, unspecified: Secondary | ICD-10-CM | POA: Diagnosis not present

## 2022-08-09 DIAGNOSIS — Z853 Personal history of malignant neoplasm of breast: Secondary | ICD-10-CM | POA: Diagnosis not present

## 2022-08-09 DIAGNOSIS — Z1339 Encounter for screening examination for other mental health and behavioral disorders: Secondary | ICD-10-CM | POA: Diagnosis not present

## 2022-08-09 DIAGNOSIS — G2581 Restless legs syndrome: Secondary | ICD-10-CM | POA: Diagnosis not present

## 2022-08-09 DIAGNOSIS — Z23 Encounter for immunization: Secondary | ICD-10-CM | POA: Diagnosis not present

## 2022-08-09 DIAGNOSIS — Z1331 Encounter for screening for depression: Secondary | ICD-10-CM | POA: Diagnosis not present

## 2022-08-09 DIAGNOSIS — R7989 Other specified abnormal findings of blood chemistry: Secondary | ICD-10-CM | POA: Diagnosis not present

## 2022-08-09 DIAGNOSIS — Z0000 Encounter for general adult medical examination without abnormal findings: Secondary | ICD-10-CM | POA: Diagnosis not present

## 2022-08-09 DIAGNOSIS — M792 Neuralgia and neuritis, unspecified: Secondary | ICD-10-CM | POA: Diagnosis not present

## 2022-08-09 DIAGNOSIS — K219 Gastro-esophageal reflux disease without esophagitis: Secondary | ICD-10-CM | POA: Diagnosis not present

## 2022-08-09 DIAGNOSIS — J479 Bronchiectasis, uncomplicated: Secondary | ICD-10-CM | POA: Diagnosis not present

## 2022-08-13 ENCOUNTER — Other Ambulatory Visit: Payer: Self-pay | Admitting: Pulmonary Disease

## 2022-11-30 ENCOUNTER — Ambulatory Visit (INDEPENDENT_AMBULATORY_CARE_PROVIDER_SITE_OTHER): Payer: Medicare Other

## 2022-11-30 ENCOUNTER — Ambulatory Visit (INDEPENDENT_AMBULATORY_CARE_PROVIDER_SITE_OTHER): Payer: Medicare Other | Admitting: Orthopaedic Surgery

## 2022-11-30 DIAGNOSIS — M25511 Pain in right shoulder: Secondary | ICD-10-CM | POA: Diagnosis not present

## 2022-11-30 MED ORDER — METHYLPREDNISOLONE ACETATE 40 MG/ML IJ SUSP
40.0000 mg | INTRAMUSCULAR | Status: AC | PRN
  Administered 2022-11-30: 40 mg via INTRA_ARTICULAR

## 2022-11-30 MED ORDER — LIDOCAINE HCL 1 % IJ SOLN
3.0000 mL | INTRAMUSCULAR | Status: AC | PRN
  Administered 2022-11-30: 3 mL

## 2022-11-30 NOTE — Progress Notes (Signed)
The patient is a 89 year old female who comes in with right shoulder pain that does wake her up at night.  She has had no known injury but she said at age 35 she did have an open rotator cuff repair of that right shoulder.  He has been going on for about 6 weeks now.  It hurts her worse at night.  Sometimes it does radiate into her hand but it seems like most the pain is around her shoulder.  She is asked about the possibility of a steroid injection.  She has tried Voltaren gel as well.  Her right shoulder is well located.  She does not show any deficits of her rotator cuff.  She does seem to have some pain around the subacromial outlet of the shoulder but it moves well.  Her rotator cuff is intact on clinical exam in terms of good abduction without using more of a rotator cuff than her deltoid to abduct her shoulder.  3 views of the right shoulder show no acute findings.  The humeral head is actually well located within the glenoid and there is no significant glenohumeral arthritis.  The humeral head is not high riding in the subacromial outlet is wide open.  I did recommend at least a steroid injection in her right shoulder subacromial aloe which she agreed to and tolerated well.  Hopefully this will help calm down her symptoms.  All questions and concerns were answered and addressed.  Follow-up is as needed unless things worsen or this does not help.     Procedure Note  Patient: Carla [name]             Date of Birth: December 30, 1925           MRN: [id number]             Visit Date: 11/30/2022  Procedures: Visit Diagnoses:  1. Acute pain of right shoulder     Large Joint Inj: R subacromial bursa on 11/30/2022 4:58 PM Indications: pain and diagnostic evaluation Details: 22 G 1.5 in needle  Arthrogram: No  Medications: 3 mL lidocaine 1 %; 40 mg methylPREDNISolone acetate 40 MG/ML Outcome: tolerated well, no immediate complications Procedure, treatment alternatives, risks and benefits  explained, specific risks discussed. Consent was given by the patient. Immediately prior to procedure a time out was called to verify the correct patient, procedure, equipment, support staff and site/side marked as required. Patient was prepped and draped in the usual sterile fashion.

## 2023-02-21 DIAGNOSIS — H353133 Nonexudative age-related macular degeneration, bilateral, advanced atrophic without subfoveal involvement: Secondary | ICD-10-CM | POA: Diagnosis not present

## 2023-02-21 DIAGNOSIS — H5213 Myopia, bilateral: Secondary | ICD-10-CM | POA: Diagnosis not present

## 2023-02-21 DIAGNOSIS — H04123 Dry eye syndrome of bilateral lacrimal glands: Secondary | ICD-10-CM | POA: Diagnosis not present

## 2023-02-21 DIAGNOSIS — H43813 Vitreous degeneration, bilateral: Secondary | ICD-10-CM | POA: Diagnosis not present

## 2023-02-24 DIAGNOSIS — M79674 Pain in right toe(s): Secondary | ICD-10-CM | POA: Diagnosis not present

## 2023-03-14 ENCOUNTER — Telehealth: Payer: Self-pay | Admitting: Pulmonary Disease

## 2023-03-14 NOTE — Telephone Encounter (Signed)
Pt. Needs to do new  application for pt. Assitance for trelgy and needs new script for Trelgy can we call back pt. To get the paperwork started again

## 2023-03-15 ENCOUNTER — Other Ambulatory Visit: Payer: Self-pay

## 2023-03-15 MED ORDER — TRELEGY ELLIPTA 200-62.5-25 MCG/ACT IN AEPB: 1.0000 | INHALATION_SPRAY | Freq: Every day | RESPIRATORY_TRACT | 5 refills | Status: DC

## 2023-03-15 NOTE — Telephone Encounter (Signed)
Spoke with patients Daughter. She is requesting paperwork for Trelegy patient assistance. I advised I would place in mail for her today and send to [employer] once completed. She also requested a prescription for Trelegy to be sent to [facility] since patient only receives patient assistance for 6 months. NFN at this time

## 2023-03-15 NOTE — Telephone Encounter (Signed)
Spoke with patients daughter. She is requesting new paperwork for Trelegy patient assistance. I advised I would get paperwork placed in the mail today. She has also requested a prescript for Trelegy to be sent to patients pharmacy. Prescription has been sent and I advised someone would call her back once paperwork has been sent. NFN at this time

## 2023-03-28 DIAGNOSIS — M79671 Pain in right foot: Secondary | ICD-10-CM | POA: Diagnosis not present

## 2023-03-28 DIAGNOSIS — L84 Corns and callosities: Secondary | ICD-10-CM | POA: Diagnosis not present

## 2023-04-05 ENCOUNTER — Telehealth: Payer: Self-pay | Admitting: Pulmonary Disease

## 2023-04-05 NOTE — Telephone Encounter (Signed)
Pt was given [employer] paper work for trelegy and paperwork was dropped off yesterday 10 mins before closing and dropped of with Dellis Filbert

## 2023-04-06 MED ORDER — TRELEGY ELLIPTA 200-62.5-25 MCG/ACT IN AEPB: 1.0000 | INHALATION_SPRAY | Freq: Every day | RESPIRATORY_TRACT | 3 refills | Status: DC

## 2023-04-06 NOTE — Telephone Encounter (Signed)
Received forms from front desk. RX has been printed. Will give to MH to sign next week and fax to [employer].

## 2023-04-26 ENCOUNTER — Encounter: Payer: Self-pay | Admitting: Pulmonary Disease

## 2023-04-26 ENCOUNTER — Ambulatory Visit: Payer: Medicare Other | Admitting: Pulmonary Disease

## 2023-04-26 VITALS — BP 116/64 | HR 74 | Ht 60.0 in | Wt 119.8 lb

## 2023-04-26 DIAGNOSIS — J4540 Moderate persistent asthma, uncomplicated: Secondary | ICD-10-CM

## 2023-04-26 DIAGNOSIS — J479 Bronchiectasis, uncomplicated: Secondary | ICD-10-CM | POA: Diagnosis not present

## 2023-04-26 NOTE — Patient Instructions (Signed)
Great to see you again  I am glad you are doing well doing well  There should be some prescription of Trelegy on file at [facility] in case you get 12 by  Use the samples provided today  Hopefully will get approved for the [employer] assistance soon  Return to clinic in 6 months or sooner as needed
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
## 2023-06-07 DIAGNOSIS — Z1231 Encounter for screening mammogram for malignant neoplasm of breast: Secondary | ICD-10-CM | POA: Diagnosis not present

## 2023-06-08 ENCOUNTER — Telehealth: Payer: Self-pay | Admitting: Pulmonary Disease

## 2023-06-08 NOTE — Telephone Encounter (Signed)
PT daughter calling because Dr. Rexene Edison says on AVS she needs to be sen in 6 mo. But PT heard D. say  1 year at the last appt. Please call PT to advise what is correct.   She said mom is doing great. Her number is  [phone] , [name]  If it is 1 year the recall needs to be set for June of 2025.

## 2023-06-09 NOTE — Telephone Encounter (Signed)
Dr. Judeth Horn, please advise if pt needs to follow up with you in 6 months or if it should be 1 year for follow up.

## 2023-06-09 NOTE — Telephone Encounter (Signed)
1 year ok if doing well - thanks!

## 2023-06-12 NOTE — Telephone Encounter (Signed)
LM for [name], daughter that 1 year was what Dr. Lenna Gilford. Adjusted her  recall for 03/2024. NFN

## 2023-06-14 DIAGNOSIS — H353132 Nonexudative age-related macular degeneration, bilateral, intermediate dry stage: Secondary | ICD-10-CM | POA: Diagnosis not present

## 2023-06-14 DIAGNOSIS — H5000 Unspecified esotropia: Secondary | ICD-10-CM | POA: Diagnosis not present

## 2023-06-21 DIAGNOSIS — Z23 Encounter for immunization: Secondary | ICD-10-CM | POA: Diagnosis not present

## 2023-07-07 DIAGNOSIS — N6311 Unspecified lump in the right breast, upper outer quadrant: Secondary | ICD-10-CM | POA: Diagnosis not present

## 2023-07-07 DIAGNOSIS — R928 Other abnormal and inconclusive findings on diagnostic imaging of breast: Secondary | ICD-10-CM | POA: Diagnosis not present

## 2023-07-07 DIAGNOSIS — R92321 Mammographic fibroglandular density, right breast: Secondary | ICD-10-CM | POA: Diagnosis not present

## 2023-07-13 DIAGNOSIS — N6311 Unspecified lump in the right breast, upper outer quadrant: Secondary | ICD-10-CM | POA: Diagnosis not present

## 2023-07-13 DIAGNOSIS — C50411 Malignant neoplasm of upper-outer quadrant of right female breast: Secondary | ICD-10-CM | POA: Diagnosis not present

## 2023-07-13 DIAGNOSIS — D0511 Intraductal carcinoma in situ of right breast: Secondary | ICD-10-CM | POA: Diagnosis not present

## 2023-07-13 DIAGNOSIS — R928 Other abnormal and inconclusive findings on diagnostic imaging of breast: Secondary | ICD-10-CM | POA: Diagnosis not present

## 2023-07-13 DIAGNOSIS — N63 Unspecified lump in unspecified breast: Secondary | ICD-10-CM | POA: Diagnosis not present

## 2023-07-13 DIAGNOSIS — Z170 Estrogen receptor positive status [ER+]: Secondary | ICD-10-CM | POA: Diagnosis not present

## 2023-07-21 DIAGNOSIS — R928 Other abnormal and inconclusive findings on diagnostic imaging of breast: Secondary | ICD-10-CM | POA: Diagnosis not present

## 2023-07-21 DIAGNOSIS — Z1589 Genetic susceptibility to other disease: Secondary | ICD-10-CM | POA: Diagnosis not present

## 2023-07-21 DIAGNOSIS — C50911 Malignant neoplasm of unspecified site of right female breast: Secondary | ICD-10-CM | POA: Diagnosis not present

## 2023-07-27 DIAGNOSIS — C50411 Malignant neoplasm of upper-outer quadrant of right female breast: Secondary | ICD-10-CM | POA: Diagnosis not present

## 2023-07-27 DIAGNOSIS — C50911 Malignant neoplasm of unspecified site of right female breast: Secondary | ICD-10-CM | POA: Diagnosis not present

## 2023-07-28 HISTORY — PX: BREAST LUMPECTOMY: SHX2

## 2023-08-08 DIAGNOSIS — C50811 Malignant neoplasm of overlapping sites of right female breast: Secondary | ICD-10-CM | POA: Diagnosis not present

## 2023-08-08 DIAGNOSIS — C50911 Malignant neoplasm of unspecified site of right female breast: Secondary | ICD-10-CM | POA: Diagnosis not present

## 2023-08-15 DIAGNOSIS — I10 Essential (primary) hypertension: Secondary | ICD-10-CM | POA: Diagnosis not present

## 2023-08-15 DIAGNOSIS — M792 Neuralgia and neuritis, unspecified: Secondary | ICD-10-CM | POA: Diagnosis not present

## 2023-08-15 DIAGNOSIS — A312 Disseminated mycobacterium avium-intracellulare complex (DMAC): Secondary | ICD-10-CM | POA: Diagnosis not present

## 2023-08-15 DIAGNOSIS — K219 Gastro-esophageal reflux disease without esophagitis: Secondary | ICD-10-CM | POA: Diagnosis not present

## 2023-08-15 DIAGNOSIS — J479 Bronchiectasis, uncomplicated: Secondary | ICD-10-CM | POA: Diagnosis not present

## 2023-08-15 DIAGNOSIS — R252 Cramp and spasm: Secondary | ICD-10-CM | POA: Diagnosis not present

## 2023-08-15 DIAGNOSIS — G2581 Restless legs syndrome: Secondary | ICD-10-CM | POA: Diagnosis not present

## 2023-08-15 DIAGNOSIS — E46 Unspecified protein-calorie malnutrition: Secondary | ICD-10-CM | POA: Diagnosis not present

## 2023-08-15 DIAGNOSIS — F419 Anxiety disorder, unspecified: Secondary | ICD-10-CM | POA: Diagnosis not present

## 2023-08-15 DIAGNOSIS — D75839 Thrombocytosis, unspecified: Secondary | ICD-10-CM | POA: Diagnosis not present

## 2023-08-15 DIAGNOSIS — Z0000 Encounter for general adult medical examination without abnormal findings: Secondary | ICD-10-CM | POA: Diagnosis not present

## 2023-08-15 DIAGNOSIS — S32010A Wedge compression fracture of first lumbar vertebra, initial encounter for closed fracture: Secondary | ICD-10-CM | POA: Diagnosis not present

## 2023-08-16 DIAGNOSIS — C50911 Malignant neoplasm of unspecified site of right female breast: Secondary | ICD-10-CM | POA: Diagnosis not present

## 2023-08-16 DIAGNOSIS — Z1589 Genetic susceptibility to other disease: Secondary | ICD-10-CM | POA: Diagnosis not present

## 2023-08-28 ENCOUNTER — Other Ambulatory Visit: Payer: Self-pay | Admitting: Pulmonary Disease

## 2023-08-29 DIAGNOSIS — C50911 Malignant neoplasm of unspecified site of right female breast: Secondary | ICD-10-CM | POA: Diagnosis not present

## 2023-11-05 DIAGNOSIS — N3001 Acute cystitis with hematuria: Secondary | ICD-10-CM | POA: Diagnosis not present

## 2023-11-16 ENCOUNTER — Ambulatory Visit (HOSPITAL_BASED_OUTPATIENT_CLINIC_OR_DEPARTMENT_OTHER): Payer: Self-pay | Admitting: Pulmonary Disease

## 2023-11-16 ENCOUNTER — Telehealth: Payer: Self-pay | Admitting: Pulmonary Disease

## 2023-11-16 MED ORDER — PREDNISONE 20 MG PO TABS: 20.0000 mg | ORAL_TABLET | Freq: Every day | ORAL | 0 refills | Status: AC

## 2023-11-16 NOTE — Addendum Note (Signed)
Addended byVilma Meckel on: 11/16/2023 04:36 PM   Modules accepted: Orders

## 2023-11-16 NOTE — Telephone Encounter (Signed)
The patient reported that she has an ongoing productive cough with cream colored and clear sputum that started last weekend. She said she has "heavy coughing spells and sneezing." Her nose has been runny as well with clear nasal discharge.  She denied any other symptoms including shortness of breath or chest pain.  She denied need for her rescue inhaler.  She requested a prescription for the cough.  She has not taken any other over the counter medication besides Mucinex because she was instructed not to while taking Trelegy. Routed to pulmonologist to advise.    Reason for Disposition  SEVERE coughing spells (e.g., whooping sound after coughing, vomiting after coughing)  Answer Assessment - Initial Assessment Questions 1. ONSET: "When did the cough begin?"      Last weekend  2. SEVERITY: "How bad is the cough today?"      Heavy coughing spells and sneezing  3. SPUTUM: "Describe the color of your sputum" (none, dry cough; clear, white, yellow, green)     Cream and clear  4. HEMOPTYSIS: "Are you coughing up any blood?" If so ask: "How much?" (flecks, streaks, tablespoons, etc.)     None  5. DIFFICULTY BREATHING: "Are you having difficulty breathing?" If Yes, ask: "How bad is it?" (e.g., mild, moderate, severe)    - MILD: No SOB at rest, mild SOB with walking, speaks normally in sentences, can lie down, no retractions, pulse < 100.    - MODERATE: SOB at rest, SOB with minimal exertion and prefers to sit, cannot lie down flat, speaks in phrases, mild retractions, audible wheezing, pulse 100-120.    - SEVERE: Very SOB at rest, speaks in single words, struggling to breathe, sitting hunched forward, retractions, pulse > 120      Shortness of breath due to coughing  6. FEVER: "Do you have a fever?" If Yes, ask: "What is your temperature, how was it measured, and when did it start?"     No fever  8. LUNG HISTORY: "Do you have any history of lung disease?"  (e.g., pulmonary embolus, asthma, emphysema)      Pulmonary nodules, asthma   10. OTHER SYMPTOMS: "Do you have any other symptoms?" (e.g., runny nose, wheezing, chest pain)       Runny nose - clear discharge productive cough  Protocols used: Cough - Acute Productive-A-AH

## 2023-11-16 NOTE — Telephone Encounter (Signed)
Sounds like a virus mainly affecting sinuses. Prednisone 20 mg daily for 5 days sent to help decrease inflammation and mucous production.

## 2023-11-16 NOTE — Telephone Encounter (Signed)
I notified the patient's daughter.  Nothing further needed.

## 2023-11-16 NOTE — Telephone Encounter (Signed)
Please see last encounter. Pharmacy not mentioned. It is [facility] in [place]  Best # to call is [phone]

## 2023-11-17 NOTE — Telephone Encounter (Signed)
ATC the patient. The phone just rang and I did not get a voicemail. I did contact [facility] pharmacy and the patient did pick up the Prednisone yesterday.  Nothing further needed.

## 2023-11-27 ENCOUNTER — Encounter: Payer: Self-pay | Admitting: Internal Medicine

## 2023-11-27 ENCOUNTER — Ambulatory Visit

## 2023-11-27 ENCOUNTER — Ambulatory Visit (HOSPITAL_BASED_OUTPATIENT_CLINIC_OR_DEPARTMENT_OTHER): Payer: Self-pay | Admitting: Pulmonary Disease

## 2023-11-27 ENCOUNTER — Ambulatory Visit (INDEPENDENT_AMBULATORY_CARE_PROVIDER_SITE_OTHER): Admitting: Internal Medicine

## 2023-11-27 VITALS — BP 128/68 | HR 77 | Ht 60.0 in | Wt 116.0 lb

## 2023-11-27 DIAGNOSIS — J471 Bronchiectasis with (acute) exacerbation: Secondary | ICD-10-CM | POA: Diagnosis not present

## 2023-11-27 DIAGNOSIS — I700 Atherosclerosis of aorta: Secondary | ICD-10-CM | POA: Diagnosis not present

## 2023-11-27 DIAGNOSIS — J4540 Moderate persistent asthma, uncomplicated: Secondary | ICD-10-CM

## 2023-11-27 DIAGNOSIS — R059 Cough, unspecified: Secondary | ICD-10-CM | POA: Diagnosis not present

## 2023-11-27 MED ORDER — CIPROFLOXACIN HCL 500 MG PO TABS: 500.0000 mg | ORAL_TABLET | Freq: Two times a day (BID) | ORAL | 0 refills | Status: DC

## 2023-11-27 NOTE — Telephone Encounter (Signed)
 The patient reported an ongoing productive cough for the last 1.5 weeks.  She has been taking Mucinex and her cough has improved but still ongoing both day and night.  She has been coughing up "lumps" of tan colored sputum.  She experiences increased shortness of breath with coughing.  She denied the need for her rescue inhaler more frequently than usual.  She denied a fever or any other symptoms.   She had double pneumonia in April last year and is concerned that her symptoms will progress. She has a history of Bronchiectasis.  She was transferred to [facility] for scheduling options as there are same day appointments available.    Reason for Disposition  SEVERE coughing spells (e.g., whooping sound after coughing, vomiting after coughing)  Answer Assessment - Initial Assessment Questions 1. ONSET: "When did the cough begin?"      1.5 weeks ago 2. SEVERITY: "How bad is the cough today?"      Day and night severe  3. SPUTUM: "Describe the color of your sputum" (none, dry cough; clear, white, yellow, green)     Lumps tan colored  4. HEMOPTYSIS: "Are you coughing up any blood?" If so ask: "How much?" (flecks, streaks, tablespoons, etc.)     None  5. DIFFICULTY BREATHING: "Are you having difficulty breathing?" If Yes, ask: "How bad is it?" (e.g., mild, moderate, severe)    - MILD: No SOB at rest, mild SOB with walking, speaks normally in sentences, can lie down, no retractions, pulse < 100.    - MODERATE: SOB at rest, SOB with minimal exertion and prefers to sit, cannot lie down flat, speaks in phrases, mild retractions, audible wheezing, pulse 100-120.    - SEVERE: Very SOB at rest, speaks in single words, struggling to breathe, sitting hunched forward, retractions, pulse > 120      Coughing worsens shortness of breath  6. FEVER: "Do you have a fever?" If Yes, ask: "What is your temperature, how was it measured, and when did it start?"     None  8. LUNG HISTORY: "Do you have any history of lung  disease?"  (e.g., pulmonary embolus, asthma, emphysema)     Bronchiectasis without acute exacerbation  10. OTHER SYMPTOMS: "Do you have any other symptoms?" (e.g., runny nose, wheezing, chest pain)       None  Protocols used: Cough - Acute Productive-A-AH

## 2023-11-27 NOTE — Progress Notes (Unsigned)
 Subjective:    Patient ID: Carla [name], female    DOB: 1926/04/02, 89 y.o.   MRN: [id number]  Synopsis: former patient of Dr. Shelle [name] with bronchiectasis Dr. Shelle [name] summarized her case as follows: CT chest 04/2014:  Bronchiectasis with nodules and tree in bud  Carla [name] 06/2014:  No obstruction by flows, but flow volume loop suggests obstruction CT chest 04/2014:  Multiple nodules with 11mm in RLL.  Chest CT 10/2014:  RLL nodule decreased to 7mm, other small nodules most c/w ?MAC 2017 sputum culture pseudomonas 2017 AFB sputum culture positive for MAI 1   01/09/2017 acute extended ov/Carla [name] re: flare of sob/wheeze on streverdi and gerd rx  Chief Complaint  Patient presents with   Acute Visit    Pt c/o increased SOB and wheezing for the past month.  She is coughing more, but producing less sputum, cream in color.   on steverdi  Already rx with levaquin and no better, assoc with nasal congestion / poor hfa use and doesn't find it particularly effective when she tries it.  Cough is worse at hs, but  s excess/ purulent sputum or mucus plugs or hemoptysis Doe = MMRC2 = can't walk a nl pace on a flat grade s sob but does fine slow and flat     11/27/2023 ACUTE  ov/Carla [name] re: bronchiectasis  maint on trelegy   Chief Complaint  Patient presents with   Cough    X1.5weeks, productive, denies fever/chills/other symptoms; taking Mucinex   Dyspnea:  worse with flare / Cough: moca collored/ worse than usual x 10 day / mucinex 1200mg   plus otc  Sleeping: *** resp cc  SABA use: bed is flat on side  02: none  On omeprazole 40 mg      No obvious day to day or daytime variability or assoc excess/ purulent sputum or mucus plugs or hemoptysis or cp or chest tightness, subjective wheeze or overt sinus or hb symptoms.    Also denies any obvious fluctuation of symptoms with weather or environmental changes or other aggravating or alleviating factors except as outlined above   No unusual exposure hx or h/o  childhood pna/ asthma or knowledge of premature birth.  Current Allergies, Complete Past Medical History, Past Surgical History, Family History, and Social History were reviewed in [employer] record.  ROS  The following are not active complaints unless bolded Hoarseness, sore throat, dysphagia, dental problems, itching, sneezing,  nasal congestion or discharge of excess mucus or purulent secretions, ear ache,   fever, chills, sweats, unintended wt loss or wt gain, classically pleuritic or exertional cp,  orthopnea pnd or arm/hand swelling  or leg swelling, presyncope, palpitations, abdominal pain, anorexia, nausea, vomiting, diarrhea  or change in bowel habits or change in bladder habits, change in stools or change in urine, dysuria, hematuria,  rash, arthralgias, visual complaints, headache, numbness, weakness or ataxia or problems with walking or coordination,  change in mood or  memory.        Current Meds  Medication Sig   aspirin EC 81 MG tablet Take 81 mg by mouth daily.   Biotin 10 MG TABS Take 10 mg by mouth daily.   cholecalciferol (VITAMIN D3) 25 MCG (1000 UNIT) tablet Take 1,000 Units by mouth daily.   Dextromethorphan-guaiFENesin (MUCINEX DM) 30-600 MG TB12 TAKE 1 TABLET TWICE DAILY   diazepam (VALIUM) 5 MG tablet Take 5 mg by mouth 2 (two) times daily as needed.   diclofenac sodium (VOLTAREN) 1 %  GEL Apply 2 g topically as needed (pain).    Fluticasone-Umeclidin-Vilant (TRELEGY ELLIPTA) 200-62.5-25 MCG/ACT AEPB Inhale 1 puff into the lungs daily.   losartan (COZAAR) 50 MG tablet Take 1 tablet (50 mg total) by mouth daily.   melatonin 1 MG TABS tablet Take 1 mg by mouth at bedtime.   omeprazole (PRILOSEC) 40 MG capsule Take 40 mg by mouth 2 (two) times daily.   vitamin B-12 (CYANOCOBALAMIN) 100 MCG tablet Take 100 mcg by mouth daily.   vitamin E 1000 UNIT capsule Take 1,000 Units by mouth daily.                 Objective:   Wts   11/27/2023           ***   01/09/17 131 lb 12.8 oz (59.8 kg)  01/09/17 132 lb 1.6 oz (59.9 kg)  11/29/16 134 lb (60.8 kg)    Vital signs reviewed  11/27/2023  - Note at rest 02 sats  ***% on ***   General appearance:    ***                Assessment & Plan:

## 2023-11-27 NOTE — Patient Instructions (Addendum)
 Omeprazole 40 mg Take 30- 60 min before your first and last meals of the day   GERD (REFLUX)  is an extremely common cause of respiratory symptoms just like yours , many times with no obvious heartburn at all.    It can be treated with medication, but also with lifestyle changes including elevation of the head of your bed (ideally with 6 -8inch blocks under the headboard of your bed),  Smoking cessation, avoidance of late meals, excessive alcohol, and avoid fatty foods, chocolate, peppermint, colas, red wine, and acidic juices such as orange juice.  NO MINT OR MENTHOL PRODUCTS SO NO COUGH DROPS - Luden's ok  USE SUGARLESS CANDY INSTEAD (Jolley ranchers or Stover's or Life Savers) or even ice chips will also do - the key is to swallow to prevent all throat clearing. NO OIL BASED VITAMINS - use powdered substitutes.  Avoid fish oil when coughing.    Cipro 500 mg twice daily x 10 days    Please remember to go to the  x-ray department  for your tests - we will call you with the results when they are available     [place] / 6 months sooner if needed

## 2023-11-28 DIAGNOSIS — J471 Bronchiectasis with (acute) exacerbation: Secondary | ICD-10-CM | POA: Insufficient documentation

## 2023-11-28 NOTE — Assessment & Plan Note (Addendum)
 Onset of sympoms around 2010 CT chest 04/2014:  Bronchiectasis with nodules and tree in bud  Carla [name] 06/2014:  No obstruction by flows, but flow volume loop suggests obstruction 2017 sputum culture> pseudomonas aeruginosa 2017 AFB sputum culture positive for MAI 1  Rec Cipro 500 mg bid x 10 days Max gerd rx to prevent cyclical coughing, at least during acute flare than ok to drop back to whatever maint rx she was doing prior  F/u by Dr Judeth Horn planned   Each maintenance medication was reviewed in detail including emphasizing most importantly the difference between maintenance and prns and under what circumstances the prns are to be triggered using an action plan format where appropriate.  Total time for H and P, chart review, counseling, reviewing and generating customized AVS unique to this ACUTE office visit / same day charting = 21 min

## 2024-02-14 DIAGNOSIS — C50911 Malignant neoplasm of unspecified site of right female breast: Secondary | ICD-10-CM | POA: Diagnosis not present

## 2024-03-07 IMAGING — CT CT CHEST W/O CM
2 of 3 series · 15 of 36 positions shown, 18 images · non-contrast
Comparison: X-ray 01/20/2022, CT 11/05/2014.  X-ray 11/22/2021

CLINICAL DATA: Pneumonia, complication suspected, xray done



[Series 2: thorax · axial · 0.64mm/px · z∈[+1357,+1625]mm · 12 of 158 slices shown, 15 images]
[im 12/158  mediastinal]
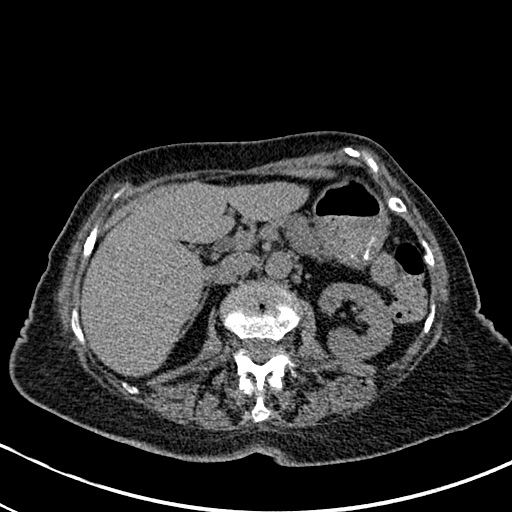
[im 12/158  lung]
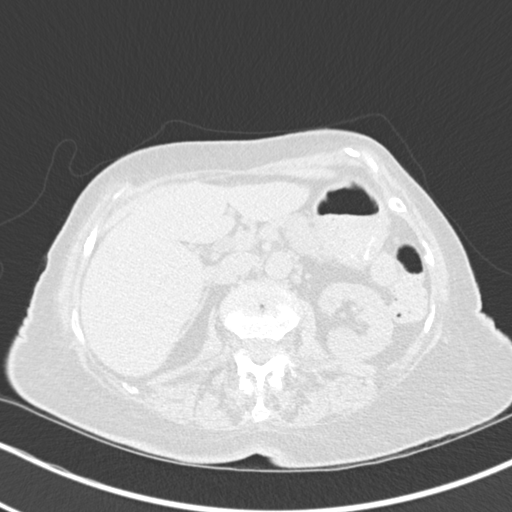
[im 24/158  lung]
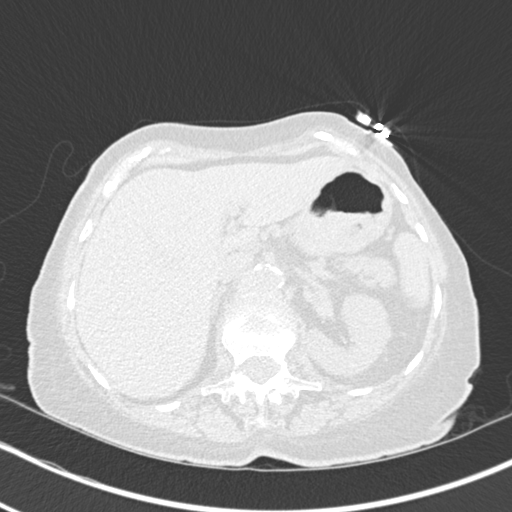
[im 35/158  lung]
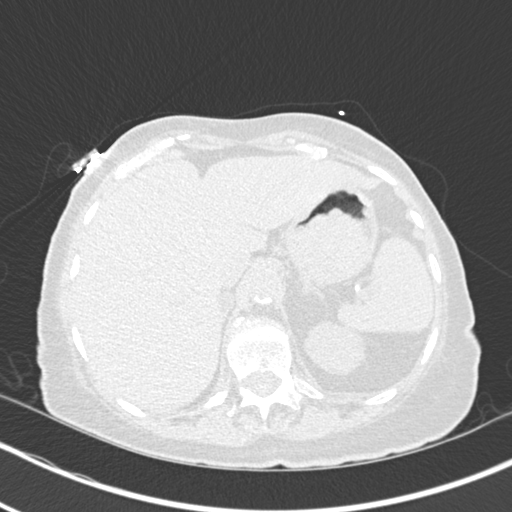
[im 47/158  lung]
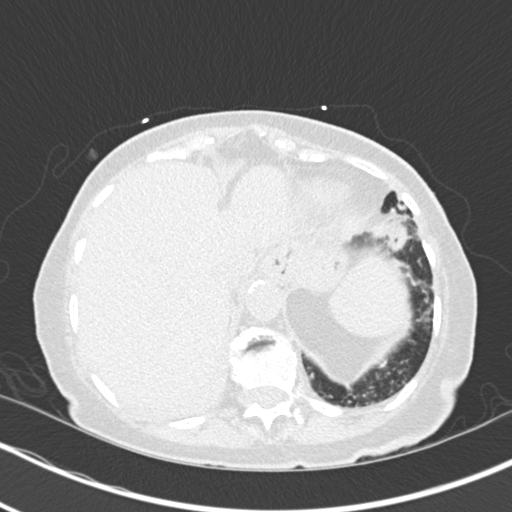
[im 59/158  mediastinal]
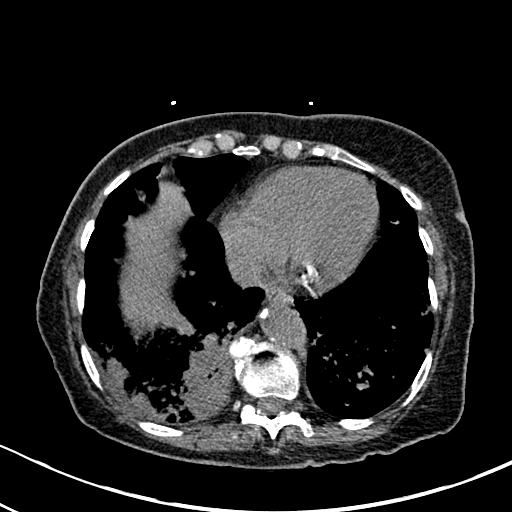
[im 59/158  lung]
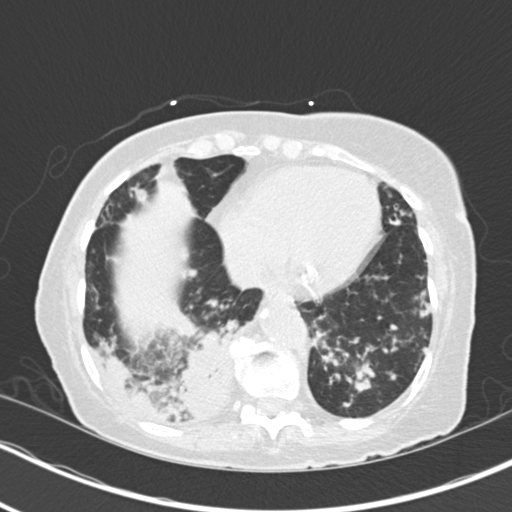
[im 70/158  lung]
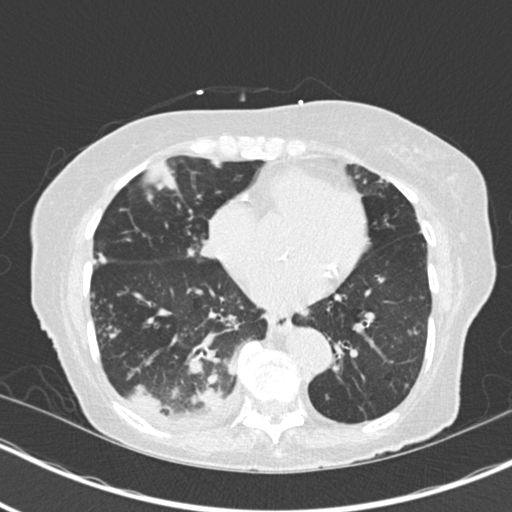
[im 88/158  lung]
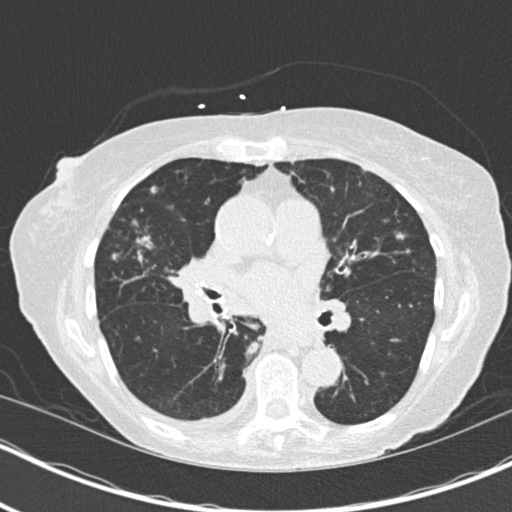
[im 99/158  lung]
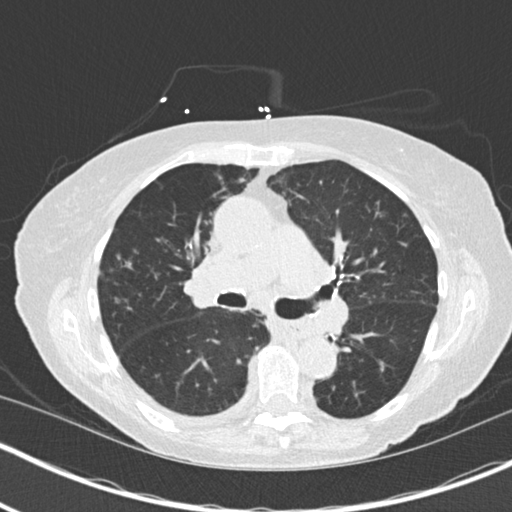
[im 111/158  mediastinal]
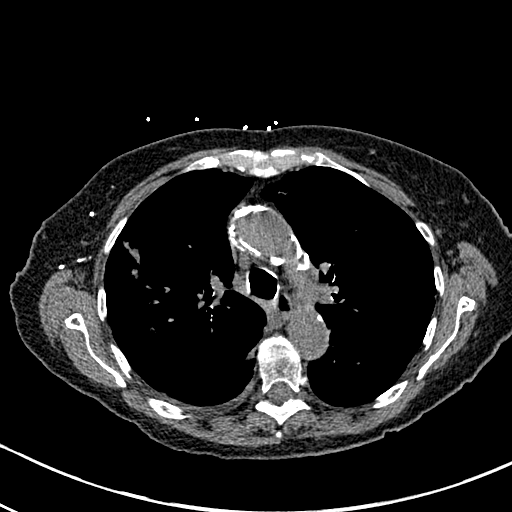
[im 111/158  lung]
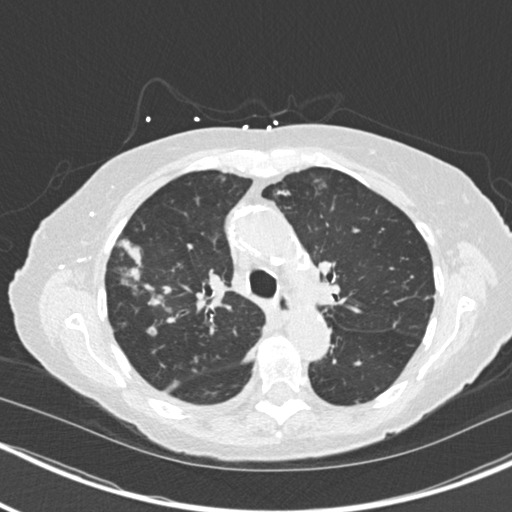
[im 123/158  lung]
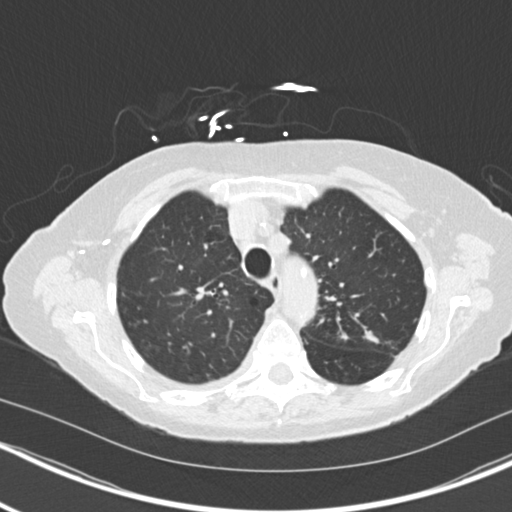
[im 134/158  lung]
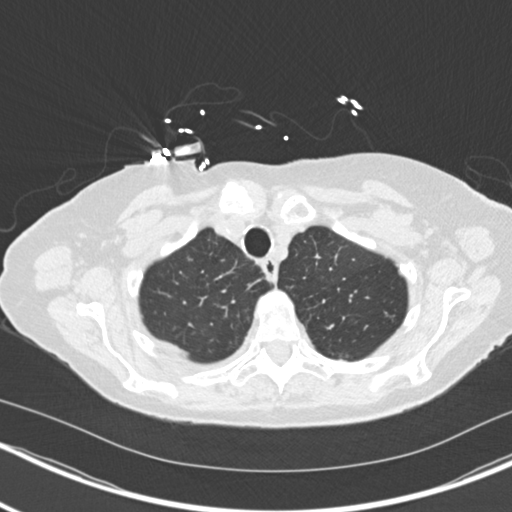
[im 146/158  lung]
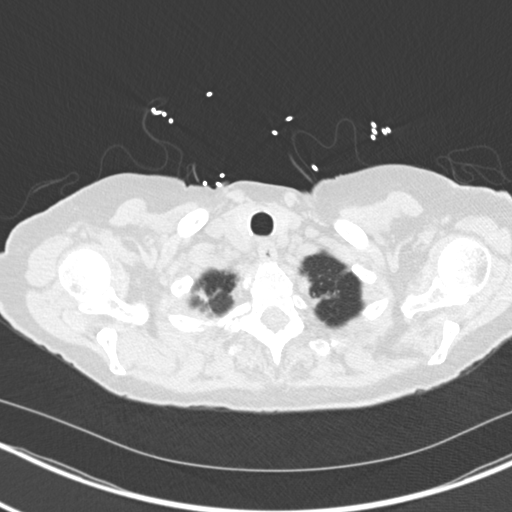

[Series 4: coronal · coronal · 0.58mm/px · 3 of 118 slices shown]
[im 24/118  lung]
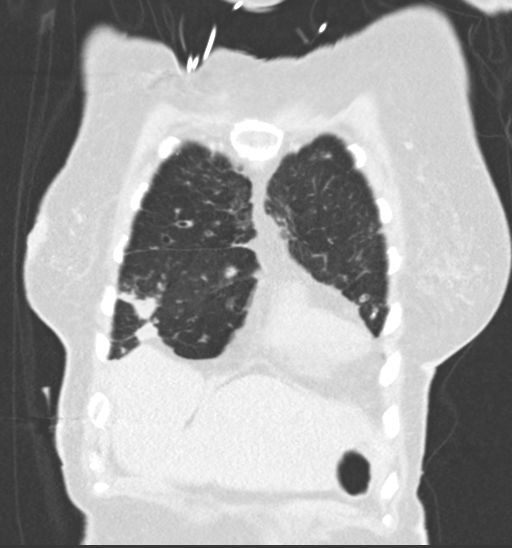
[im 47/118  lung]
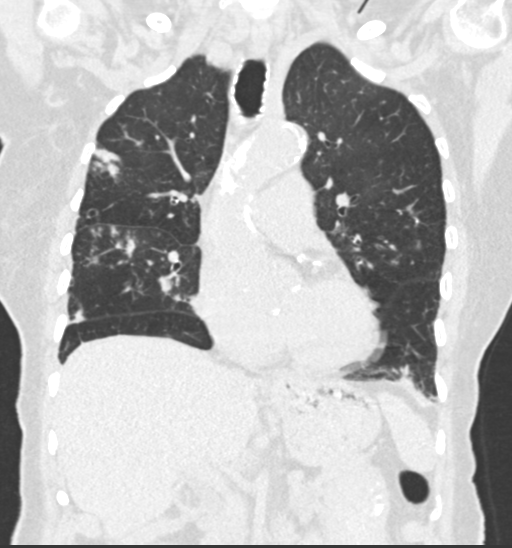
[im 71/118  lung]
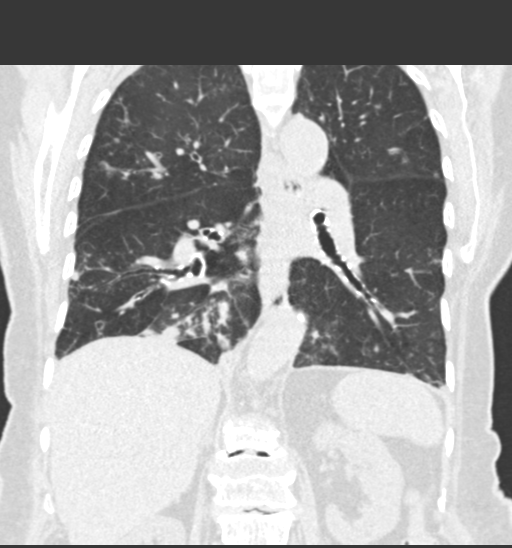

[15 of 36 positions shown; findings below may reference images not displayed]

FINDINGS: Cardiovascular: Heart size is normal. No pericardial effusion.
Thoracic aorta is nonaneurysmal. Atherosclerotic calcifications of
the aorta and coronary arteries.

Mediastinum/Nodes: Postsurgical changes in the right axilla. No
axillary lymphadenopathy. No enlarged mediastinal lymph nodes.
Evaluation of the hilar structures is limited in the absence of
intravenous contrast. Within this limitation, no obvious hilar
adenopathy or mass is identified. Trachea, thyroid gland, and
esophagus demonstrate no significant findings.

Lungs/Pleura: Dense airspace opacity within the posterior basal
segment of the right lower lobe with air bronchograms. Multiple
additional areas of patchy opacity and nodularity throughout both
lungs. Mild diffuse bronchial wall thickening with areas of
bronchiectasis. Trace right pleural effusion. No pneumothorax.

Upper Abdomen: No acute abnormality.

Musculoskeletal: Severe superior endplate compression fracture of L1
with retropulsion of the superior endplate. Mild-moderate superior
endplate compression fractures of L2 and L3. These appear grossly
similar to previous lumbar spine radiographs from 11/22/2021. No
definite new or acute bony findings. No chest wall abnormality.
IMPRESSION: 1. Dense right lower lobe pneumonia. Multiple additional areas of
patchy opacity and nodularity throughout both lungs. Findings are
favored to represent multifocal pneumonia. Follow-up to resolution
is recommended.
2. Trace right pleural effusion.
3. Chronic appearing compression fractures of L1, L2, and L3. These
appear grossly similar to previous lumbar spine radiographs from
11/22/2021. No definite new or acute bony findings.
4. Aortic and coronary artery atherosclerosis ([id number]).

## 2024-03-11 DIAGNOSIS — C50611 Malignant neoplasm of axillary tail of right female breast: Secondary | ICD-10-CM | POA: Diagnosis not present

## 2024-03-13 ENCOUNTER — Other Ambulatory Visit: Payer: Self-pay | Admitting: Surgical Oncology

## 2024-03-13 DIAGNOSIS — Z853 Personal history of malignant neoplasm of breast: Secondary | ICD-10-CM

## 2024-03-13 DIAGNOSIS — N6310 Unspecified lump in the right breast, unspecified quadrant: Secondary | ICD-10-CM

## 2024-03-15 ENCOUNTER — Ambulatory Visit
Admission: RE | Admit: 2024-03-15 | Discharge: 2024-03-15 | Disposition: A | Discharge status: Home / Self Care | Source: Ambulatory Visit | Attending: Surgical Oncology | Admitting: Surgical Oncology

## 2024-03-15 ENCOUNTER — Other Ambulatory Visit: Payer: Self-pay | Admitting: Surgical Oncology

## 2024-03-15 DIAGNOSIS — Z853 Personal history of malignant neoplasm of breast: Secondary | ICD-10-CM

## 2024-03-15 DIAGNOSIS — N6310 Unspecified lump in the right breast, unspecified quadrant: Secondary | ICD-10-CM

## 2024-03-15 DIAGNOSIS — N6311 Unspecified lump in the right breast, upper outer quadrant: Secondary | ICD-10-CM | POA: Diagnosis not present

## 2024-03-15 DIAGNOSIS — R928 Other abnormal and inconclusive findings on diagnostic imaging of breast: Secondary | ICD-10-CM

## 2024-03-15 DIAGNOSIS — Z170 Estrogen receptor positive status [ER+]: Secondary | ICD-10-CM | POA: Diagnosis not present

## 2024-03-15 DIAGNOSIS — C50411 Malignant neoplasm of upper-outer quadrant of right female breast: Secondary | ICD-10-CM | POA: Diagnosis not present

## 2024-03-15 DIAGNOSIS — R590 Localized enlarged lymph nodes: Secondary | ICD-10-CM | POA: Diagnosis not present

## 2024-03-15 HISTORY — PX: BREAST BIOPSY: SHX20

## 2024-03-18 LAB — SURGICAL PATHOLOGY

## 2024-03-26 ENCOUNTER — Ambulatory Visit: Payer: Self-pay

## 2024-03-26 NOTE — Telephone Encounter (Signed)
 FYI Only or Action Required?: FYI only for provider.  Patient is followed in Pulmonology for nodules, last seen on 11/27/2023 by Darlean Ozell NOVAK, MD. Called Nurse Triage reporting Cough. Symptoms began several weeks ago. Interventions attempted: OTC medications: Mucinex , Rescue inhaler, and Nebulizer treatments. Symptoms are: unchanged.  Triage Disposition: See Physician Within 24 Hours  Patient/caregiver understands and will follow disposition?: Yes   Copied from CRM [id number]. Topic: Clinical - Red Word Triage >> Mar 26, 2024  8:37 AM Corean SAUNDERS wrote: Red Word that prompted transfer to Nurse Triage: Discolored mucus - Beige - daughter Zebedee speaking. Reason for Disposition  [1] Known COPD or other severe lung disease (i.e., bronchiectasis, cystic fibrosis, lung surgery) AND [2] worsening symptoms (i.e., increased sputum purulence or amount, increased breathing difficulty  Answer Assessment - Initial Assessment Questions E2C2 Pulmonary Triage - Initial Assessment Questions Chief Complaint (e.g., cough, sob, wheezing, fever, chills, sweat or additional symptoms) *Go to specific symptom protocol after initial questions. Productive beige cough - worse at night Of note, recent dx of breast cancer-- and has consult Monday  How long have symptoms been present? 2 weeks  Have you tested for COVID or Flu? Note: If not, ask patient if a home test can be taken. If so, instruct patient to call back for positive results. No  MEDICINES:   Have you used any OTC meds to help with symptoms? Yes If yes, ask What medications? Mucinex  daily  Have you used your inhalers/maintenance medication? Yes If yes, What medications? Trelegy Albuterol  NEB - started using last night  If inhaler, ask How many puffs and how often? Note: Review instructions on medication in the chart. As prescribed  OXYGEN: Do you wear supplemental oxygen? No If yes, How many liters are you supposed to  use? N/a  Do you monitor your oxygen levels? No If yes, What is your reading (oxygen level) today? N/a  What is your usual oxygen saturation reading?  (Note: Pulmonary O2 sats should be 90% or greater) N/a     1. ONSET: When did the cough begin?      See above 2. SEVERITY: How bad is the cough today?      See above 3. SPUTUM: Describe the color of your sputum (none, dry cough; clear, white, yellow, green)     beige 4. HEMOPTYSIS: Are you coughing up any blood? If so ask: How much? (flecks, streaks, tablespoons, etc.)     denies 5. DIFFICULTY BREATHING: Are you having difficulty breathing? If Yes, ask: How bad is it? (e.g., mild, moderate, severe)    - MILD: No SOB at rest, mild SOB with walking, speaks normally in sentences, can lie down, no retractions, pulse < 100.    - MODERATE: SOB at rest, SOB with minimal exertion and prefers to sit, cannot lie down flat, speaks in phrases, mild retractions, audible wheezing, pulse 100-120.    - SEVERE: Very SOB at rest, speaks in single words, struggling to breathe, sitting hunched forward, retractions, pulse > 120      SOB with coughing, worse at night 6. FEVER: Do you have a fever? If Yes, ask: What is your temperature, how was it measured, and when did it start?     denies 7. CARDIAC HISTORY: Do you have any history of heart disease? (e.g., heart attack, congestive heart failure)      denies 8. LUNG HISTORY: Do you have any history of lung disease?  (e.g., pulmonary embolus, asthma, emphysema)     Asthma,  nodules 9. PE RISK FACTORS: Do you have a history of blood clots? (or: recent major surgery, recent prolonged travel, bedridden)     denies 10. OTHER SYMPTOMS: Do you have any other symptoms? (e.g., runny nose, wheezing, chest pain)       Denies, just hoarseness 11. PREGNANCY: Is there any chance you are pregnant? When was your last menstrual period?       N/a 12. TRAVEL: Have you traveled out of  the country in the last month? (e.g., travel history, exposures)       N/a  Protocols used: Cough - Acute Productive-A-AH

## 2024-03-26 NOTE — Telephone Encounter (Signed)
 Looks like appt was scheduled to see me tomorrow.

## 2024-03-26 NOTE — Telephone Encounter (Signed)
 Please advise Dr. Judeth Horn

## 2024-03-27 ENCOUNTER — Ambulatory Visit (INDEPENDENT_AMBULATORY_CARE_PROVIDER_SITE_OTHER)

## 2024-03-27 ENCOUNTER — Encounter: Payer: Self-pay | Admitting: Pulmonary Disease

## 2024-03-27 ENCOUNTER — Ambulatory Visit (INDEPENDENT_AMBULATORY_CARE_PROVIDER_SITE_OTHER): Admitting: Pulmonary Disease

## 2024-03-27 VITALS — BP 129/75 | HR 74 | Ht 60.0 in | Wt 110.0 lb

## 2024-03-27 DIAGNOSIS — R918 Other nonspecific abnormal finding of lung field: Secondary | ICD-10-CM | POA: Diagnosis not present

## 2024-03-27 DIAGNOSIS — J471 Bronchiectasis with (acute) exacerbation: Secondary | ICD-10-CM | POA: Diagnosis not present

## 2024-03-27 DIAGNOSIS — R059 Cough, unspecified: Secondary | ICD-10-CM | POA: Diagnosis not present

## 2024-03-27 MED ORDER — AMOXICILLIN-POT CLAVULANATE 875-125 MG PO TABS: 1.0000 | ORAL_TABLET | Freq: Two times a day (BID) | ORAL | 0 refills | Status: AC

## 2024-03-27 NOTE — Addendum Note (Signed)
 Addended by: ARMAND BURNARD SAUNDERS on: 03/27/2024 02:53 PM   Modules accepted: Orders

## 2024-03-27 NOTE — Patient Instructions (Signed)
 I think the cough is related to overgrowth of bacteria causing mucus that we can often see with bronchiectasis like you have  Take Augmentin  1 tablet twice a day for 10 days  No other changes to medication  Will get a chest x-ray today out of an abundance of caution  Return to clinic in 3 months or sooner as needed with Dr. Annella
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
## 2024-03-28 ENCOUNTER — Ambulatory Visit: Payer: Self-pay | Admitting: Pulmonary Disease

## 2024-03-28 NOTE — Progress Notes (Signed)
 Chest x-ray with chronic findings, nothing new or concerning such as pneumonia etc.

## 2024-03-28 NOTE — Telephone Encounter (Signed)
 Pt and daughter verbalized understanding    NFN

## 2024-04-01 ENCOUNTER — Other Ambulatory Visit: Payer: Self-pay | Admitting: General Surgery

## 2024-04-01 DIAGNOSIS — C50411 Malignant neoplasm of upper-outer quadrant of right female breast: Secondary | ICD-10-CM

## 2024-04-01 DIAGNOSIS — J479 Bronchiectasis, uncomplicated: Secondary | ICD-10-CM | POA: Diagnosis not present

## 2024-04-01 DIAGNOSIS — Z1589 Genetic susceptibility to other disease: Secondary | ICD-10-CM | POA: Diagnosis not present

## 2024-04-01 DIAGNOSIS — Z170 Estrogen receptor positive status [ER+]: Secondary | ICD-10-CM | POA: Diagnosis not present

## 2024-04-01 NOTE — H&P (View-Only) (Signed)
 REFERRING PHYSICIAN:  Arceo  PROVIDER:  FAERA LEDFORD BYERLY, MD  Care Team: Patient Care Team: Larnell Glendia CROME, MD as PCP - General (Nephrology) Hunsucker, Donnice Motto, MD as Referring Physician (Pulmonary Disease) Madison Selinda BIRCH, MD as Referring Physician (Hematology and Oncology)   MRN: [id number] DOB: 31-Dec-1925 DATE OF ENCOUNTER: 04/01/2024  Subjective   Chief Complaint: No chief complaint on file.     History of Present Illness: Carla [name] is a 89 y.o. female who is seen today as an office consultation at the request of Dr. Moira for evaluation of No chief complaint on file.  Patient presents with a new diagnosis of right breast cancer June 2025.  Of note, she also has a history of right breast cancer in 2017 and in 2024.  The most recent breast cancer presents with an area of palpable concern at 11:00.  Diagnostic imaging was performed.  On mammogram there were seen to be 2 adjacent oval density masses with indistinct margins with total span of 3.2 cm.  On ultrasound, there was a hypoechoic mass that was 10 mm.  At 11:00 4 cm from the nipple there were 2 additional masses that were 4 mm and 5 mm for a total of 2.3 cm.  There was a lymph node with focal cortical thickening on the right measuring up to 3 mm.  She underwent biopsy of the lymph node in the 2 masses at furthest distance from each other.  These were both grade 2 invasive mammary carcinomas of the ductal type.  They were ER and PR positive, HER2 was negative, Ki-67 was 10%.  The clips were 2.7 cm apart.   Looking back at the other breast cancers, in 2017 there was a grade 2 invasive ductal carcinoma that was T1b N0 that was lumpectomy with no radiation.  This was ER/PR positive, HER2 negative, Ki-67 20%.  Tamoxifen  was attempted but she had severe hot flashes and discontinued it..  Cancer in 2017 was taken care of here. She did well until 2024.  She then had a screening detected mass which was a T1c NX breast  cancer that was ER and PR positive HER2 negative, 2+ , Ki-67 25% FISH negative.  Margins were negative in 2024. Dr. Moira took care of this cancer.  She did not get radiation for either of these.  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  Also, patient has a history of mild Mycobacterium Avium colonization and bronchiectasis.   Family cancer history -sister with breast cancer, brother with prostate cancer, daughter with breast cancer, niece with breast cancer Menopause -hysterectomy age 59 Parity -P3  Hobbies include painting and cooking    Diagnostic mammogram/ultrasound: [facility] 03/11/24 FINDINGS:  Mammographically, in the area of palpable concern, there is an oval equal density mass with indistinct margins in the upper outer quadrant of the right breast, middle depth. This is located superficial to the post-surgical changes.  Additionally, in the upper outer quadrant of the right breast, posterior depth, there two adjacent oval equal density masses with indistinct margins. The total span of the findings measures approximately 3.2 cm.  On physical examination, a firm nonmobile mass is palpated at the lumpectomy site.  On targeted ultrasound, at 11:00 3 cm from the nipple, there is an oval hypoechoic mass with indistinct margins corresponding with the area of palpable concern. This measures 10 x 6 x 6 mm.  At 11:00 4 cm from the nipple, there are two additional adjacent round hypoechoic masses with  circumscribed margins. The smaller mass (annotated as mass #2) measures 4 x 3 x 3 mm. The larger mass (annotated as mass #3) measures 5 x 4 x 4mm. All three masses are demonstrated on an oblique view, spanning a total of 2.3 cm.  In the right axilla, there is a lymph node with focal cortical thickening measuring up to 3mm.  CONCLUSION:  Highly suspicious right breast mass corresponding to the area of palpable concern at 11:00 3 cm from the nipple. Additional suspicious masses are seen more posteriorly,  total span of suspected disease measures 2.3 cm sonographically.  Abnormal right axillary lymph node.  Breast composition: There are scattered areas of fibroglandular density.  BI-RADS Category: 5 - Highly suggestive of malignancy. Appropriate action should be taken.  RECOMMENDATION: Biopsy - recommend ultrasound guided biopsy of the largest right breast mass at 11:00 3 cm from the nipple and ultrasound guided biopsy of the abnormal right axillary lymph node. If the pathology for the 11:00 mass is negative, additional biopsy of one of the smaller round masses should be considered.    Pathology core needle biopsy: 03/15/2024 1. Breast, right, needle core biopsy, 11:00 3 cmfn :      - INVASIVE MAMMARY CARCINOMA, NO SPECIAL TYPE (DUCTAL).      - TUBULE FORMATION: SCORE 3      - NUCLEAR PLEOMORPHISM: SCORE 2      - MITOTIC COUNT: SCORE 1      - TOTAL SCORE: 6      - OVERALL GRADE: 2      - LYMPHOVASCULAR INVASION: NOT IDENTIFIED      - CANCER LENGTH: 10 MM      - CALCIFICATIONS: NOT IDENTIFIED      - DUCTAL CARCINOMA IN SITU: NOT IDENTIFIED      - SEE NOTE.       2. Breast, right, needle core biopsy, 11:00 4 cmfn :      - INVASIVE MAMMARY CARCINOMA, NO SPECIAL TYPE (DUCTAL).      - TUBULE FORMATION: SCORE 3      - NUCLEAR PLEOMORPHISM: SCORE 2      - MITOTIC COUNT: SCORE 1      - TOTAL SCORE: 6      - OVERALL GRADE: 2      - LYMPHOVASCULAR INVASION: NOT IDENTIFIED      - CANCER LENGTH: 5 MM      - CALCIFICATIONS: NOT IDENTIFIED      - DUCTAL CARCINOMA IN SITU: NOT IDENTIFIED      - SEE NOTE.       3. Lymph node, needle/core biopsy, Rt axilla :      - LYMPH NODE TISSUE, NEGATIVE FOR MALIGNANCY.   Receptors: The tumor cells are NEGATIVE for Her2 (1+).  Estrogen Receptor:  100%, POSITIVE, STRONG STAINING INTENSITY  Progesterone Receptor:  70%, POSITIVE, WEAK- MODERATE  STAINING INTENSITY  Proliferation Marker Ki67:  10%    Review of Systems: A complete review of systems was  obtained from the patient.  I have reviewed this information and discussed as appropriate with the patient.  See HPI as well for other ROS. ROS -positive for hearing loss, nasal congestion, visual disturbance, cough, sputum production, wheezing, heartburn, diarrhea, easy bruising, and anxiety,    Medical History: Past Medical History:  Diagnosis Date  . Anemia   . Anxiety   . Arthritis   . GERD (gastroesophageal reflux disease)   . History of cancer     Patient Active Problem  List  Diagnosis  . Malignant neoplasm of upper-outer quadrant of right breast in female, estrogen receptor positive (CMS/HHS-HCC)  . Bronchiolectasis (CMS/HHS-HCC)  . CHEK2 positive    Past Surgical History:  Procedure Laterality Date  . JOINT REPLACEMENT       Allergies  Allergen Reactions  . Azithromycin Other (See Comments)    MAC patient, please avoid azithromycin and other medicines in class and choose other amti-microbial as indicated    Current Outpatient Medications on File Prior to Visit  Medication Sig Dispense Refill  . amoxicillin -clavulanate (AUGMENTIN ) 875-125 mg tablet Take 1 tablet by mouth 2 (two) times daily    . aspirin  81 MG EC tablet Take 81 mg by mouth once daily    . biotin  5,000 mcg Chew Take 1 tablet by mouth once daily    . cetirizine (ZYRTEC) 10 MG tablet Take 10 mg by mouth once daily    . cholecalciferol (VITAMIN D3) 1000 unit tablet Take 1,000 Units by mouth once daily    . cyanocobalamin, vitamin B-12, 5,000 mcg Chew Take 1 tablet by mouth once daily    . fluticasone -umeclidinium-vilanterol (TRELEGY ELLIPTA ) 200-62.5-25 mcg inhaler Inhale 1 Puff into the lungs once daily    . Lactobacillus acidophilus (ACIDOPHILUS ORAL) Take 1 tablet by mouth once daily    . losartan  (COZAAR ) 50 MG tablet Take 50 mg by mouth once daily    . multivitamin with minerals, EYE, (PRESERVISION AREDS 2) soft gel capsule Take 2 capsules by mouth once daily    . omeprazole  (PRILOSEC) 20 MG DR  capsule Take 20 mg by mouth 2 (two) times daily before meals    . potassium citrate 99 mg Cap Take 1 tablet by mouth once daily    . vitamin E 400 unit capsule Take 400 Units by mouth once daily     No current facility-administered medications on file prior to visit.    Family History  Problem Relation Age of Onset  . High blood pressure (Hypertension) Mother   . High blood pressure (Hypertension) Father   . Breast cancer Sister   . Breast cancer Sister   . Deep vein thrombosis (DVT or abnormal blood clot formation) Brother   . Stroke Brother      Social History   Tobacco Use  Smoking Status Never  . Passive exposure: Never  Smokeless Tobacco Never     Social History   Socioeconomic History  . Marital status: Divorced  Tobacco Use  . Smoking status: Never    Passive exposure: Never  . Smokeless tobacco: Never  Vaping Use  . Vaping status: Never Used  Substance and Sexual Activity  . Alcohol  use: Never  . Drug use: Never   Social Drivers of Health   Food Insecurity: Low Risk  (08/29/2023)   Received from [facility]   Hunger Vital Sign   . Within the past 12 months, you worried that your food would run out before you got money to buy more: Never true   . Within the past 12 months, the food you bought just didn't last and you didn't have money to get more. : Never true  Transportation Needs: No Transportation Needs (08/29/2023)   Received from [facility]   . In the past 12 months, has lack of reliable transportation kept you from medical appointments, meetings, work or from getting things needed for daily living? : No  Housing Stability: Unknown (04/01/2024)   Housing Stability Vital Sign   .  Homeless in the Last Year: No    Objective:    Vitals:   04/01/24 1402  BP: 135/74  Pulse: 92  Temp: 36.7 C (98 F)  TempSrc: Oral  SpO2: 93%  Weight: 50.5 kg (111 lb 6.4 oz)  Height: 152.4 cm (5')  PainSc: 0-No pain    Body mass index is  21.76 kg/m.   Gen:  No acute distress.  Well nourished and well groomed.   Neurological: Alert and oriented to person, place, and time. Coordination normal.  Head: Normocephalic and atraumatic.  Eyes: Conjunctivae are normal. Pupils are equal, round, and reactive to light. No scleral icterus.  Neck: Normal range of motion. Neck supple. No tracheal deviation or thyromegaly present.  Cardiovascular: Normal rate, regular rhythm, normal heart sounds and intact distal pulses.  Exam reveals no gallop and no friction rub.  No murmur heard. Breast: Right breast smaller than left.  Transverse incision in the upper outer right breast.  Firm region in this scar, but not discrete mass.  No nipple retraction.  No dramatic contour change.  No lymphadenopathy. Respiratory: Effort normal.  No respiratory distress. No chest wall tenderness. Breath sounds normal.  No wheezes, rales or rhonchi.  GI: Soft. Bowel sounds are normal. The abdomen is soft and nontender.  There is no rebound and no guarding.  Musculoskeletal: Normal range of motion. Extremities are nontender.  Lymphadenopathy: No cervical, preauricular, postauricular or axillary adenopathy is present Skin: Skin is warm and dry. No rash noted. No diaphoresis. No erythema. No pallor. No clubbing, cyanosis, or edema.  Bruising left upper arm with abrasion. Psychiatric: Normal mood and affect. Behavior is normal. Judgment and thought content normal.    Labs N/a  Assessment and Plan:     ICD-10-CM   1. Malignant neoplasm of upper-outer quadrant of right breast in female, estrogen receptor positive (CMS/HHS-HCC)  C50.411    Z17.0     2. CHEK2 positive  Z15.89     3. Bronchiolectasis (CMS/HHS-HCC)  J47.9      Patient has new diagnosis of right breast cancer that is hormone positive and HER2 negative.  It is unclear whether this is definitive recurrence or a new right breast cancer given that she is CHEK2 positive.  Given its proximity to the  previous cancer, it is likely a recurrence.  I discussed that since this cancer is so close temporally to the last cancer in October/November 2024, that I would consider additional treatment.  It sounds like she had a hard time with tamoxifen  in 2017, but is at significant risk of osteoporosis, so I am not sure that an AI would be a good choice.  I advised her to reconsider adjuvant treatment after surgery.  I did discuss that the lymph node biopsy was concordant and that they were not findings that were of high concern for lymph node metastasis.  I advised that the only way to have more certainty would be to do a sentinel lymph node biopsy, but the risk of complications and issues with mobility would be higher than the likelihood of finding lymph node metastasis in this 89 year old.  I would like to place seeds in the 2 farthest tumors to make sure that we do get these given the scar tissue that is present.  I discussed risks of surgery including bleeding, infection, change in breast contour, continued reduction in the size of the right breast, potential chronic breast pain, breast seroma, possible positive margins, possible need for additional procedures, possible recurrent breast  cancer, possible heart or lung complications, possible blood clot.  I messaged the pulmonologist that she saw last week regarding her bronchiectasis.  I cannot auscultate any changes in her breath sounds and her chest x-ray appeared unchanged.  I also do not appreciate any congestion in her upper respiratory tract at this time, but want to make sure that pulmonary is not concerned about proceeding with surgery now.  The patient would like to try surgery with sedation if that is possible.  I am referring this question to anesthesia.  The patient does have medical problems, but has a very high functional status.  She gets along well and is quite independent.  No follow-ups on file.   Jina LITTIE Nephew, MD FACS Surgical  Oncology, General Surgery, Trauma and Critical Care [facility]

## 2024-04-05 ENCOUNTER — Telehealth: Payer: Self-pay

## 2024-04-05 ENCOUNTER — Telehealth: Payer: Self-pay | Admitting: Acute Care

## 2024-04-05 ENCOUNTER — Ambulatory Visit: Payer: Self-pay | Admitting: Pulmonary Disease

## 2024-04-05 ENCOUNTER — Other Ambulatory Visit: Payer: Self-pay | Admitting: General Surgery

## 2024-04-05 DIAGNOSIS — C50411 Malignant neoplasm of upper-outer quadrant of right female breast: Secondary | ICD-10-CM

## 2024-04-05 DIAGNOSIS — R051 Acute cough: Secondary | ICD-10-CM

## 2024-04-05 MED ORDER — PREDNISONE 10 MG PO TABS: ORAL_TABLET | ORAL | 0 refills | Status: DC

## 2024-04-05 NOTE — Telephone Encounter (Signed)
 Copied from CRM [id number]. Topic: Clinical - Order For Equipment >> Apr 05, 2024 11:11 AM Celestine FALCON wrote: Reason for CRM: [name] pt's daughter (on dmr) is calling to get an update on the replacement for the pt's nebulizer. [name] did not mention the name of the DME company. Please call [phone] with any updates.  Order placed 7/2 for neb machine.  Below I have copied [employer]'s response   Triage, please contact patients daughter, as I am in a meeting and do not have access to a phone.   Coltrane, Terri  [place], [place]; [place], Melissa L; Earls, Theba This patient currently has a nebulizer and supplies with [employer]. she will be eligible for a new one on 07/16/26. Insurance will only pay for a new one every 5 years. I will have one of our respiratory therapists give her a call to make sure everything is going ok with the one she has. SABRA

## 2024-04-05 NOTE — Telephone Encounter (Signed)
 Patient daughter is calling back because she was waiting for someone to reach out to her concerning her mom and not feeling good and coughing real bad . Sent patient daughter over to nurse triage for further assistance

## 2024-04-05 NOTE — Telephone Encounter (Signed)
 FYI Only or Action Required?: FYI only for provider.  Patient is followed in Pulmonology for Bronchiectasis , last seen on 03/27/2024 by Hunsucker, Donnice SAUNDERS, MD.  Called Nurse Triage reporting Cough.  Symptoms began several days ago.  Interventions attempted: Rescue inhaler, Maintenance inhaler, and Increased fluids/rest.  Symptoms are: gradually worsening.  Triage Disposition: See HCP Within 4 Hours (Or PCP Triage)-recommended to the ED  Patient/caregiver understands and will follow disposition?: Yes  Copied from CRM [id number]. Topic: Clinical - Red Word Triage >> Apr 05, 2024  2:59 PM Whitney O wrote: [facility] that prompted transfer to Nurse Triage: severe coughing she been this way for a little while she coughing up stuff from her lungs. They did call earlier to get some antibiotics but no one has reached out and she is still feeling bad and feeling worse . So tired from coughing Reason for Disposition  [1] MILD difficulty breathing (e.g., minimal/no SOB at rest, SOB with walking, pulse < 100) AND [2] still present when not coughing  Answer Assessment - Initial Assessment Questions 1. ONSET: When did the cough begin?      Started 2-3 weeks 2. SEVERITY: How bad is the cough today?      Patient reports that she has coughed all night 3. SPUTUM: Describe the color of your sputum (e.g., none, dry cough; clear, white, yellow, green)     productive 4. HEMOPTYSIS: Are you coughing up any blood? If Yes, ask: How much? (e.g., flecks, streaks, tablespoons, etc.)     no 5. DIFFICULTY BREATHING: Are you having difficulty breathing? If Yes, ask: How bad is it? (e.g., mild, moderate, severe)      No shortness of breath 6. FEVER: Do you have a fever? If Yes, ask: What is your temperature, how was it measured, and when did it start?     no 7. CARDIAC HISTORY: Do you have any history of heart disease? (e.g., heart attack, congestive heart failure)      no 8. LUNG HISTORY: Do  you have any history of lung disease?  (e.g., pulmonary embolus, asthma, emphysema)     Bronchiectasis  9. PE RISK FACTORS: Do you have a history of blood clots? (or: recent major surgery, recent prolonged travel, bedridden)     no 10. OTHER SYMPTOMS: Do you have any other symptoms? (e.g., runny nose, wheezing, chest pain)       no 12. TRAVEL: Have you traveled out of the country in the last month? (e.g., travel history, exposures)       No  Patient's daughter calling with concerns of continued symptoms. Reports patient is feeling worse than she did at her appointment on 7/2. Patient recommended to the ED due to PCP office being closed. Daughter verbalized understanding and would call the patient to update her.  Protocols used: Cough - Acute Productive-A-AH

## 2024-04-05 NOTE — Telephone Encounter (Signed)
 Copied from CRM [id number]. Topic: Clinical - Medical Advice >> Apr 05, 2024 11:09 AM Celestine FALCON wrote: Reason for CRM: Pt's daughter [name] KATHEE Bunker is calling (on dpr) for the pt to have a stronger antibiotic sent to her preferred pharmacy. [name] stated that the pt is feeling worse than she did on 7/2 at the clinic. Pt was treated with amoxicillin -clavulanate (AUGMENTIN ) 875-125 MG tablet sent to [facility] - [place], [place] - [address] [place] [address] Phone: [phone]  Fax: [phone].   Teresa's phone number is [phone].

## 2024-04-05 NOTE — Telephone Encounter (Signed)
 Daughter called regarding worsening cough. She was seen by Dr. Annella 03/27/2024. He prescribed a !) day course oF ANTIBIOTIC . The Augmentin  course  has been will be completed 04/06/2024.SABRA Per the daughter, who has talked to the patient by phone, patient states  she is worse with cough since the antibiotic treatment.She was told to go to the ED, but she  states she is not sick enough to go to the emergency room.  She does not have a nebulizer to use at home . She denies any fever. Patient has not checked her oxygen saturation. She is not wheezing. Her daughter is going to call and have her check her oxygen saturation.  I have called the daughter back x 2, and the call goes right to voice mail. Daughter was advised again by me to take the patient to the ED to be evaluated. Her oxygen saturation monitor is dead, and she does not know how to change the battery. I have advised ED visit again today.Daughter verbalized understanding.  Triage, please get patient in with Dr. Annella as soon as he has an opening.  I have sent in a prednisone  taper  to see if she get relief.  Daughter verbalized understanding to seek emergency care if breathing or oxygen levels get worse.   20 minutes of APP Time on this call.

## 2024-04-08 ENCOUNTER — Encounter: Payer: Self-pay | Admitting: Pulmonary Disease

## 2024-04-08 ENCOUNTER — Ambulatory Visit (INDEPENDENT_AMBULATORY_CARE_PROVIDER_SITE_OTHER): Admitting: Pulmonary Disease

## 2024-04-08 VITALS — BP 152/72 | HR 88 | Ht 60.0 in | Wt 111.0 lb

## 2024-04-08 DIAGNOSIS — J471 Bronchiectasis with (acute) exacerbation: Secondary | ICD-10-CM | POA: Diagnosis not present

## 2024-04-08 MED ORDER — PROMETHAZINE-DM 6.25-15 MG/5ML PO SYRP: 2.5000 mL | ORAL_SOLUTION | Freq: Every evening | ORAL | 0 refills | Status: DC | PRN

## 2024-04-08 MED ORDER — ALBUTEROL SULFATE HFA 108 (90 BASE) MCG/ACT IN AERS: 2.0000 | INHALATION_SPRAY | Freq: Four times a day (QID) | RESPIRATORY_TRACT | 11 refills | Status: DC | PRN

## 2024-04-08 MED ORDER — LEVOFLOXACIN 500 MG PO TABS: 500.0000 mg | ORAL_TABLET | Freq: Every day | ORAL | 0 refills | Status: DC

## 2024-04-08 MED ORDER — SODIUM CHLORIDE 3 % IN NEBU: INHALATION_SOLUTION | Freq: Two times a day (BID) | RESPIRATORY_TRACT | 12 refills | Status: AC

## 2024-04-08 MED ORDER — ALBUTEROL SULFATE (2.5 MG/3ML) 0.083% IN NEBU: INHALATION_SOLUTION | RESPIRATORY_TRACT | 12 refills | Status: DC
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
## 2024-04-08 NOTE — Patient Instructions (Signed)
 Nice to see you again  I sent refills for the albuterol  inhaler as well as albuterol  nebulizer solution as well as saline nebulizer solution to the [facility].  We sent the nebulizer solution as well as nebulizer order to [facility].  Please check to see who has contacted you.  The order did go through for the nebulizer machine.  I sent a prescription for levofloxacin , 1 tablet daily for 7 days.  See if the cough is getting better by Thursday, if no significant improvement I would start the antibiotic.  I sent cough medicine as well.  Please take this in the evening before you go to bed only.  Return to clinic in 3 months or sooner as needed with Dr. Annella

## 2024-04-08 NOTE — Telephone Encounter (Signed)
 See encounter 7/11, patient scheduled ov today with Dr. Annella

## 2024-04-09 NOTE — Telephone Encounter (Signed)
 Spoke with pts daughter she has not heard from [facility]. I told her to reach out to them and see if they can troubleshoot for her and the issues she is having with the machine. She will call them today. NFN

## 2024-04-10 NOTE — Pre-Procedure Instructions (Signed)
 Surgical Instructions   Your procedure is scheduled on April 22, 2024. Report to [facility] Main Entrance A at 6:30 A.M., then check in with the Admitting office. Any questions or running late day of surgery: call [phone]  Questions prior to your surgery date: call [phone], Monday-Friday, 8am-4pm. If you experience any cold or flu symptoms such as cough, fever, chills, shortness of breath, etc. between now and your scheduled surgery, please notify us  at the above number.     Remember:  Do not eat after midnight the night before your surgery   You may drink clear liquids until 5:30 AM the morning of your surgery.   Clear liquids allowed are: Water, Non-Citrus Juices (without pulp), Carbonated Beverages, Clear Tea (no milk, honey, etc.), Black Coffee Only (NO MILK, CREAM OR POWDERED CREAMER of any kind), and Gatorade.    Take these medicines the morning of surgery with A SIP OF WATER: albuterol  (PROVENTIL ) nebulizer solution  Fluticasone -Umeclidin-Vilant (TRELEGY ELLIPTA )  levofloxacin  (LEVAQUIN )  omeprazole  (PRILOSEC)  sodium chloride  HYPERTONIC nebulizer solution    May take these medicines IF NEEDED: albuterol  (PROVENTIL ) nebulizer solution  albuterol  (VENTOLIN  HFA) inhaler - please bring inhaler with you morning of surgery   Follow your surgeon's instructions on when to stop Aspirin .  If no instructions were given by your surgeon then you will need to call the office to get those instructions.     One week prior to surgery, STOP taking any Aleve, Naproxen, Ibuprofen, Motrin, Advil, Goody's, BC's, all herbal medications, fish oil, and non-prescription vitamins. This includes your medication: diclofenac sodium (VOLTAREN) GEL                      Do NOT Smoke (Tobacco/Vaping) for 24 hours prior to your procedure.  If you use a CPAP at night, you may bring your mask/headgear for your overnight stay.   You will be asked to remove any contacts, glasses, piercing's,  hearing aid's, dentures/partials prior to surgery. Please bring cases for these items if needed.    Patients discharged the day of surgery will not be allowed to drive home, and someone needs to stay with them for 24 hours.  SURGICAL WAITING ROOM VISITATION Patients may have no more than 2 support people in the waiting area - these visitors may rotate.   Pre-op nurse will coordinate an appropriate time for 1 ADULT support person, who may not rotate, to accompany patient in pre-op.  Children under the age of 27 must have an adult with them who is not the patient and must remain in the main waiting area with an adult.  If the patient needs to stay at the hospital during part of their recovery, the visitor guidelines for inpatient rooms apply.  Please refer to the [facility] website for the visitor guidelines for any additional information.   If you received a COVID test during your pre-op visit  it is requested that you wear a mask when out in public, stay away from anyone that may not be feeling well and notify your surgeon if you develop symptoms. If you have been in contact with anyone that has tested positive in the last 10 days please notify you surgeon.      Pre-operative CHG Bathing Instructions   You can play a key role in reducing the risk of infection after surgery. Your skin needs to be as free of germs as possible. You can reduce the number of germs on your skin by washing  with CHG (chlorhexidine  gluconate) soap before surgery. CHG is an antiseptic soap that kills germs and continues to kill germs even after washing.   DO NOT use if you have an allergy to chlorhexidine /CHG or antibacterial soaps. If your skin becomes reddened or irritated, stop using the CHG and notify one of our RNs at [phone].              TAKE A SHOWER THE NIGHT BEFORE SURGERY AND THE DAY OF SURGERY    Please keep in mind the following:  DO NOT shave, including legs and underarms, 48 hours prior to  surgery.   You may shave your face before/day of surgery.  Place clean sheets on your bed the night before surgery Use a clean washcloth (not used since being washed) for each shower. DO NOT sleep with pet's night before surgery.  CHG Shower Instructions:  Wash your face and private area with normal soap. If you choose to wash your hair, wash first with your normal shampoo.  After you use shampoo/soap, rinse your hair and body thoroughly to remove shampoo/soap residue.  Turn the water OFF and apply half the bottle of CHG soap to a CLEAN washcloth.  Apply CHG soap ONLY FROM YOUR NECK DOWN TO YOUR TOES (washing for 3-5 minutes)  DO NOT use CHG soap on face, private areas, open wounds, or sores.  Pay special attention to the area where your surgery is being performed.  If you are having back surgery, having someone wash your back for you may be helpful. Wait 2 minutes after CHG soap is applied, then you may rinse off the CHG soap.  Pat dry with a clean towel  Put on clean pajamas    Additional instructions for the day of surgery: DO NOT APPLY any lotions, deodorants, cologne, or perfumes.   Do not wear jewelry or makeup Do not wear nail polish, gel polish, artificial nails, or any other type of covering on natural nails (fingers and toes) Do not bring valuables to the hospital. [facility] is not responsible for valuables/personal belongings. Put on clean/comfortable clothes.  Please brush your teeth.  Ask your nurse before applying any prescription medications to the skin.

## 2024-04-11 ENCOUNTER — Encounter (HOSPITAL_COMMUNITY): Payer: Self-pay

## 2024-04-11 ENCOUNTER — Encounter (HOSPITAL_COMMUNITY)
Admission: RE | Admit: 2024-04-11 | Discharge: 2024-04-11 | Disposition: A | Discharge status: Home / Self Care | Source: Ambulatory Visit | Attending: Internal Medicine | Admitting: Internal Medicine

## 2024-04-11 ENCOUNTER — Other Ambulatory Visit: Payer: Self-pay

## 2024-04-11 VITALS — BP 161/69 | HR 82 | Temp 97.5°F | Resp 17 | Ht 60.0 in | Wt 110.5 lb

## 2024-04-11 DIAGNOSIS — I2510 Atherosclerotic heart disease of native coronary artery without angina pectoris: Secondary | ICD-10-CM | POA: Insufficient documentation

## 2024-04-11 DIAGNOSIS — Z01818 Encounter for other preprocedural examination: Secondary | ICD-10-CM | POA: Diagnosis not present

## 2024-04-11 LAB — CBC
HCT: 41.3 % (ref 36.0–46.0)
Hemoglobin: 13 g/dL (ref 12.0–15.0)
MCH: 32.3 pg (ref 26.0–34.0)
MCHC: 31.5 g/dL (ref 30.0–36.0)
MCV: 102.5 fL — ABNORMAL HIGH (ref 80.0–100.0)
Platelets: 417 K/uL — ABNORMAL HIGH (ref 150–400)
RBC: 4.03 MIL/uL (ref 3.87–5.11)
RDW: 15 % (ref 11.5–15.5)
WBC: 19.2 K/uL — ABNORMAL HIGH (ref 4.0–10.5)
nRBC: 0 % (ref 0.0–0.2)

## 2024-04-11 LAB — BASIC METABOLIC PANEL WITH GFR
Anion gap: 10 (ref 5–15)
BUN: 15 mg/dL (ref 8–23)
CO2: 27 mmol/L (ref 22–32)
Calcium: 9.2 mg/dL (ref 8.9–10.3)
Chloride: 101 mmol/L (ref 98–111)
Creatinine, Ser: 0.65 mg/dL (ref 0.44–1.00)
GFR, Estimated: >60 mL/min (ref >60)
Glucose, Bld: 97 mg/dL (ref 70–99)
Potassium: 4.1 mmol/L (ref 3.5–5.1)
Sodium: 138 mmol/L (ref 135–145)

## 2024-04-11 NOTE — Progress Notes (Addendum)
 PCP - Dr. Glendia Freeman Cardiologist - Denies Pulmonologist - Dr. Donnice Beals - last office visit 04/08/24  PPM/ICD - Denies Device Orders - n/a Rep Notified - n/a  Chest x-ray - 03/27/2024 EKG - 04/11/2024 Stress Test - Denies ECHO - 06/25/2020 Cardiac Cath - Denies  Sleep Study - Denies CPAP - n/a  No DM  Last dose of GLP1 agonist- n/a  GLP1 instructions: n/a  Blood Thinner Instructions: n/a Aspirin  Instructions: Pt instructed to contact office for ASA instructions  ERAS Protcol - Clear liquids until 0530 morning of surgery PRE-SURGERY Ensure or G2- n/a  COVID TEST- n/a   Anesthesia review: Yes. Breast seed placement and EKG review. Hx of HTN and MAC lung disease. Pt recently diagnosed with an exacerbation of bronchiectasis on 7/2. She completed Augmentin  and will complete steroids on Sunday. She has symptom improvement with about three coughing spells per day (lasting about 5 minutes) and once she is able to clear secretions she is fine. She was prescribed levaquin  to start today if she did not have symptom improvement, but states since she feels better she will not start. Pt encouraged to reach out to pulmonary to make sure she does not need an additional course of antibiotics. All discussed with Lynwood Hope, PA-C.  Patient denies shortness of breath, fever, cough and chest pain at PAT appointment   All instructions explained to the patient, with a verbal understanding of the material. Patient agrees to go over the instructions while at home for a better understanding. Patient also instructed to self quarantine after being tested for COVID-19. The opportunity to ask questions was provided.

## 2024-04-12 NOTE — Progress Notes (Signed)
 Anesthesia Chart Review:  89 year old female follows with pulmonology for history of bronchiectasis and MAI colonization which has never required treatment.  She was seen by Dr. Annella on 04/08/2024 to follow-up on recent exacerbation.  Per note, she noted significant overall improvement with Trelegy which indicates some symptoms likely driven by asthma/COPD.  She was still reporting cough despite recent Augmentin .  She did note some improvement since starting prednisone  the prior day.  Dr. Annella prescribed Levaquin  to use if symptoms did not improve.  Upcoming surgery was also discussed.  Per note, Perioperative evaluation: Pulmonary medicine does not provide preoperative clearance or other preoperative evaluation. Based on the ARISCAT model patient is intermediate to 13.3% risk of postoperative pulmonary complications if duration of surgery is less than 2 hours. Duration of surgery is greater than 2 hours patient is high or 42.1% risk of postoperative pulmonary complication. Her risk is a bit elevated given prescription for antibiotic for bronchiectasis exacerbation.  Prior echocardiogram 05/2020 reported mild aortic stenosis with mean gradient 9 mmHg.  History of right breast cancer s/p lumpectomy 03/2016 with recent recurrence.  At preadmission testing appointment the patient reported her symptoms continue to improve with prednisone  and Trelegy and she does not feel the need to start levofloxacin .  She says she still has been coughing spells during the day which improve once she is able to clear mucus.  She was instructed to let us  know if she had any worsening of her symptoms.  If she continues trajectory of improvement, anticipate she can proceed as planned.  Preop labs reviewed, WBC elevated at 19.2 likely related to chronic prednisone  use, otherwise unremarkable.  EKG 04/11/2024: Sinus rhythm with 1st degree A-V block with Premature atrial complexes.  Rate 75. Possible Anterior infarct ,  age undetermined  TTE 06/25/2020: 1. Left ventricular ejection fraction, by estimation, is 60 to 65%. The  left ventricle has normal function. The left ventricle has no regional  wall motion abnormalities. Left ventricular diastolic parameters are  consistent with Grade I diastolic  dysfunction (impaired relaxation).   2. Right ventricular systolic function is normal. The right ventricular  size is normal. There is normal pulmonary artery systolic pressure. The  estimated right ventricular systolic pressure is 29.8 mmHg.   3. The mitral valve is normal in structure. Trivial mitral valve  regurgitation. No evidence of mitral stenosis. Moderate mitral annular  calcification.   4. The aortic valve is tricuspid. Aortic valve regurgitation is not  visualized. Mild aortic valve stenosis. Aortic valve area, by VTI measures  1.50 cm. Aortic valve mean gradient measures 9.0 mmHg.   5. The inferior vena cava is normal in size with greater than 50%  respiratory variability, suggesting right atrial pressure of 3 mmHg.    Lynwood Geofm RIGGERS [facility]/Anesthesiology Phone [phone] 04/12/2024 3:02 PM

## 2024-04-12 NOTE — Anesthesia Preprocedure Evaluation (Addendum)
 Anesthesia Evaluation  Patient identified by MRN, date of birth, ID band Patient awake    Reviewed: Allergy & Precautions, NPO status , Patient's Chart, lab work & pertinent test results  Airway Mallampati: II  TM Distance: >3 FB Neck ROM: Full    Dental no notable dental hx. (+) Teeth Intact, Dental Advisory Given   Pulmonary shortness of breath, asthma  Hx of Bronchiectasis   Pulmonary exam normal breath sounds clear to auscultation       Cardiovascular hypertension, Pt. on medications (-) angina (-) Past MI Normal cardiovascular exam Rhythm:Regular Rate:Normal   EKG 04/11/2024: Sinus rhythm with 1st degree A-V block with Premature atrial complexes.  Rate 75.  TTE 06/25/2020: 1. Left ventricular ejection fraction, by estimation, is 60 to 65%. The  left ventricle has normal function. The left ventricle has no regional  wall motion abnormalities. Left ventricular diastolic parameters are  consistent with Grade I diastolic  dysfunction (impaired relaxation).  2. Right ventricular systolic function is normal. The right ventricular  size is normal. There is normal pulmonary artery systolic pressure. The  estimated right ventricular systolic pressure is 29.8 mmHg.  3. The mitral valve is normal in structure. Trivial mitral valve  regurgitation. No evidence of mitral stenosis. Moderate mitral annular  calcification.  4. The aortic valve is tricuspid. Aortic valve regurgitation is not  visualized. Mild aortic valve stenosis. Aortic valve area, by VTI measures  1.50 cm. Aortic valve mean gradient measures 9.0 mmHg.  5. The inferior vena cava is normal in size with greater than 50%  respiratory variability, suggesting right atrial pressure of 3 mmHg.     Neuro/Psych negative neurological ROS  negative psych ROS   GI/Hepatic ,GERD  Medicated and Controlled,,  Endo/Other    Renal/GU Lab Results      Component                 Value               Date                         K                        4.1                 04/11/2024                   CREATININE               0.65                04/11/2024                GFRNONAA                 >60                 04/11/2024                      GLUCOSE                  97                  04/11/2024                Musculoskeletal  (+) Arthritis ,    Abdominal   Peds  Hematology Lab Results  Component                Value               Date                      WBC                      19.2 (H)            04/11/2024                HGB                      13.0                04/11/2024                HCT                      41.3                04/11/2024                MCV                      102.5 (H)           04/11/2024                PLT                      417 (H)             04/11/2024              Anesthesia Other Findings All: Azithromycin, Tape  Reproductive/Obstetrics                              Anesthesia Physical Anesthesia Plan  ASA: 3  Anesthesia Plan: General   Post-op Pain Management: Precedex  and Ofirmev  IV (intra-op)*   Induction: Intravenous  PONV Risk Score and Plan: 3 and Treatment may vary due to age or medical condition, Propofol  infusion, TIVA and Ondansetron   Airway Management Planned: LMA  Additional Equipment: None  Intra-op Plan:   Post-operative Plan:   Informed Consent: I have reviewed the patients History and Physical, chart, labs and discussed the procedure including the risks, benefits and alternatives for the proposed anesthesia with the patient or authorized representative who has indicated his/her understanding and acceptance.     Dental advisory given  Plan Discussed with: CRNA and Surgeon  Anesthesia Plan Comments: (LMA TIVA +/- R Pec block)         Anesthesia Quick Evaluation

## 2024-04-19 ENCOUNTER — Other Ambulatory Visit: Payer: Self-pay | Admitting: General Surgery

## 2024-04-19 ENCOUNTER — Ambulatory Visit
Admission: RE | Admit: 2024-04-19 | Discharge: 2024-04-19 | Disposition: A | Discharge status: Home / Self Care | Source: Ambulatory Visit | Attending: General Surgery | Admitting: General Surgery

## 2024-04-19 DIAGNOSIS — C50411 Malignant neoplasm of upper-outer quadrant of right female breast: Secondary | ICD-10-CM

## 2024-04-19 HISTORY — PX: BREAST BIOPSY: SHX20

## 2024-04-22 ENCOUNTER — Ambulatory Visit (HOSPITAL_COMMUNITY): Payer: Self-pay | Admitting: Physician Assistant

## 2024-04-22 ENCOUNTER — Encounter (HOSPITAL_COMMUNITY)
Admission: RE | Disposition: A | Discharge status: Home / Self Care | Payer: Self-pay | Source: Home / Self Care | Attending: General Surgery

## 2024-04-22 ENCOUNTER — Ambulatory Visit
Admission: RE | Admit: 2024-04-22 | Discharge: 2024-04-22 | Disposition: A | Discharge status: Home / Self Care | Source: Ambulatory Visit | Attending: General Surgery | Admitting: General Surgery

## 2024-04-22 ENCOUNTER — Other Ambulatory Visit: Payer: Self-pay

## 2024-04-22 ENCOUNTER — Encounter (HOSPITAL_COMMUNITY): Payer: Self-pay | Admitting: General Surgery

## 2024-04-22 ENCOUNTER — Ambulatory Visit (HOSPITAL_COMMUNITY)
Admission: RE | Admit: 2024-04-22 | Discharge: 2024-04-22 | Disposition: A | Discharge status: Home / Self Care | Attending: General Surgery | Admitting: General Surgery

## 2024-04-22 DIAGNOSIS — C50411 Malignant neoplasm of upper-outer quadrant of right female breast: Secondary | ICD-10-CM | POA: Insufficient documentation

## 2024-04-22 DIAGNOSIS — J45909 Unspecified asthma, uncomplicated: Secondary | ICD-10-CM

## 2024-04-22 DIAGNOSIS — Z1732 Human epidermal growth factor receptor 2 negative status: Secondary | ICD-10-CM | POA: Diagnosis not present

## 2024-04-22 DIAGNOSIS — C50911 Malignant neoplasm of unspecified site of right female breast: Secondary | ICD-10-CM | POA: Diagnosis not present

## 2024-04-22 DIAGNOSIS — I10 Essential (primary) hypertension: Secondary | ICD-10-CM | POA: Diagnosis not present

## 2024-04-22 DIAGNOSIS — Z79899 Other long term (current) drug therapy: Secondary | ICD-10-CM | POA: Diagnosis not present

## 2024-04-22 DIAGNOSIS — K219 Gastro-esophageal reflux disease without esophagitis: Secondary | ICD-10-CM | POA: Diagnosis not present

## 2024-04-22 DIAGNOSIS — Z171 Estrogen receptor negative status [ER-]: Secondary | ICD-10-CM | POA: Diagnosis not present

## 2024-04-22 DIAGNOSIS — I350 Nonrheumatic aortic (valve) stenosis: Secondary | ICD-10-CM | POA: Insufficient documentation

## 2024-04-22 DIAGNOSIS — Z170 Estrogen receptor positive status [ER+]: Secondary | ICD-10-CM

## 2024-04-22 DIAGNOSIS — M1990 Unspecified osteoarthritis, unspecified site: Secondary | ICD-10-CM | POA: Diagnosis not present

## 2024-04-22 DIAGNOSIS — Z803 Family history of malignant neoplasm of breast: Secondary | ICD-10-CM | POA: Diagnosis not present

## 2024-04-22 DIAGNOSIS — Z1501 Genetic susceptibility to malignant neoplasm of breast: Secondary | ICD-10-CM | POA: Insufficient documentation

## 2024-04-22 DIAGNOSIS — Z1721 Progesterone receptor positive status: Secondary | ICD-10-CM | POA: Insufficient documentation

## 2024-04-22 DIAGNOSIS — G8918 Other acute postprocedural pain: Secondary | ICD-10-CM | POA: Diagnosis not present

## 2024-04-22 HISTORY — PX: BREAST LUMPECTOMY: SHX2

## 2024-04-22 HISTORY — PX: BREAST LUMPECTOMY WITH RADIOACTIVE SEED LOCALIZATION: SHX6424

## 2024-04-22 SURGERY — BREAST LUMPECTOMY WITH RADIOACTIVE SEED LOCALIZATION
Anesthesia: General | Site: Breast | Laterality: Right

## 2024-04-22 MED ORDER — CHLORHEXIDINE GLUCONATE 0.12 % MT SOLN
15.0000 mL | Freq: Once | OROMUCOSAL | Status: AC
  Administered 2024-04-22: 15 mL via OROMUCOSAL
  Filled 2024-04-22: qty 15

## 2024-04-22 MED ORDER — ROPIVACAINE HCL 5 MG/ML IJ SOLN
INTRAMUSCULAR | Status: DC | PRN
  Administered 2024-04-22: 25 mL via PERINEURAL

## 2024-04-22 MED ORDER — BUPIVACAINE-EPINEPHRINE (PF) 0.25% -1:200000 IJ SOLN
INTRAMUSCULAR | Status: AC
  Filled 2024-04-22: qty 30

## 2024-04-22 MED ORDER — CHLORHEXIDINE GLUCONATE CLOTH 2 % EX PADS: 6.0000 | MEDICATED_PAD | Freq: Once | CUTANEOUS | Status: DC

## 2024-04-22 MED ORDER — DEXAMETHASONE SODIUM PHOSPHATE 4 MG/ML IJ SOLN
INTRAMUSCULAR | Status: DC | PRN
  Administered 2024-04-22: 5 mg via INTRAVENOUS

## 2024-04-22 MED ORDER — ONDANSETRON HCL 4 MG/2ML IJ SOLN: 4.0000 mg | Freq: Once | INTRAMUSCULAR | Status: DC | PRN

## 2024-04-22 MED ORDER — ACETAMINOPHEN 10 MG/ML IV SOLN: 1000.0000 mg | Freq: Once | INTRAVENOUS | Status: DC | PRN

## 2024-04-22 MED ORDER — TRAMADOL HCL 50 MG PO TABS: 50.0000 mg | ORAL_TABLET | Freq: Four times a day (QID) | ORAL | 0 refills | Status: DC | PRN

## 2024-04-22 MED ORDER — ACETAMINOPHEN 10 MG/ML IV SOLN
INTRAVENOUS | Status: AC
  Filled 2024-04-22: qty 100

## 2024-04-22 MED ORDER — ACETAMINOPHEN 500 MG PO TABS
1000.0000 mg | ORAL_TABLET | ORAL | Status: AC
  Administered 2024-04-22: 1000 mg via ORAL
  Filled 2024-04-22: qty 2

## 2024-04-22 MED ORDER — PHENYLEPHRINE 80 MCG/ML (10ML) SYRINGE FOR IV PUSH (FOR BLOOD PRESSURE SUPPORT)
PREFILLED_SYRINGE | INTRAVENOUS | Status: AC
  Filled 2024-04-22: qty 10

## 2024-04-22 MED ORDER — CEFAZOLIN SODIUM-DEXTROSE 2-4 GM/100ML-% IV SOLN
2.0000 g | INTRAVENOUS | Status: AC
  Administered 2024-04-22: 2 g via INTRAVENOUS

## 2024-04-22 MED ORDER — LIDOCAINE HCL 1 % IJ SOLN
INTRAMUSCULAR | Status: AC
  Filled 2024-04-22: qty 20

## 2024-04-22 MED ORDER — 0.9 % SODIUM CHLORIDE (POUR BTL) OPTIME
TOPICAL | Status: DC | PRN
  Administered 2024-04-22: 1000 mL

## 2024-04-22 MED ORDER — FENTANYL CITRATE (PF) 250 MCG/5ML IJ SOLN
INTRAMUSCULAR | Status: AC
  Filled 2024-04-22: qty 5

## 2024-04-22 MED ORDER — LIDOCAINE 2% (20 MG/ML) 5 ML SYRINGE
INTRAMUSCULAR | Status: AC
  Filled 2024-04-22: qty 5

## 2024-04-22 MED ORDER — LIDOCAINE HCL 1 % IJ SOLN
INTRAMUSCULAR | Status: DC | PRN
  Administered 2024-04-22: 30 mL via INTRAMUSCULAR

## 2024-04-22 MED ORDER — CEFAZOLIN SODIUM-DEXTROSE 2-4 GM/100ML-% IV SOLN
INTRAVENOUS | Status: AC
  Filled 2024-04-22: qty 100

## 2024-04-22 MED ORDER — FENTANYL CITRATE (PF) 100 MCG/2ML IJ SOLN
INTRAMUSCULAR | Status: AC
Start: 2024-04-22 — End: 2024-04-22
  Filled 2024-04-22: qty 2

## 2024-04-22 MED ORDER — DEXMEDETOMIDINE HCL IN NACL 80 MCG/20ML IV SOLN
INTRAVENOUS | Status: DC | PRN
  Administered 2024-04-22 (×2): 4 ug via INTRAVENOUS

## 2024-04-22 MED ORDER — PROPOFOL 10 MG/ML IV BOLUS
INTRAVENOUS | Status: DC | PRN
  Administered 2024-04-22: 50 mg via INTRAVENOUS
  Administered 2024-04-22: 20 mg via INTRAVENOUS
  Administered 2024-04-22: 60 mg via INTRAVENOUS
  Administered 2024-04-22: 20 mg via INTRAVENOUS

## 2024-04-22 MED ORDER — LACTATED RINGERS IV SOLN: INTRAVENOUS | Status: DC

## 2024-04-22 MED ORDER — PROPOFOL 10 MG/ML IV BOLUS
INTRAVENOUS | Status: AC
Start: 2024-04-22 — End: 2024-04-22
  Filled 2024-04-22: qty 20

## 2024-04-22 MED ORDER — ORAL CARE MOUTH RINSE: 15.0000 mL | Freq: Once | OROMUCOSAL | Status: AC

## 2024-04-22 MED ORDER — PROPOFOL 500 MG/50ML IV EMUL
INTRAVENOUS | Status: DC | PRN
  Administered 2024-04-22: 75 ug/kg/min via INTRAVENOUS

## 2024-04-22 MED ORDER — FENTANYL CITRATE (PF) 100 MCG/2ML IJ SOLN
25.0000 ug | INTRAMUSCULAR | Status: DC | PRN
  Administered 2024-04-22: 25 ug via INTRAVENOUS

## 2024-04-22 MED ORDER — LIDOCAINE 2% (20 MG/ML) 5 ML SYRINGE
INTRAMUSCULAR | Status: DC | PRN
  Administered 2024-04-22: 60 mg via INTRAVENOUS

## 2024-04-22 MED ORDER — FENTANYL CITRATE (PF) 250 MCG/5ML IJ SOLN
INTRAMUSCULAR | Status: DC | PRN
  Administered 2024-04-22 (×2): 25 ug via INTRAVENOUS

## 2024-04-22 MED ORDER — ONDANSETRON HCL 4 MG/2ML IJ SOLN
INTRAMUSCULAR | Status: DC | PRN
  Administered 2024-04-22: 4 mg via INTRAVENOUS

## 2024-04-22 SURGICAL SUPPLY — 34 items
BAG COUNTER SPONGE SURGICOUNT (BAG) ×1 IMPLANT
BINDER BREAST LRG (GAUZE/BANDAGES/DRESSINGS) IMPLANT
BINDER BREAST XLRG (GAUZE/BANDAGES/DRESSINGS) IMPLANT
CANISTER SUCTION 3000ML PPV (SUCTIONS) IMPLANT
CHLORAPREP W/TINT 26 (MISCELLANEOUS) ×1 IMPLANT
CLIP TI LARGE 6 (CLIP) ×1 IMPLANT
COVER PROBE W GEL 5X96 (DRAPES) ×1 IMPLANT
COVER SURGICAL LIGHT HANDLE (MISCELLANEOUS) ×1 IMPLANT
DERMABOND ADVANCED .7 DNX12 (GAUZE/BANDAGES/DRESSINGS) ×1 IMPLANT
DEVICE DUBIN SPECIMEN MAMMOGRA (MISCELLANEOUS) ×1 IMPLANT
DRAPE CHEST BREAST 15X10 FENES (DRAPES) ×1 IMPLANT
ELECT COATED BLADE 2.86 ST (ELECTRODE) ×1 IMPLANT
ELECTRODE REM PT RTRN 9FT ADLT (ELECTROSURGICAL) ×1 IMPLANT
GAUZE PAD ABD 8X10 STRL (GAUZE/BANDAGES/DRESSINGS) ×1 IMPLANT
GAUZE SPONGE 4X4 12PLY STRL LF (GAUZE/BANDAGES/DRESSINGS) ×1 IMPLANT
GLOVE BIO SURGEON STRL SZ 6 (GLOVE) ×1 IMPLANT
GLOVE INDICATOR 6.5 STRL GRN (GLOVE) ×1 IMPLANT
GOWN STRL REUS W/ TWL LRG LVL3 (GOWN DISPOSABLE) ×1 IMPLANT
GOWN STRL REUS W/ TWL XL LVL3 (GOWN DISPOSABLE) ×1 IMPLANT
KIT BASIN OR (CUSTOM PROCEDURE TRAY) ×1 IMPLANT
KIT MARKER MARGIN INK (KITS) ×1 IMPLANT
LIGHT WAVEGUIDE WIDE FLAT (MISCELLANEOUS) IMPLANT
NDL HYPO 25GX1X1/2 BEV (NEEDLE) ×1 IMPLANT
NEEDLE HYPO 25GX1X1/2 BEV (NEEDLE) ×1 IMPLANT
NS IRRIG 1000ML POUR BTL (IV SOLUTION) IMPLANT
PACK GENERAL/GYN (CUSTOM PROCEDURE TRAY) ×1 IMPLANT
STRIP CLOSURE SKIN 1/2X4 (GAUZE/BANDAGES/DRESSINGS) ×1 IMPLANT
SUT MNCRL AB 4-0 PS2 18 (SUTURE) ×1 IMPLANT
SUT SILK 2 0 SH (SUTURE) IMPLANT
SUT VIC AB 2-0 SH 27XBRD (SUTURE) IMPLANT
SUT VIC AB 3-0 SH 27X BRD (SUTURE) ×1 IMPLANT
SYR CONTROL 10ML LL (SYRINGE) ×1 IMPLANT
TOWEL GREEN STERILE (TOWEL DISPOSABLE) ×1 IMPLANT
TOWEL GREEN STERILE FF (TOWEL DISPOSABLE) ×1 IMPLANT

## 2024-04-22 NOTE — H&P (Deleted)
 REFERRING PHYSICIAN: Arceo  PROVIDER: Dinari Stgermaine LEDFORD Trentyn Boisclair, MD  Care Team: Patient Care Team: Larnell Glendia CROME, MD as PCP - General (Nephrology) Hunsucker, Donnice Motto, MD as Referring Physician (Pulmonary Disease) Madison Selinda BIRCH, MD as Referring Physician (Hematology and Oncology)   MRN: [id number] DOB: 1926/04/24 DATE OF ENCOUNTER: 04/01/2024  Subjective   Chief Complaint: No chief complaint on file.   History of Present Illness: Carla [name] is a 89 y.o. female who is seen today as an office consultation at the request of Dr. Moira for evaluation of No chief complaint on file.  Patient presents with a new diagnosis of right breast cancer June 2025. Of note, she also has a history of right breast cancer in 2017 and in 2024.  The most recent breast cancer presents with an area of palpable concern at 11:00. Diagnostic imaging was performed. On mammogram there were seen to be 2 adjacent oval density masses with indistinct margins with total span of 3.2 cm. On ultrasound, there was a hypoechoic mass that was 10 mm. At 11:00 4 cm from the nipple there were 2 additional masses that were 4 mm and 5 mm for a total of 2.3 cm. There was a lymph node with focal cortical thickening on the right measuring up to 3 mm. She underwent biopsy of the lymph node in the 2 masses at furthest distance from each other. These were both grade 2 invasive mammary carcinomas of the ductal type. They were ER and PR positive, HER2 was negative, Ki-67 was 10%. The clips were 2.7 cm apart.  Looking back at the other breast cancers, in 2017 there was a grade 2 invasive ductal carcinoma that was T1b N0 that was lumpectomy with no radiation. This was ER/PR positive, HER2 negative, Ki-67 20%. Tamoxifen  was attempted but she had severe hot flashes and discontinued it.. Cancer in 2017 was taken care of here. She did well until 2024. She then had a screening detected mass which was a T1c NX breast cancer that was ER and  PR positive HER2 negative, 2+ , Ki-67 25% FISH negative. Margins were negative in 2024. Dr. Moira took care of this cancer. She did not get radiation for either of these.  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  Also, patient has a history of mild Mycobacterium Avium colonization and bronchiectasis.  Family cancer history -sister with breast cancer, brother with prostate cancer, daughter with breast cancer, niece with breast cancer Menopause -hysterectomy age 28 Parity -P3  Hobbies include painting and cooking  Diagnostic mammogram/ultrasound: [facility] 03/11/24 FINDINGS:  Mammographically, in the area of palpable concern, there is an oval equal density mass with indistinct margins in the upper outer quadrant of the right breast, middle depth. This is located superficial to the post-surgical changes.  Additionally, in the upper outer quadrant of the right breast, posterior depth, there two adjacent oval equal density masses with indistinct margins. The total span of the findings measures approximately 3.2 cm.  On physical examination, a firm nonmobile mass is palpated at the lumpectomy site.  On targeted ultrasound, at 11:00 3 cm from the nipple, there is an oval hypoechoic mass with indistinct margins corresponding with the area of palpable concern. This measures 10 x 6 x 6 mm.  At 11:00 4 cm from the nipple, there are two additional adjacent round hypoechoic masses with circumscribed margins. The smaller mass (annotated as mass #2) measures 4 x 3 x 3 mm. The larger mass (annotated as mass #3) measures 5 x  4 x 4mm. All three masses are demonstrated on an oblique view, spanning a total of 2.3 cm.  In the right axilla, there is a lymph node with focal cortical thickening measuring up to 3mm.  CONCLUSION:  Highly suspicious right breast mass corresponding to the area of palpable concern at 11:00 3 cm from the nipple. Additional suspicious masses are seen more posteriorly, total span of suspected disease  measures 2.3 cm sonographically.  Abnormal right axillary lymph node.  Breast composition: There are scattered areas of fibroglandular density.  BI-RADS Category: 5 - Highly suggestive of malignancy. Appropriate action should be taken.  RECOMMENDATION: Biopsy - recommend ultrasound guided biopsy of the largest right breast mass at 11:00 3 cm from the nipple and ultrasound guided biopsy of the abnormal right axillary lymph node. If the pathology for the 11:00 mass is negative, additional biopsy of one of the smaller round masses should be considered.   Pathology core needle biopsy: 03/15/2024 1. Breast, right, needle core biopsy, 11:00 3 cmfn : - INVASIVE MAMMARY CARCINOMA, NO SPECIAL TYPE (DUCTAL). - TUBULE FORMATION: SCORE 3 - NUCLEAR PLEOMORPHISM: SCORE 2 - MITOTIC COUNT: SCORE 1 - TOTAL SCORE: 6 - OVERALL GRADE: 2 - LYMPHOVASCULAR INVASION: NOT IDENTIFIED - CANCER LENGTH: 10 MM - CALCIFICATIONS: NOT IDENTIFIED - DUCTAL CARCINOMA IN SITU: NOT IDENTIFIED - SEE NOTE.  2. Breast, right, needle core biopsy, 11:00 4 cmfn : - INVASIVE MAMMARY CARCINOMA, NO SPECIAL TYPE (DUCTAL). - TUBULE FORMATION: SCORE 3 - NUCLEAR PLEOMORPHISM: SCORE 2 - MITOTIC COUNT: SCORE 1 - TOTAL SCORE: 6 - OVERALL GRADE: 2 - LYMPHOVASCULAR INVASION: NOT IDENTIFIED - CANCER LENGTH: 5 MM - CALCIFICATIONS: NOT IDENTIFIED - DUCTAL CARCINOMA IN SITU: NOT IDENTIFIED - SEE NOTE.  3. Lymph node, needle/core biopsy, Rt axilla : - LYMPH NODE TISSUE, NEGATIVE FOR MALIGNANCY.   Receptors: The tumor cells are NEGATIVE for Her2 (1+).  Estrogen Receptor: 100%, POSITIVE, STRONG STAINING INTENSITY  Progesterone Receptor: 70%, POSITIVE, WEAK- MODERATE STAINING INTENSITY  Proliferation Marker Ki67: 10%   Review of Systems: A complete review of systems was obtained from the patient. I have reviewed this information and discussed as appropriate with the patient. See HPI as well for other ROS. ROS -positive for hearing loss,  nasal congestion, visual disturbance, cough, sputum production, wheezing, heartburn, diarrhea, easy bruising, and anxiety,  Medical History: Past Medical History:  Diagnosis Date  Anemia  Anxiety  Arthritis  GERD (gastroesophageal reflux disease)  History of cancer   Patient Active Problem List  Diagnosis  Malignant neoplasm of upper-outer quadrant of right breast in female, estrogen receptor positive (CMS/HHS-HCC)  Bronchiolectasis (CMS/HHS-HCC)  CHEK2 positive   Past Surgical History:  Procedure Laterality Date  JOINT REPLACEMENT    Allergies  Allergen Reactions  Azithromycin Other (See Comments)  MAC patient, please avoid azithromycin and other medicines in class and choose other amti-microbial as indicated   Current Outpatient Medications on File Prior to Visit  Medication Sig Dispense Refill  amoxicillin -clavulanate (AUGMENTIN ) 875-125 mg tablet Take 1 tablet by mouth 2 (two) times daily  aspirin  81 MG EC tablet Take 81 mg by mouth once daily  biotin  5,000 mcg Chew Take 1 tablet by mouth once daily  cetirizine (ZYRTEC) 10 MG tablet Take 10 mg by mouth once daily  cholecalciferol (VITAMIN D3) 1000 unit tablet Take 1,000 Units by mouth once daily  cyanocobalamin, vitamin B-12, 5,000 mcg Chew Take 1 tablet by mouth once daily  fluticasone -umeclidinium-vilanterol (TRELEGY ELLIPTA ) 200-62.5-25 mcg inhaler Inhale 1 Puff into  the lungs once daily  Lactobacillus acidophilus (ACIDOPHILUS ORAL) Take 1 tablet by mouth once daily  losartan  (COZAAR ) 50 MG tablet Take 50 mg by mouth once daily  multivitamin with minerals, EYE, (PRESERVISION AREDS 2) soft gel capsule Take 2 capsules by mouth once daily  omeprazole  (PRILOSEC) 20 MG DR capsule Take 20 mg by mouth 2 (two) times daily before meals  potassium citrate 99 mg Cap Take 1 tablet by mouth once daily  vitamin E 400 unit capsule Take 400 Units by mouth once daily   No current facility-administered medications on file prior to  visit.   Family History  Problem Relation Age of Onset  High blood pressure (Hypertension) Mother  High blood pressure (Hypertension) Father  Breast cancer Sister  Breast cancer Sister  Deep vein thrombosis (DVT or abnormal blood clot formation) Brother  Stroke Brother    Social History   Tobacco Use  Smoking Status Never  Passive exposure: Never  Smokeless Tobacco Never    Social History   Socioeconomic History  Marital status: Divorced  Tobacco Use  Smoking status: Never  Passive exposure: Never  Smokeless tobacco: Never  Vaping Use  Vaping status: Never Used  Substance and Sexual Activity  Alcohol  use: Never  Drug use: Never   Social Drivers of Health   Food Insecurity: Low Risk (08/29/2023)  Received from [facility]  Hunger Vital Sign  Within the past 12 months, you worried that your food would run out before you got money to buy more: Never true  Within the past 12 months, the food you bought just didn't last and you didn't have money to get more. : Never true  Transportation Needs: No Transportation Needs (08/29/2023)  Received from [employer]  In the past 12 months, has lack of reliable transportation kept you from medical appointments, meetings, work or from getting things needed for daily living? : No  Housing Stability: Unknown (04/01/2024)  Housing Stability Vital Sign  Homeless in the Last Year: No   Objective:   Vitals:  04/01/24 1402  BP: 135/74  Pulse: 92  Temp: 36.7 C (98 F)  TempSrc: Oral  SpO2: 93%  Weight: 50.5 kg (111 lb 6.4 oz)  Height: 152.4 cm (5')  PainSc: 0-No pain   Body mass index is 21.76 kg/m.  Gen: No acute distress. Well nourished and well groomed.  Neurological: Alert and oriented to person, place, and time. Coordination normal.  Head: Normocephalic and atraumatic.  Eyes: Conjunctivae are normal. Pupils are equal, round, and reactive to light. No scleral icterus.  Neck: Normal range of motion.  Neck supple. No tracheal deviation or thyromegaly present.  Cardiovascular: Normal rate, regular rhythm, normal heart sounds and intact distal pulses. Exam reveals no gallop and no friction rub. No murmur heard. Breast: Right breast smaller than left. Transverse incision in the upper outer right breast. Firm region in this scar, but not discrete mass. No nipple retraction. No dramatic contour change. No lymphadenopathy. Respiratory: Effort normal. No respiratory distress. No chest wall tenderness. Breath sounds normal. No wheezes, rales or rhonchi.  GI: Soft. Bowel sounds are normal. The abdomen is soft and nontender. There is no rebound and no guarding.  Musculoskeletal: Normal range of motion. Extremities are nontender.  Lymphadenopathy: No cervical, preauricular, postauricular or axillary adenopathy is present Skin: Skin is warm and dry. No rash noted. No diaphoresis. No erythema. No pallor. No clubbing, cyanosis, or edema. Bruising left upper arm with abrasion. Psychiatric: Normal mood and affect.  Behavior is normal. Judgment and thought content normal.   Labs N/a  Assessment and Plan:   ICD-10-CM  1. Malignant neoplasm of upper-outer quadrant of right breast in female, estrogen receptor positive (CMS/HHS-HCC) C50.411  Z17.0   2. CHEK2 positive Z15.89   3. Bronchiolectasis (CMS/HHS-HCC) J47.9    Patient has new diagnosis of right breast cancer that is hormone positive and HER2 negative. It is unclear whether this is definitive recurrence or a new right breast cancer given that she is CHEK2 positive. Given its proximity to the previous cancer, it is likely a recurrence. I discussed that since this cancer is so close temporally to the last cancer in October/November 2024, that I would consider additional treatment. It sounds like she had a hard time with tamoxifen  in 2017, but is at significant risk of osteoporosis, so I am not sure that an AI would be a good choice. I advised her to  reconsider adjuvant treatment after surgery. I did discuss that the lymph node biopsy was concordant and that they were not findings that were of high concern for lymph node metastasis. I advised that the only way to have more certainty would be to do a sentinel lymph node biopsy, but the risk of complications and issues with mobility would be higher than the likelihood of finding lymph node metastasis in this 89 year old.  I would like to place seeds in the 2 farthest tumors to make sure that we do get these given the scar tissue that is present.  I discussed risks of surgery including bleeding, infection, change in breast contour, continued reduction in the size of the right breast, potential chronic breast pain, breast seroma, possible positive margins, possible need for additional procedures, possible recurrent breast cancer, possible heart or lung complications, possible blood clot.  I messaged the pulmonologist that she saw last week regarding her bronchiectasis. I cannot auscultate any changes in her breath sounds and her chest x-ray appeared unchanged. I also do not appreciate any congestion in her upper respiratory tract at this time, but want to make sure that pulmonary is not concerned about proceeding with surgery now.  The patient would like to try surgery with sedation if that is possible. I am referring this question to anesthesia.  The patient does have medical problems, but has a very high functional status. She gets along well and is quite independent.

## 2024-04-22 NOTE — Discharge Instructions (Signed)

## 2024-04-22 NOTE — Transfer of Care (Signed)
 Immediate Anesthesia Transfer of Care Note  Patient: Carla [name]  Procedure(s) Performed: BREAST LUMPECTOMY WITH RADIOACTIVE SEED LOCALIZATION (Right: Breast)  Patient Location: PACU  Anesthesia Type:GA combined with regional for post-op pain  Level of Consciousness: awake and alert   Airway & Oxygen Therapy: Patient Spontanous Breathing and Patient connected to face mask oxygen  Post-op Assessment: Report given to RN, Post -op Vital signs reviewed and stable, and Patient moving all extremities  Post vital signs: stable  Last Vitals:  Vitals Value Taken Time  BP 131/55 04/22/24 11:30  Temp 34.4 C 04/22/24 11:30  Pulse 61 04/22/24 11:34  Resp 13 04/22/24 11:34  SpO2 98 % 04/22/24 11:34  Vitals shown include unfiled device data.  Last Pain:  Vitals:   04/22/24 0704  PainSc: 0-No pain         Complications: There were no known notable events for this encounter.

## 2024-04-22 NOTE — Interval H&P Note (Signed)
 History and Physical Interval Note:  04/22/2024 8:46 AM  Carla [name]  has presented today for surgery, with the diagnosis of RIGHT BREAST CANCER.  The various methods of treatment have been discussed with the patient and family. After consideration of risks, benefits and other options for treatment, the patient has consented to  Procedure(s) with comments: BREAST LUMPECTOMY WITH RADIOACTIVE SEED LOCALIZATION (Right) - RIGHT BREAST BRACKETED LUMPECTOMY as a surgical intervention.  The patient's history has been reviewed, patient examined, no change in status, stable for surgery.  I have reviewed the patient's chart and labs.  Questions were answered to the patient's satisfaction.     [name]

## 2024-04-22 NOTE — Anesthesia Procedure Notes (Signed)
 Anesthesia Regional Block: Pectoralis block   Pre-Anesthetic Checklist: , timeout performed,  Correct Patient, Correct Site, Correct Laterality,  Correct Procedure, Correct Position, site marked,  Risks and benefits discussed,  Surgical consent,  Pre-op evaluation,  At surgeon's request and post-op pain management  Laterality: Right and Upper  Prep: chloraprep       Needles:  Injection technique: Single-shot  Needle Type: Echogenic Needle     Needle Length: 9cm  Needle Gauge: 21     Additional Needles:   Procedures:,,,, ultrasound used (permanent image in chart),,    Narrative:  Start time: 04/22/2024 8:55 AM End time: 04/22/2024 9:01 AM Injection made incrementally with aspirations every 5 mL.  Performed by: Personally  Anesthesiologist: Jefm Garnette LABOR, MD  Additional Notes: Block assessed. Patient tolerated procedure well.

## 2024-04-22 NOTE — Anesthesia Procedure Notes (Signed)
 Procedure Name: LMA Insertion Date/Time: 04/22/2024 9:25 AM  Performed by: Cornelio Parkerson J, CRNAPre-anesthesia Checklist: Patient identified, Emergency Drugs available, Suction available and Patient being monitored Patient Re-evaluated:Patient Re-evaluated prior to induction Oxygen Delivery Method: Circle System Utilized Preoxygenation: Pre-oxygenation with 100% oxygen Induction Type: IV induction Ventilation: Mask ventilation without difficulty LMA: LMA inserted LMA Size: 4.0 Number of attempts: 1 Airway Equipment and Method: Bite block Placement Confirmation: positive ETCO2 Tube secured with: Tape Dental Injury: Teeth and Oropharynx as per pre-operative assessment

## 2024-04-22 NOTE — Anesthesia Postprocedure Evaluation (Signed)
 Anesthesia Post Note  Patient: Carla [name]  Procedure(s) Performed: BREAST LUMPECTOMY WITH RADIOACTIVE SEED LOCALIZATION (Right: Breast)     Patient location during evaluation: PACU Anesthesia Type: General Level of consciousness: awake and alert Pain management: pain level controlled Vital Signs Assessment: post-procedure vital signs reviewed and stable Respiratory status: spontaneous breathing, nonlabored ventilation, respiratory function stable and patient connected to nasal cannula oxygen Cardiovascular status: blood pressure returned to baseline and stable Postop Assessment: no apparent nausea or vomiting Anesthetic complications: no   There were no known notable events for this encounter.  Last Vitals:  Vitals:   04/22/24 1300 04/22/24 1345  BP: (!) 162/53   Pulse: (!) 58 64  Resp: 10 11  Temp: 37.2 C 37.2 C  SpO2: (!) 85% 96%    Last Pain:  Vitals:   04/22/24 1345  PainSc: 0-No pain   Pain Goal: Patients Stated Pain Goal: 3 (04/22/24 1230)                 Garnette DELENA Gab

## 2024-04-22 NOTE — Op Note (Signed)
 right Breast Radioactive seed bracketed lumpectomy  Indications: This patient presents with history of right breast cancer, upper outer quadrant, invasive ductal carcinoma, grade 2, receptors +/+/-, CHEK2 mutation  Pre-operative Diagnosis: right breast cancer  Post-operative Diagnosis: same  Surgeon: ARON SHOULDERS   Assistant:  Puja Gosai, PA-C  Anesthesia: General endotracheal anesthesia  ASA Class: 3  Procedure Details  The patient was seen in the Holding Room. The risks, benefits, complications, treatment options, and expected outcomes were discussed with the patient. The possibilities of bleeding, infection, the need for additional procedures, failure to diagnose a condition, and creating a complication requiring other procedures or operations were discussed with the patient. The patient concurred with the proposed plan, giving informed consent.  The site of surgery properly noted/marked. The patient was taken to Operating Room # 2, identified, and the procedure verified as right breast seed localized lumpectomy.  The right breast and chest were prepped and draped in standard fashion. A transverse incision was made near the previously placed radioactive seeds in the prior incision extending laterally.  Dissection was carried down around the points of maximum signal intensity. The cautery was used to perform the dissection.   The specimen was inked with the margin marker paint kit.    Specimen radiography confirmed inclusion of the mammographic lesion, the clips, and the seeds.  Multiple surgical clips were present from her prior surgery.  The background signal in the breast was zero.  Hemostasis was achieved with cautery.  The cavity was marked with clips on each border other than the anterior border.  Due to her prior surgery and the size of this defect (3.2 cm) there was a reasonable size defect extending from the skin to the pectoralis muscle.  The adjacent breast tissue was freed up with  the cautery.  A counter incision was made to allow this to release medial to the nipple.  2-0 vicryl interrupted sutures were used to pull the underlying tissue together to alleviate the tissue defect.  The wound was reinspected for hemostasis.  The wound was irrigated and closed with 3-0 vicryl interrupted deep dermal sutures and 4-0 monocryl running subcuticular suture.      Sterile dressings were applied. At the end of the operation, all sponge, instrument, and needle counts were correct.   Findings: Seed, clip in specimen.  Anterior margin is skin and posterior margin is pectoralis.     Estimated Blood Loss:  min         Specimens: Right breast tissue with seeds         Complications:  None; patient tolerated the procedure well.         Disposition: PACU - hemodynamically stable.         Condition: stable

## 2024-04-23 ENCOUNTER — Encounter (HOSPITAL_COMMUNITY): Payer: Self-pay | Admitting: General Surgery

## 2024-04-24 LAB — SURGICAL PATHOLOGY

## 2024-04-26 ENCOUNTER — Ambulatory Visit: Payer: Self-pay | Admitting: General Surgery

## 2024-05-13 DIAGNOSIS — N61 Mastitis without abscess: Secondary | ICD-10-CM | POA: Diagnosis not present

## 2024-06-05 ENCOUNTER — Inpatient Hospital Stay: Attending: Hematology and Oncology | Admitting: Hematology and Oncology

## 2024-06-05 ENCOUNTER — Inpatient Hospital Stay

## 2024-06-05 VITALS — BP 167/68 | HR 73 | Temp 97.3°F | Resp 18 | Ht 60.0 in | Wt 113.5 lb

## 2024-06-05 DIAGNOSIS — N951 Menopausal and female climacteric states: Secondary | ICD-10-CM | POA: Diagnosis not present

## 2024-06-05 DIAGNOSIS — C50411 Malignant neoplasm of upper-outer quadrant of right female breast: Secondary | ICD-10-CM | POA: Diagnosis present

## 2024-06-05 DIAGNOSIS — M8580 Other specified disorders of bone density and structure, unspecified site: Secondary | ICD-10-CM | POA: Insufficient documentation

## 2024-06-05 DIAGNOSIS — Z170 Estrogen receptor positive status [ER+]: Secondary | ICD-10-CM | POA: Diagnosis not present

## 2024-06-05 DIAGNOSIS — Z79811 Long term (current) use of aromatase inhibitors: Secondary | ICD-10-CM | POA: Diagnosis not present

## 2024-06-05 MED ORDER — ANASTROZOLE 1 MG PO TABS: 1.0000 mg | ORAL_TABLET | Freq: Every day | ORAL | 3 refills | Status: DC

## 2024-06-05 NOTE — Assessment & Plan Note (Signed)
 05/03/2016: Right lumpectomy: Grade 2 IDC 1.3 cm with DCIS, LVI positive, 0/3 lymph nodes, ER 95% PR 60% HER2 negative Ki-67 20%, T1c N0 stage Ia (CHEK2 mutation): Refused radiation, could not tolerate tamoxifen   Recurrence: 04/14/2024: Right lumpectomy: Grade 2 IDC 2.4 cm, margins negative, ER 100%, PR 70%, Ki67 10%, HER2 negative   Pathology and radiology counseling: Discussed with the patient, the details of pathology including the type of breast cancer,the clinical staging, the significance of ER, PR and HER-2/neu receptors and the implications for treatment. After reviewing the pathology in detail, we proceeded to discuss the different treatment options between surgery, radiation, chemotherapy, antiestrogen therapies.  Recommendation: Observation
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
## 2024-06-06 ENCOUNTER — Encounter: Payer: Self-pay | Admitting: *Deleted

## 2024-06-19 ENCOUNTER — Other Ambulatory Visit: Payer: Self-pay | Admitting: Pulmonary Disease

## 2024-07-01 ENCOUNTER — Encounter: Payer: Self-pay | Admitting: Pulmonary Disease

## 2024-07-01 ENCOUNTER — Ambulatory Visit: Admitting: Pulmonary Disease

## 2024-07-01 VITALS — BP 160/73 | HR 79 | Temp 98.2°F | Ht 60.0 in | Wt 109.8 lb

## 2024-07-01 DIAGNOSIS — Z23 Encounter for immunization: Secondary | ICD-10-CM

## 2024-07-01 DIAGNOSIS — J479 Bronchiectasis, uncomplicated: Secondary | ICD-10-CM

## 2024-07-01 DIAGNOSIS — J4540 Moderate persistent asthma, uncomplicated: Secondary | ICD-10-CM | POA: Diagnosis not present

## 2024-07-01 MED ORDER — TRELEGY ELLIPTA 200-62.5-25 MCG/ACT IN AEPB: 1.0000 | INHALATION_SPRAY | Freq: Every day | RESPIRATORY_TRACT | 12 refills | Status: DC
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
## 2024-07-01 NOTE — Patient Instructions (Addendum)
 No changes in medication.  You can search on amaz[employer] for nebulizer and see if there is one there to order or can ask the company how long you have to wait until you are eligible for a new one.  Flu shot today  Return to clinic in 6 months or sooner as needed with Dr. Annella

## 2024-07-02 ENCOUNTER — Inpatient Hospital Stay: Attending: Hematology and Oncology | Admitting: Hematology and Oncology

## 2024-07-02 DIAGNOSIS — Z170 Estrogen receptor positive status [ER+]: Secondary | ICD-10-CM

## 2024-07-02 DIAGNOSIS — C50411 Malignant neoplasm of upper-outer quadrant of right female breast: Secondary | ICD-10-CM

## 2024-07-02 NOTE — Progress Notes (Signed)
 HEMATOLOGY-ONCOLOGY TELEPHONE VISIT PROGRESS NOTE  I connected with our patient on 07/02/24 at  3:00 PM EDT by telephone and verified that I am speaking with the correct person using two identifiers.  I discussed the limitations, risks, security and privacy concerns of performing an evaluation and management service by telephone and the availability of in person appointments.  I also discussed with the patient that there may be a patient responsible charge related to this service. The patient expressed understanding and agreed to proceed.   History of Present Illness: Follow-up to discuss anastrozole  therapy  History of Present Illness Carla [name] is a 89 year old female who presents with concerns about anastrozole  medication side effects and weight loss.  She discontinued anastrozole  two weeks ago due to severe gastrointestinal side effects, including bowel shutdown. Her bowel function has normalized since stopping the medication.  She has experienced significant weight loss due to decreased appetite and reduced food intake. She has been on antibiotics for approximately two and a half months for various infections, including a ruptured cancer site. A recent tooth infection required antibiotics last week.  Her blood pressure has been elevated over the past several months.    Oncology History  Breast cancer of upper-outer quadrant of right female breast (HCC)  04/05/2016 Initial Diagnosis   Right breast biopsy 12:00:IDC, LVI present, grade 2-3, ER 95%, PR 60%, HER-2 negative ratio 1.42, Ki-67 20%, screening detected right breast mass 7 mm axilla negative,T1bN0 stage IA clinical stage   04/13/2016 Cancer Staging   Staging form: Breast, AJCC 7th Edition - Clinical stage from 04/13/2016: Stage IIA (rT2, rN0, rM0) - Signed by Odean Potts, MD on 06/05/2024 Staged by: Managing physician Stage prefix: Recurrence Laterality: Right Estrogen receptor status: Positive Progesterone receptor status:  Positive HER2 status: Negative Stage used in treatment planning: Yes National guidelines used in treatment planning: Yes Type of national guideline used in treatment planning: NCCN Staging comments: Staged at breast conference on 7.19.17   05/03/2016 Surgery   Right lumpectomy: IDC grade 2, 1.3 cm, DCIS with calcifications and necrosis, lymphovascular invasion is identified, margins negative, 0/3 lymph nodes negative, ER 95%, PR 60%, HER-2 negative, Ki-67 20%, T1 cN0 stage IA   05/23/2016 - 09/10/2016 Anti-estrogen oral therapy   Tamoxifen  20 mg daily, stopped in December 2017 due to hot flashes patient refused to try any other medication because of osteoporosis   06/20/2016 Genetic Testing   CHEK2 positive. Genes tested include: ATM, BARD1, BRCA1, BRCA2, BRIP1, CDH1, CHEK2, EPCAM, FANCC, MLH1, MSH2, MSH6, NBN, PALB2, PMS2, PTEN, RAD51C, RAD51D, TP53, and XRCC2.      07/2023 Relapse/Recurrence   Dr.Arondondo excised Right breast mass in Nov 2024: 1.7 cm Grade 2 IDC ER/PR Pos and HER 2 Neg   04/22/2024 Surgery   Right lumpectomy: Grade 2 IDC 2.4 cm, margins negative, ER 100%, PR 70%, Ki67 10%, HER2 negative     REVIEW OF SYSTEMS:   Constitutional: Denies fevers, chills or abnormal weight loss All other systems were reviewed with the patient and are negative. Observations/Objective:     Assessment Plan:  Breast cancer of upper-outer quadrant of right female breast (HCC) 05/03/2016: Right lumpectomy: Grade 2 IDC 1.3 cm with DCIS, LVI positive, 0/3 lymph nodes, ER 95% PR 60% HER2 negative Ki-67 20%, T1c N0 stage Ia (CHEK2 mutation): Refused radiation, could not tolerate tamoxifen  Recurrence: November 2025: Right lumpectomy: Grade 2 IDC 1.7 cm, ER/PR positive HER2 negative (Dr. Marline) Recurrence: 04/14/2024: Right lumpectomy: Grade 2 IDC 2.4 cm,  margins negative, ER 100%, PR 70%, Ki67 10%, HER2 negative (Dr. Aron)   Recommendation: Since this is her third episode of breast cancer, she  elected to do antiestrogen therapy. She did not want to do radiation especially because of poor wound healing issues.   Anastrozole  toxicities: severe constipation: Recommended stopping anastrozole    RTC in 1 year   I discussed the assessment and treatment plan with the patient. The patient was provided an opportunity to ask questions and all were answered. The patient agreed with the plan and demonstrated an understanding of the instructions. The patient was advised to call back or seek an in-person evaluation if the symptoms worsen or if the condition fails to improve as anticipated.   I provided 20 minutes of non-face-to-face time during this encounter.  This includes time for charting and coordination of care   [name], MD

## 2024-07-02 NOTE — Assessment & Plan Note (Signed)
 05/03/2016: Right lumpectomy: Grade 2 IDC 1.3 cm with DCIS, LVI positive, 0/3 lymph nodes, ER 95% PR 60% HER2 negative Ki-67 20%, T1c N0 stage Ia (CHEK2 mutation): Refused radiation, could not tolerate tamoxifen  Recurrence: November 2025: Right lumpectomy: Grade 2 IDC 1.7 cm, ER/PR positive HER2 negative (Dr. Marline) Recurrence: 04/14/2024: Right lumpectomy: Grade 2 IDC 2.4 cm, margins negative, ER 100%, PR 70%, Ki67 10%, HER2 negative (Dr. Aron)   Recommendation: Since this is her third episode of breast cancer, she elected to do antiestrogen therapy. She did not want to do radiation especially because of poor wound healing issues.   Anastrozole  toxicities:    Telephone call in 1 month to discuss tolerance to anastrozole  therapy

## 2024-07-23 ENCOUNTER — Telehealth: Payer: Self-pay | Admitting: Internal Medicine

## 2024-07-23 DIAGNOSIS — J4540 Moderate persistent asthma, uncomplicated: Secondary | ICD-10-CM

## 2024-07-23 NOTE — Telephone Encounter (Signed)
 Copied from CRM #[id number]. Topic: Clinical - Prescription Issue >> Jul 23, 2024  3:21 PM Carla [name] wrote: Reason for CRM: pt's daughter called to ask about the pt's Rx Fluticasone -Umeclidin-Vilant (TRELEGY ELLIPTA ) 200-62.5-25 MCG/ACT AEPB Pt saw Dr Annella 10/06 and they still have not received.  We called the mail order [facility]            [facility] said it has been on hold b/c they have not been able to reach pt to update payment info. Daughter Carla [name] would now  like to know if you can please cancel script to [facility] and transfer the Rx to     [id number] - [place], [place] - [address]

## 2024-07-24 MED ORDER — TRELEGY ELLIPTA 200-62.5-25 MCG/ACT IN AEPB: 1.0000 | INHALATION_SPRAY | Freq: Every day | RESPIRATORY_TRACT | 4 refills | Status: AC

## 2024-07-24 NOTE — Addendum Note (Signed)
 Addended by: Lavina Resor K on: 07/24/2024 01:45 PM   Modules accepted: Orders

## 2024-07-24 NOTE — Telephone Encounter (Signed)
 Carla [name] and notified request cx at [facility] and rx sent to [facility]. NFN

## 2024-07-26 DIAGNOSIS — S51012A Laceration without foreign body of left elbow, initial encounter: Secondary | ICD-10-CM | POA: Diagnosis not present

## 2024-07-26 DIAGNOSIS — S0101XA Laceration without foreign body of scalp, initial encounter: Secondary | ICD-10-CM | POA: Diagnosis not present

## 2024-08-05 DIAGNOSIS — Z4802 Encounter for removal of sutures: Secondary | ICD-10-CM | POA: Diagnosis not present

## 2024-08-05 DIAGNOSIS — S51012D Laceration without foreign body of left elbow, subsequent encounter: Secondary | ICD-10-CM | POA: Diagnosis not present

## 2024-08-05 DIAGNOSIS — S0101XD Laceration without foreign body of scalp, subsequent encounter: Secondary | ICD-10-CM | POA: Diagnosis not present

## 2024-08-26 DIAGNOSIS — Z170 Estrogen receptor positive status [ER+]: Secondary | ICD-10-CM | POA: Diagnosis not present

## 2024-08-26 DIAGNOSIS — N6459 Other signs and symptoms in breast: Secondary | ICD-10-CM | POA: Diagnosis not present

## 2024-08-26 DIAGNOSIS — Z1589 Genetic susceptibility to other disease: Secondary | ICD-10-CM | POA: Diagnosis not present

## 2024-08-26 DIAGNOSIS — C50411 Malignant neoplasm of upper-outer quadrant of right female breast: Secondary | ICD-10-CM | POA: Diagnosis not present

## 2024-08-27 ENCOUNTER — Other Ambulatory Visit: Payer: Self-pay | Admitting: General Surgery

## 2024-08-27 DIAGNOSIS — Z170 Estrogen receptor positive status [ER+]: Secondary | ICD-10-CM

## 2024-08-27 DIAGNOSIS — Z1589 Genetic susceptibility to other disease: Secondary | ICD-10-CM

## 2024-08-28 ENCOUNTER — Other Ambulatory Visit: Payer: Self-pay | Admitting: General Surgery

## 2024-08-28 DIAGNOSIS — C50411 Malignant neoplasm of upper-outer quadrant of right female breast: Secondary | ICD-10-CM

## 2024-08-28 DIAGNOSIS — Z170 Estrogen receptor positive status [ER+]: Secondary | ICD-10-CM

## 2024-08-28 DIAGNOSIS — Z1589 Genetic susceptibility to other disease: Secondary | ICD-10-CM

## 2024-10-01 ENCOUNTER — Ambulatory Visit
Admission: RE | Admit: 2024-10-01 | Discharge: 2024-10-01 | Disposition: A | Discharge status: Home / Self Care | Source: Ambulatory Visit | Attending: General Surgery | Admitting: General Surgery

## 2024-10-01 DIAGNOSIS — Z170 Estrogen receptor positive status [ER+]: Secondary | ICD-10-CM

## 2024-10-01 DIAGNOSIS — Z1589 Genetic susceptibility to other disease: Secondary | ICD-10-CM

## 2024-10-01 DIAGNOSIS — C50411 Malignant neoplasm of upper-outer quadrant of right female breast: Secondary | ICD-10-CM
